# Patient Record
Sex: Female | Born: 1975 | Race: Black or African American | Hispanic: No | Marital: Single | Smoking: Former smoker
Health system: Southern US, Community
[De-identification: ages and names within clinical notes are randomized; demographics above are authoritative.]

## PROBLEM LIST (undated history)

## (undated) DIAGNOSIS — E785 Hyperlipidemia, unspecified: Secondary | ICD-10-CM

## (undated) DIAGNOSIS — R202 Paresthesia of skin: Secondary | ICD-10-CM

## (undated) DIAGNOSIS — R519 Headache, unspecified: Secondary | ICD-10-CM

## (undated) DIAGNOSIS — B009 Herpesviral infection, unspecified: Secondary | ICD-10-CM

## (undated) DIAGNOSIS — R079 Chest pain, unspecified: Secondary | ICD-10-CM

## (undated) DIAGNOSIS — R5383 Other fatigue: Secondary | ICD-10-CM

## (undated) DIAGNOSIS — R002 Palpitations: Secondary | ICD-10-CM

## (undated) DIAGNOSIS — F419 Anxiety disorder, unspecified: Secondary | ICD-10-CM

## (undated) DIAGNOSIS — M25559 Pain in unspecified hip: Secondary | ICD-10-CM

## (undated) DIAGNOSIS — Z91018 Allergy to other foods: Secondary | ICD-10-CM

## (undated) DIAGNOSIS — M1990 Unspecified osteoarthritis, unspecified site: Secondary | ICD-10-CM

## (undated) DIAGNOSIS — M25569 Pain in unspecified knee: Secondary | ICD-10-CM

## (undated) DIAGNOSIS — R51 Headache: Secondary | ICD-10-CM

## (undated) DIAGNOSIS — R42 Dizziness and giddiness: Secondary | ICD-10-CM

## (undated) DIAGNOSIS — M549 Dorsalgia, unspecified: Secondary | ICD-10-CM

## (undated) HISTORY — DX: Allergy to other foods: Z91.018

## (undated) HISTORY — DX: Paresthesia of skin: R20.2

## (undated) HISTORY — DX: Palpitations: R00.2

## (undated) HISTORY — DX: Dorsalgia, unspecified: M54.9

## (undated) HISTORY — PX: NO PAST SURGERIES: SHX2092

## (undated) HISTORY — DX: Unspecified osteoarthritis, unspecified site: M19.90

## (undated) HISTORY — DX: Pain in unspecified knee: M25.569

## (undated) HISTORY — DX: Anxiety disorder, unspecified: F41.9

## (undated) HISTORY — DX: Chest pain, unspecified: R07.9

## (undated) HISTORY — DX: Pain in unspecified hip: M25.559

## (undated) HISTORY — DX: Hyperlipidemia, unspecified: E78.5

## (undated) HISTORY — DX: Headache: R51

## (undated) HISTORY — DX: Other fatigue: R53.83

## (undated) HISTORY — DX: Dizziness and giddiness: R42

## (undated) HISTORY — DX: Headache, unspecified: R51.9

---

## 2001-04-12 ENCOUNTER — Other Ambulatory Visit: Admission: RE | Admit: 2001-04-12 | Discharge: 2001-04-12 | Payer: Self-pay | Admitting: Obstetrics and Gynecology

## 2006-03-22 ENCOUNTER — Encounter (INDEPENDENT_AMBULATORY_CARE_PROVIDER_SITE_OTHER): Payer: Self-pay | Admitting: Specialist

## 2006-03-22 ENCOUNTER — Inpatient Hospital Stay (HOSPITAL_COMMUNITY): Admission: AD | Admit: 2006-03-22 | Discharge: 2006-03-22 | Payer: Self-pay | Admitting: Obstetrics and Gynecology

## 2010-02-02 NOTE — L&D Delivery Note (Signed)
Delivery Note At 6:15 PM a viable and healthy female was delivered via  (ROA).  APGAR: 9,9, ; weight .  7lb 13 oz Placenta status: ,intact, spont .  Cord:  with the following complications: .none  Cord pH: none Anesthesia:  none Episiotomy: none Lacerations: 1 st degree perineal and right periurethral Suture Repair: 3.0 chromic Est. Blood Loss (mL): 250cc  Mom to postpartum.  Baby to nursery-stable.  [name],Kathy A 10/04/2010, 6:32 PM

## 2010-02-25 ENCOUNTER — Ambulatory Visit (HOSPITAL_COMMUNITY)
Admission: RE | Admit: 2010-02-25 | Discharge: 2010-02-25 | Payer: Self-pay | Source: Home / Self Care | Attending: Obstetrics | Admitting: Obstetrics

## 2010-02-28 LAB — ABO/RH

## 2010-02-28 LAB — RPR: RPR: NONREACTIVE

## 2010-03-03 ENCOUNTER — Other Ambulatory Visit: Payer: Self-pay | Admitting: Obstetrics and Gynecology

## 2010-09-04 LAB — STREP B DNA PROBE: GBS: NEGATIVE

## 2010-10-04 ENCOUNTER — Inpatient Hospital Stay (HOSPITAL_COMMUNITY)
Admission: AD | Admit: 2010-10-04 | Discharge: 2010-10-06 | DRG: 775 | Disposition: A | Discharge status: Home / Self Care | Payer: Managed Care, Other (non HMO) | Source: Ambulatory Visit | Attending: Obstetrics and Gynecology | Admitting: Obstetrics and Gynecology

## 2010-10-04 ENCOUNTER — Encounter (HOSPITAL_COMMUNITY): Payer: Self-pay

## 2010-10-04 DIAGNOSIS — O480 Post-term pregnancy: Principal | ICD-10-CM | POA: Diagnosis present

## 2010-10-04 DIAGNOSIS — IMO0002 Reserved for concepts with insufficient information to code with codable children: Secondary | ICD-10-CM | POA: Diagnosis present

## 2010-10-04 HISTORY — DX: Herpesviral infection, unspecified: B00.9

## 2010-10-04 LAB — RPR: RPR Ser Ql: NONREACTIVE

## 2010-10-04 LAB — CBC
HCT: 35.4 % — ABNORMAL LOW (ref 36.0–46.0)
Hemoglobin: 11.8 g/dL — ABNORMAL LOW (ref 12.0–15.0)
MCHC: 33.3 g/dL (ref 30.0–36.0)
RBC: 4.23 MIL/uL (ref 3.87–5.11)

## 2010-10-04 MED ORDER — WITCH HAZEL-GLYCERIN EX PADS: 1.0000 "application " | MEDICATED_PAD | CUTANEOUS | Status: DC | PRN

## 2010-10-04 MED ORDER — OXYTOCIN BOLUS FROM INFUSION
500.0000 mL | Freq: Once | INTRAVENOUS | Status: DC
  Filled 2010-10-04: qty 500

## 2010-10-04 MED ORDER — LIDOCAINE HCL (PF) 1 % IJ SOLN
30.0000 mL | INTRAMUSCULAR | Status: DC | PRN
  Filled 2010-10-04 (×2): qty 30

## 2010-10-04 MED ORDER — OXYTOCIN 20 UNITS IN LACTATED RINGERS INFUSION - SIMPLE
1.0000 m[IU]/min | INTRAVENOUS | Status: DC
  Administered 2010-10-04: 2 m[IU]/min via INTRAVENOUS

## 2010-10-04 MED ORDER — OXYTOCIN 20 UNITS IN LACTATED RINGERS INFUSION - SIMPLE
125.0000 mL/h | Freq: Once | INTRAVENOUS | Status: DC
  Filled 2010-10-04: qty 1000

## 2010-10-04 MED ORDER — OXYTOCIN 10 UNIT/ML IJ SOLN: 10.0000 [IU] | Freq: Once | INTRAMUSCULAR | Status: DC

## 2010-10-04 MED ORDER — IBUPROFEN 600 MG PO TABS
600.0000 mg | ORAL_TABLET | Freq: Four times a day (QID) | ORAL | Status: DC | PRN
  Administered 2010-10-04: 600 mg via ORAL
  Filled 2010-10-04 (×2): qty 1

## 2010-10-04 MED ORDER — OXYCODONE-ACETAMINOPHEN 5-325 MG PO TABS: 2.0000 | ORAL_TABLET | ORAL | Status: DC | PRN

## 2010-10-04 MED ORDER — IBUPROFEN 600 MG PO TABS
600.0000 mg | ORAL_TABLET | Freq: Four times a day (QID) | ORAL | Status: DC
  Administered 2010-10-05 – 2010-10-06 (×6): 600 mg via ORAL
  Filled 2010-10-04 (×5): qty 1

## 2010-10-04 MED ORDER — BENZOCAINE-MENTHOL 20-0.5 % EX AERO
1.0000 "application " | INHALATION_SPRAY | CUTANEOUS | Status: DC | PRN
  Administered 2010-10-05: 1 via TOPICAL

## 2010-10-04 MED ORDER — CITRIC ACID-SODIUM CITRATE 334-500 MG/5ML PO SOLN: 30.0000 mL | ORAL | Status: DC | PRN

## 2010-10-04 MED ORDER — NALBUPHINE SYRINGE 5 MG/0.5 ML
10.0000 mg | INJECTION | INTRAMUSCULAR | Status: DC | PRN
  Filled 2010-10-04: qty 1

## 2010-10-04 MED ORDER — DIPHENHYDRAMINE HCL 25 MG PO CAPS: 25.0000 mg | ORAL_CAPSULE | Freq: Four times a day (QID) | ORAL | Status: DC | PRN

## 2010-10-04 MED ORDER — LACTATED RINGERS IV SOLN
INTRAVENOUS | Status: DC
  Administered 2010-10-04: 15:00:00 via INTRAVENOUS

## 2010-10-04 MED ORDER — MISOPROSTOL 25 MCG QUARTER TABLET: 25.0000 ug | ORAL_TABLET | ORAL | Status: DC | PRN

## 2010-10-04 MED ORDER — ZOLPIDEM TARTRATE 10 MG PO TABS: 10.0000 mg | ORAL_TABLET | Freq: Every evening | ORAL | Status: DC | PRN

## 2010-10-04 MED ORDER — ONDANSETRON HCL 4 MG/2ML IJ SOLN: 4.0000 mg | Freq: Four times a day (QID) | INTRAMUSCULAR | Status: DC | PRN

## 2010-10-04 MED ORDER — ONDANSETRON HCL 4 MG/2ML IJ SOLN: 4.0000 mg | INTRAMUSCULAR | Status: DC | PRN

## 2010-10-04 MED ORDER — SIMETHICONE 80 MG PO CHEW: 80.0000 mg | CHEWABLE_TABLET | ORAL | Status: DC | PRN

## 2010-10-04 MED ORDER — PRENATAL PLUS 27-1 MG PO TABS
1.0000 | ORAL_TABLET | Freq: Every day | ORAL | Status: DC
  Administered 2010-10-05: 1 via ORAL
  Filled 2010-10-04: qty 1

## 2010-10-04 MED ORDER — TERBUTALINE SULFATE 1 MG/ML IJ SOLN: 0.2500 mg | Freq: Once | INTRAMUSCULAR | Status: AC | PRN

## 2010-10-04 MED ORDER — PRENATAL PLUS 27-1 MG PO TABS: 1.0000 | ORAL_TABLET | Freq: Every day | ORAL | Status: DC

## 2010-10-04 MED ORDER — ACETAMINOPHEN 325 MG PO TABS: 650.0000 mg | ORAL_TABLET | ORAL | Status: DC | PRN

## 2010-10-04 MED ORDER — ONDANSETRON HCL 4 MG PO TABS: 4.0000 mg | ORAL_TABLET | ORAL | Status: DC | PRN

## 2010-10-04 MED ORDER — SENNOSIDES-DOCUSATE SODIUM 8.6-50 MG PO TABS
2.0000 | ORAL_TABLET | Freq: Every day | ORAL | Status: DC
  Administered 2010-10-04 – 2010-10-05 (×2): 2 via ORAL

## 2010-10-04 MED ORDER — OXYCODONE-ACETAMINOPHEN 5-325 MG PO TABS: 1.0000 | ORAL_TABLET | ORAL | Status: DC | PRN

## 2010-10-04 MED ORDER — DIBUCAINE 1 % RE OINT: 1.0000 "application " | TOPICAL_OINTMENT | RECTAL | Status: DC | PRN

## 2010-10-04 MED ORDER — LANOLIN HYDROUS EX OINT: TOPICAL_OINTMENT | CUTANEOUS | Status: DC | PRN

## 2010-10-04 MED ORDER — LACTATED RINGERS IV SOLN
500.0000 mL | INTRAVENOUS | Status: DC | PRN
Start: 2010-10-04 — End: 2010-10-05

## 2010-10-04 NOTE — Progress Notes (Signed)
Pt presents to [facility]U with complaints of ROM, 0615- Clear fluid.

## 2010-10-04 NOTE — H&P (Signed)
Kathy [name] is a 35 y.o. female presenting for admission due to SROM @ 6:15 am clear fluid and postdates. PNC uncomplicated. 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 HSV (herpes simplex virus) infection    Past Surgical History  Procedure Date  . No past surgeries    Family History: family history includes Cancer in her maternal grandfather, maternal grandmother, and mother and Diabetes in her maternal grandfather, maternal grandmother, paternal grandfather, and paternal grandmother. Social History:  reports that she has quit smoking. She has never used smokeless tobacco. She reports that she does not drink alcohol or use illicit drugs.  Review of Systems  Constitutional: Negative.   Genitourinary: Negative.   Neurological: Negative.   All other systems reviewed and are negative.    Dilation: 2 Effacement (%): 50;Thick Station: -2 Exam by:: J. Rasch RN  Blood pressure 125/84, pulse 79, temperature 97.8 F (36.6 C), temperature source Oral, resp. rate 18, height 5\' 5"  (1.651 m), weight 94.348 kg (208 lb). Maternal Exam:  Uterine Assessment: Contraction strength is mild.  Contraction frequency is irregular.   Abdomen: Estimated fetal weight is 8 1/2 lb.   Fetal presentation: vertex     Fetal Exam Fetal Monitor Review: Mode: ultrasound.   Variability: minimal (<5 bpm).   Pattern: accelerations present and no decelerations.    Fetal State Assessment: Category I - tracings are normal.     Physical Exam  Constitutional: She is oriented to person, place, and time. She appears well-developed and well-nourished.  HENT:  Head: Normocephalic.  Eyes: EOM are normal.  Neck: Neck supple.  Cardiovascular: Regular rhythm.   Respiratory: Breath sounds normal.  GI: Soft.  Musculoskeletal: She  exhibits edema.       1 +  Neurological: She is alert and oriented to person, place, and time.  Skin: Skin is warm and dry.  Psychiatric: She has a normal mood and affect.    Prenatal labs: ABO, Rh: O/Positive/-- (01/27 0000) Antibody: Negative (01/27 0000) Rubella:  Immune RPR: Nonreactive (01/27 0000)  HBsAg:   neg HIV: Non-reactive (01/27 0000)  GBS: Negative (08/02 0000)   Assessment/Plan: SROM Postdates P) Admit  Routine labs epidural prn  Low dose pitocin   COUSINS,SHERONETTE A 10/04/2010, 11:14 AM

## 2010-10-04 NOTE — Progress Notes (Signed)
Kathy [name] is a 35 y.o. [phone] at [redacted]w[redacted]d by ultrasound admitted for rupture of membranes  Subjective:   Objective: BP 113/63  Pulse 94  Temp(Src) 98.6 F (37 C) (Oral)  Resp 18  Ht 5\' 5"  (1.651 m)  Wt 94.348 kg (208 lb)  BMI 34.61 kg/m2  SpO2 100%      FHT:  FHR: 145 bpm, variability: moderate,  accelerations:  Present,  decelerations:  Absent UC:   regular, every 2 minutes SVE:   Dilation: 9 Effacement (%): 100 Station: +1 Exam by:: Thomasene Lot, RN  Labs: Lab Results  Component Value Date   WBC 12.2* 10/04/2010   HGB 11.8* 10/04/2010   HCT 35.4* 10/04/2010   MCV 83.7 10/04/2010   PLT 172 10/04/2010    Assessment / Plan: Spontaneous labor, progressing normally  Labor: Progressing normally and urge to push Preeclampsia:  no signs or symptoms of toxicity Fetal Wellbeing:  Category I Pain Control:  Labor support without medications I/D:  n/a Anticipated MOD:  NSVD  Denver Harder A 10/04/2010, 5:29 PM

## 2010-10-04 NOTE — Progress Notes (Signed)
Woke up and was a little wet went to bathroom and fluid kept coming out with mucus discharge kept dripping, when stood up more came out, no pain.

## 2010-10-04 NOTE — Progress Notes (Signed)
Kathy [name] is [name] 35 y.o. [phone] at [redacted]w[redacted]d by ultrasound admitted for rupture of membranes  Subjective:   Objective: BP 123/77  Pulse 87  Temp(Src) 98.6 F (37 C) (Oral)  Resp 18  Ht 5\' 5"  (1.651 m)  Wt 94.348 kg (208 lb)  BMI 34.61 kg/m2  SpO2 100%      FHT:  FHR: 140's bpm, variability: moderate,  accelerations:  Present,  decelerations:  Absent UC:   regular, every 2 minutes SVE:   Dilation: 4 Effacement (%): 80 Station: -2 Exam by:: Dr. Cherly [name]  Labs: Lab Results  Component Value Date   WBC 12.2* 10/04/2010   HGB 11.8* 10/04/2010   HCT 35.4* 10/04/2010   MCV 83.7 10/04/2010   PLT 172 10/04/2010    Assessment / Plan: Spontaneous labor, progressing normally  Labor: Progressing normally Preeclampsia:  no signs or symptoms of toxicity Fetal Wellbeing:  Category I Pain Control:  Labor support without medications I/D:  n/[name] Anticipated MOD:  NSVD  Kathy [name] 10/04/2010, 3:52 PM

## 2010-10-04 NOTE — Progress Notes (Signed)
FHR not tracing at interveals while pt up walking.  Whenever pt returns to room FHR begins tracing

## 2010-10-04 NOTE — Progress Notes (Signed)
EFM not tracing well due to pt walking .

## 2010-10-05 ENCOUNTER — Encounter (HOSPITAL_COMMUNITY): Payer: Self-pay

## 2010-10-05 LAB — CBC
Hemoglobin: 11.1 g/dL — ABNORMAL LOW (ref 12.0–15.0)
MCV: 83.5 fL (ref 78.0–100.0)
Platelets: 152 10*3/uL (ref 150–400)
RBC: 3.94 MIL/uL (ref 3.87–5.11)
WBC: 22.1 10*3/uL — ABNORMAL HIGH (ref 4.0–10.5)

## 2010-10-05 MED ORDER — BENZOCAINE-MENTHOL 20-0.5 % EX AERO
INHALATION_SPRAY | CUTANEOUS | Status: AC
  Administered 2010-10-05: 1 via TOPICAL
  Filled 2010-10-05: qty 56

## 2010-10-05 NOTE — Progress Notes (Signed)
Post Partum Day #1             Subjective:   Pt reports feeling well, up to shower this AM/ Tolerating po/ Voiding without problems/ No n/v/ Bleeding is moderate/ Pain controlled with ibuprofen, breastfeeding well  Newborn info:  Information for the patient's newborn:  Lillyonna, Armstead [[id number]]  female    Objective:     VS: Blood pressure 116/73, pulse 69, temperature 98.4 F (36.9 C), temperature source Oral, resp. rate 18, height 5\' 5"  (1.651 m), weight 208 lb (94.348 kg), SpO2 100.00%, currently breastfeeding.   Intake/Output Summary (Last 24 hours) at 10/05/10 1008 Last data filed at 10/04/10 1905  Gross per 24 hour  Intake      0 ml  Output    300 ml  Net   -300 ml        Basename 10/05/10 0515 10/04/10 0900  WBC 22.1* 12.2*  HGB 11.1* 11.8*  HCT 32.9* 35.4*  PLT 152 172     Blood type: O/Positive/-- (01/27 0000)  Rubella: Immune (01/27 0000)     Physical Exam:   A & O x 3 NAD   Lungs: CTAB  Heart: RR  Abdomen: soft, non-tender, FF @ U  Perineum: repair intact, edema mild  Lochia: small  Extremities: neg Homans', edema none    Assessment/Plan:  PPD # 1 / 35 y.o., [id number] S/P:spontaneous vaginal    normal postpartum exam  Doing well  Continue routine post partum orders  Anticipate d/c in AM, may decide to go home this eve if infant discharged         LOS: 1 day   Avrey Hyser, CNM, MSN 10/05/2010, 10:08 AM

## 2010-10-06 MED ORDER — TETANUS-DIPHTH-ACELL PERTUSSIS 5-2.5-18.5 LF-MCG/0.5 IM SUSP
0.5000 mL | Freq: Once | INTRAMUSCULAR | Status: AC
  Administered 2010-10-06: 0.5 mL via INTRAMUSCULAR

## 2010-10-06 MED ORDER — IBUPROFEN 600 MG PO TABS: 600.0000 mg | ORAL_TABLET | Freq: Four times a day (QID) | ORAL | Status: AC

## 2010-10-06 NOTE — Discharge Summary (Signed)
Obstetric Discharge Summary Reason for Admission: onset of labor and rupture of membranes Prenatal Procedures: ultrasound Intrapartum Procedures: spontaneous vaginal delivery Postpartum Procedures: Tdap Complications-Operative and Postpartum: 1st degree perineal laceration and periurethral Hemoglobin  Date Value Range Status  10/05/2010 11.1* 12.0-15.0 (g/dL) Final     HCT  Date Value Range Status  10/05/2010 32.9* 36.0-46.0 (%) Final    Discharge Diagnoses: Term Pregnancy-delivered  Discharge Information: Date: 10/06/2010 Activity: pelvic rest Diet: routine Medications: Ibuprophen Condition: stable Instructions: refer to practice specific booklet Discharge to: home Follow-up Information    Follow up with Makaleigh Reinard A, MD in 6 weeks.   Contact information:   [address] [place] [address] [phone]          Newborn Data: Live born female  Birth Weight: 7 lb 13.2 oz (3549 g) APGAR: 9, 9  Home with mother.  Kathy [name],Kathy [name] 10/06/2010, 9:14 AM

## 2010-10-06 NOTE — Progress Notes (Signed)
Post Partum Day #2           Information for the patient's newborn:  Alaney, Witter [[id number]]  female    Subjective: No HA, SOB, CP, F/C, breast symptoms. Pain minimal. Normal vaginal bleeding, no clots.    Feeding: breast  Objective: BP 108/72  Pulse 66  Temp(Src) 98.2 F (36.8 C) (Oral)  Resp 18  Ht 5\' 5"  (1.651 m)  Wt 208 lb (94.348 kg)  BMI 34.61 kg/m2  SpO2 100%  Breastfeeding? Unknown  No intake or output data in the 24 hours ending 10/06/10 0912     Basename 10/05/10 0515 10/04/10 0900  WBC 22.1* 12.2*  HGB 11.1* 11.8*  HCT 32.9* 35.4*  PLT 152 172    Blood type: O/Positive/-- (01/27 0000) Rubella: Immune (01/27 0000)    Physical Exam:  General: alert, cooperative and no distress Uterine Fundus: firm Lochia: appropriate Perineum: repair intact, edema none DVT Evaluation: Negative Homan's sign. No significant calf/ankle edema.    Assessment/Plan: PPD # 2 / 35 y.o., [id number] S/P:spontaneous vaginal    normal postpartum exam  Continue current postpartum care  D/C home   LOS: 2 days   Blakleigh Straw, CNM, MSN 10/06/2010, 9:12 AM

## 2010-10-07 NOTE — Progress Notes (Signed)
UR chart review completed.  

## 2013-12-04 ENCOUNTER — Encounter (HOSPITAL_COMMUNITY): Payer: Self-pay

## 2014-08-12 ENCOUNTER — Ambulatory Visit (INDEPENDENT_AMBULATORY_CARE_PROVIDER_SITE_OTHER): Payer: BLUE CROSS/BLUE SHIELD | Admitting: Physician Assistant

## 2014-08-12 VITALS — BP 100/58 | HR 72 | Temp 98.6°F | Resp 14 | Ht 65.25 in | Wt 195.0 lb

## 2014-08-12 DIAGNOSIS — J329 Chronic sinusitis, unspecified: Secondary | ICD-10-CM

## 2014-08-12 DIAGNOSIS — R21 Rash and other nonspecific skin eruption: Secondary | ICD-10-CM

## 2014-08-12 DIAGNOSIS — M549 Dorsalgia, unspecified: Secondary | ICD-10-CM

## 2014-08-12 LAB — POCT URINALYSIS DIPSTICK
Bilirubin, UA: NEGATIVE
Glucose, UA: NEGATIVE
Ketones, UA: NEGATIVE
LEUKOCYTES UA: NEGATIVE
NITRITE UA: NEGATIVE
PROTEIN UA: NEGATIVE
Spec Grav, UA: 1.015
Urobilinogen, UA: 0.2
pH, UA: 7

## 2014-08-12 LAB — POCT UA - MICROSCOPIC ONLY
CASTS, UR, LPF, POC: NEGATIVE
Crystals, Ur, HPF, POC: NEGATIVE
Mucus, UA: NEGATIVE
YEAST UA: NEGATIVE

## 2014-08-12 MED ORDER — AMOXICILLIN-POT CLAVULANATE 875-125 MG PO TABS: 1.0000 | ORAL_TABLET | Freq: Two times a day (BID) | ORAL | Status: AC

## 2014-08-12 MED ORDER — CETIRIZINE HCL 10 MG PO TABS: 10.0000 mg | ORAL_TABLET | Freq: Every day | ORAL | Status: DC

## 2014-08-12 NOTE — Patient Instructions (Addendum)
Please use over the counter nasal saline for the nostrils once per day. Please take ibuprofen or tylenol for your back pain.

## 2014-08-12 NOTE — Progress Notes (Signed)
[facility] [address], [place] [address] [phone]  Date:  08/12/2014   Name:  Kathy [name]   DOB:  February 13, 1975   MRN:  [id number]  PCP:  No primary care provider on file.    History of Present Illness:  Kathy [name] is a 39 y.o. female patient who present to [facility] 6 days ago of sneezing.  She thought she was getting better, but 5 days later she developed rhinorrhea and malaise, and nasal  pressure.  There is throat pain with right side.  She constantly feels fatigue.  She has a slight non-productive cough, but not consistent.  She states that she will blow her nose with a thick yellow and bloody mucus.  She denies fever, but has been having fits of fever.  She has no nausea, vomiting, or diarrhea.   She also complains of swelling at the back of her mouth intermittently.  It is not painful today.  She states that she thinks she gets stones sometimes.  She has no dyspnea, or sob.     She also complains that she is having symptoms of mid back pain along both sides.  She denies dysuria, frequency, or hematuria.  She has taken vitamin C and multivitamins.      There are no active problems to display for this patient.   Past Medical History  Diagnosis Date  . HSV (herpes simplex virus) infection   . Postpartum care following vaginal delivery 10/05/2010    Past Surgical History  Procedure Laterality Date  . No past surgeries      History  Substance Use Topics  . Smoking status: Former Smoker -- 12 years  . Smokeless tobacco: Never Used  . Alcohol Use: No    Family History  Problem Relation Age of Onset  . Cancer Mother   . Cancer Maternal Grandmother   . Diabetes Maternal Grandmother   . Diabetes Maternal Grandfather   . Cancer Maternal Grandfather   . Diabetes Paternal Grandmother   . Diabetes Paternal Grandfather     No Known Allergies  Medication list has been reviewed and updated.  Current Outpatient Prescriptions on File Prior to  Visit  Medication Sig Dispense Refill  . prenatal vitamin w/FE, FA (PRENATAL 1 + 1) 27-1 MG TABS Take 1 tablet by mouth daily.       No current facility-administered medications on file prior to visit.    ROS ROS otherwise unremarkable unless listed above.   Physical Examination: BP 100/58 mmHg  Pulse 72  Temp(Src) 98.6 F (37 C) (Oral)  Resp 14  Ht 5' 5.25" (1.657 m)  Wt 195 lb (88.451 kg)  BMI 32.21 kg/m2  SpO2 99%  LMP 07/19/2014 Ideal Body Weight: Weight in (lb) to have BMI = 25: 151.1  Physical Exam  Constitutional: She appears well-developed and well-nourished. No distress.  HENT:  Head: Normocephalic and atraumatic.  Right Ear: Tympanic membrane, external ear and ear canal normal.  Left Ear: Tympanic membrane, external ear and ear canal normal.  Nose: Mucosal edema (Right nostril only) and rhinorrhea (Right nostril with rhinorrhea, and dried mucus along nasal ridge.  ) present. Right sinus exhibits no maxillary sinus tenderness and no frontal sinus tenderness. Left sinus exhibits no maxillary sinus tenderness and no frontal sinus tenderness.  Mouth/Throat: No oropharyngeal exudate or posterior oropharyngeal edema.  No erythema.  Debri seen at upper right salivary duct (hamulus), however no erythema present.    Eyes: Pupils are  equal, round, and reactive to light.  Skin: She is not diaphoretic.  Scant small raised erthematous patches, which appear more gathered near right nostril.  Right nostril dry with peeling.      Results for orders placed or performed in visit on 08/12/14  POCT UA - Microscopic Only  Result Value Ref Range   WBC, Ur, HPF, POC 0-3    RBC, urine, microscopic 0-3    Bacteria, U Microscopic 3+    Mucus, UA negative    Epithelial cells, urine per micros 5-10    Crystals, Ur, HPF, POC negative    Casts, Ur, LPF, POC negative    Yeast, UA negative   POCT urinalysis dipstick  Result Value Ref Range   Color, UA yellow    Clarity, UA clear     Glucose, UA negative    Bilirubin, UA negative    Ketones, UA negative    Spec Grav, UA 1.015    Blood, UA trace-lysed    pH, UA 7.0    Protein, UA negative    Urobilinogen, UA 0.2    Nitrite, UA negative    Leukocytes, UA Negative Negative    Assessment and Plan: 39 year old female is here today for right sinus pressure, right sided throat, back pain.  Symptoms appear similar to sinusitis likely at ethmoid.  Given augmentin and zyrtec.  Back pain likely with sleeping situation.  Advised tylenol for back pain.  Urine is normal at this time.  Advised to use tart candies such as lemon drops, to help clear salivary glands.    Rash and nonspecific skin eruption - Plan: cetirizine (ZYRTEC) 10 MG tablet, amoxicillin-clavulanate (AUGMENTIN) 875-125 MG per tablet  Sinusitis, unspecified chronicity, unspecified location - Plan: amoxicillin-clavulanate (AUGMENTIN) 875-125 MG per tablet  Mid back pain - Plan: POCT UA - Microscopic Only, POCT urinalysis dipstick  Trena PlattStephanie English, PA-C [facility] 7/10/20163:42 PM

## 2014-11-01 ENCOUNTER — Telehealth: Payer: Self-pay | Admitting: Behavioral Health

## 2014-11-01 NOTE — Telephone Encounter (Signed)
Unable to reach patient at time of Pre-Visit Call.  Left message for patient to return call when available.    

## 2014-11-02 ENCOUNTER — Encounter: Payer: Self-pay | Admitting: Family Medicine

## 2014-11-02 ENCOUNTER — Ambulatory Visit (INDEPENDENT_AMBULATORY_CARE_PROVIDER_SITE_OTHER): Payer: BLUE CROSS/BLUE SHIELD | Admitting: Family Medicine

## 2014-11-02 VITALS — BP 114/60 | HR 68 | Temp 99.1°F | Ht 65.0 in | Wt 196.0 lb

## 2014-11-02 DIAGNOSIS — Z8619 Personal history of other infectious and parasitic diseases: Secondary | ICD-10-CM

## 2014-11-02 DIAGNOSIS — Z0000 Encounter for general adult medical examination without abnormal findings: Secondary | ICD-10-CM | POA: Diagnosis not present

## 2014-11-02 DIAGNOSIS — R51 Headache: Secondary | ICD-10-CM

## 2014-11-02 DIAGNOSIS — R519 Headache, unspecified: Secondary | ICD-10-CM

## 2014-11-02 LAB — LIPID PANEL
CHOLESTEROL: 156 mg/dL (ref 125–200)
HDL: 49 mg/dL (ref >=46)
LDL CALC: 90 mg/dL (ref <130)
TRIGLYCERIDES: 87 mg/dL (ref <150)
Total CHOL/HDL Ratio: 3.2 Ratio (ref <=5.0)
VLDL: 17 mg/dL (ref <30)

## 2014-11-02 LAB — CBC WITH DIFFERENTIAL/PLATELET
BASOS PCT: 0 % (ref 0–1)
Basophils Absolute: 0 10*3/uL (ref 0.0–0.1)
EOS ABS: 0.1 10*3/uL (ref 0.0–0.7)
Eosinophils Relative: 1 % (ref 0–5)
HCT: 38.3 % (ref 36.0–46.0)
HEMOGLOBIN: 12.6 g/dL (ref 12.0–15.0)
Lymphocytes Relative: 37 % (ref 12–46)
Lymphs Abs: 2.6 10*3/uL (ref 0.7–4.0)
MCH: 27 pg (ref 26.0–34.0)
MCHC: 32.9 g/dL (ref 30.0–36.0)
MCV: 82.2 fL (ref 78.0–100.0)
MPV: 11.5 fL (ref 8.6–12.4)
Monocytes Absolute: 0.4 10*3/uL (ref 0.1–1.0)
Monocytes Relative: 5 % (ref 3–12)
NEUTROS ABS: 4 10*3/uL (ref 1.7–7.7)
NEUTROS PCT: 57 % (ref 43–77)
PLATELETS: 206 10*3/uL (ref 150–400)
RBC: 4.66 MIL/uL (ref 3.87–5.11)
RDW: 14.3 % (ref 11.5–15.5)
WBC: 7.1 10*3/uL (ref 4.0–10.5)

## 2014-11-02 LAB — COMPREHENSIVE METABOLIC PANEL
ALBUMIN: 4.2 g/dL (ref 3.6–5.1)
ALT: 15 U/L (ref 6–29)
AST: 17 U/L (ref 10–30)
Alkaline Phosphatase: 28 U/L — ABNORMAL LOW (ref 33–115)
BILIRUBIN TOTAL: 0.4 mg/dL (ref 0.2–1.2)
BUN: 10 mg/dL (ref 7–25)
CHLORIDE: 103 mmol/L (ref 98–110)
CO2: 25 mmol/L (ref 20–31)
CREATININE: 0.78 mg/dL (ref 0.50–1.10)
Calcium: 8.9 mg/dL (ref 8.6–10.2)
Glucose, Bld: 87 mg/dL (ref 65–99)
Potassium: 4.4 mmol/L (ref 3.5–5.3)
SODIUM: 137 mmol/L (ref 135–146)
TOTAL PROTEIN: 6.9 g/dL (ref 6.1–8.1)

## 2014-11-02 LAB — SEDIMENTATION RATE: SED RATE: 4 mm/h (ref 0–20)

## 2014-11-02 MED ORDER — ZOLMITRIPTAN 5 MG PO TBDP: 5.0000 mg | ORAL_TABLET | ORAL | Status: DC | PRN

## 2014-11-02 NOTE — Progress Notes (Signed)
Pre visit review using our clinic review tool, if applicable. No additional management support is needed unless otherwise documented below in the visit note. 

## 2014-11-02 NOTE — Patient Instructions (Signed)

## 2014-11-02 NOTE — Progress Notes (Signed)
Patient ID: Kathy [name], female   DOB: [birth year], 39 y.o.   MRN: [id number]   Subjective:    Patient ID: Kathy [name], female    DOB: [birth year], 39 y.o.   MRN: [id number]  Chief Complaint  Patient presents with  . Establish Care    c/o Migraines with visual disturbance that started x's 1 mo ago.  Marland Kitchen Headache    and Dizziness while driving on 1/63, 8/46 and 9/18  . Tachycardia    and palpitaions. She had a previous work up.   Aldine Contes TRANSMITTED DISEASE    c/o having 2 false positive Test and she is concerned    HPI Patient is in today to establish/,    She had dizzy spells 10, 14, 18 of September and headaches on 5 and 2 other minor ones .    2 dizzy spells while driving and  headache on Tuesday that lasted from am - 4pm------and it went away on its own.  Pt is doing a fast with church so she thinks that particular headache was due to that.   Her vision is good with contacts.  Pt sometimes has a cough and sometimes her nose drips.  Headaches are usually on top of head left side.  Sometimes she takes tylenol and it helps-- when she had the migraine she saw a worm light on r side of vision.      Past Medical History  Diagnosis Date  . HSV (herpes simplex virus) infection   . Postpartum care following vaginal delivery 10/05/2010    Past Surgical History  Procedure Laterality Date  . No past surgeries      Family History  Problem Relation Age of Onset  . Lymphoma Mother   . Breast cancer Maternal Grandmother   . Diabetes Maternal Grandmother   . Diabetes Maternal Grandfather   . Cancer Maternal Grandfather     Unsure if Prostate or colon  . Diabetes Paternal Grandmother   . Diabetes Paternal Grandfather     Social History   Social History  . Marital Status: Single    Spouse Name: N/A  . Number of Children: N/A  . Years of Education: N/A   Occupational History  . Not on file.   Social History Main Topics  . Smoking status: Former Smoker -- 12 years  .  Smokeless tobacco: Never Used  . Alcohol Use: No  . Drug Use: No  . Sexual Activity: Yes   Other Topics Concern  . Not on file   Social History Narrative    Outpatient Prescriptions Prior to Visit  Medication Sig Dispense Refill  . norgestimate-ethinyl estradiol (MONONESSA) 0.25-35 MG-MCG tablet Take 1 tablet by mouth daily.    . valACYclovir (VALTREX) 500 MG tablet Take 500 mg by mouth daily.    . cetirizine (ZYRTEC) 10 MG tablet Take 1 tablet (10 mg total) by mouth daily. 30 tablet 11  . prenatal vitamin w/FE, FA (PRENATAL 1 + 1) 27-1 MG TABS Take 1 tablet by mouth daily.       No facility-administered medications prior to visit.    No Known Allergies  Review of Systems  Constitutional: Negative for fever, chills and malaise/fatigue.  HENT: Negative for congestion and hearing loss.   Eyes: Negative for discharge.  Respiratory: Negative for cough, sputum production and shortness of breath.   Cardiovascular: Negative for chest pain, palpitations and leg swelling.  Gastrointestinal: Negative for heartburn, nausea, vomiting, abdominal pain, diarrhea, constipation and blood in stool.  Genitourinary: Negative for dysuria, urgency, frequency and hematuria.  Musculoskeletal: Negative for myalgias, back pain and falls.  Skin: Negative for rash.  Neurological: Negative for dizziness, sensory change, loss of consciousness, weakness and headaches.  Endo/Heme/Allergies: Negative for environmental allergies. Does not bruise/bleed easily.  Psychiatric/Behavioral: Negative for depression and suicidal ideas. The patient is not nervous/anxious and does not have insomnia.        Objective:    Physical Exam  Constitutional: She is oriented to person, place, and time. She appears well-developed and well-nourished.  HENT:  Head: Normocephalic and atraumatic.  Eyes: Conjunctivae and EOM are normal.  Neck: Normal range of motion. Neck supple. No JVD present. Carotid bruit is not present. No  thyromegaly present.  Cardiovascular: Normal rate, regular rhythm and normal heart sounds.   No murmur heard. Pulmonary/Chest: Effort normal and breath sounds normal. No respiratory distress. She has no wheezes. She has no rales. She exhibits no tenderness.  Musculoskeletal: She exhibits no edema.  Neurological: She is alert and oriented to person, place, and time.  Psychiatric: She has a normal mood and affect.  Nursing note and vitals reviewed.   BP 114/60 mmHg  Pulse 68  Temp(Src) 99.1 F (37.3 C) (Oral)  Ht $R'5\' 5"'jy$  (1.651 m)  Wt 196 lb (88.905 kg)  BMI 32.62 kg/m2  SpO2 99%  LMP 10/10/2014 Wt Readings from Last 3 Encounters:  11/02/14 196 lb (88.905 kg)  08/12/14 195 lb (88.451 kg)  10/04/10 208 lb (94.348 kg)     Lab Results  Component Value Date   WBC 7.1 11/02/2014   HGB 12.6 11/02/2014   HCT 38.3 11/02/2014   PLT 206 11/02/2014    No results found for: TSH Lab Results  Component Value Date   WBC 7.1 11/02/2014   HGB 12.6 11/02/2014   HCT 38.3 11/02/2014   MCV 82.2 11/02/2014   PLT 206 11/02/2014   No results found for: NA, K, CHLORIDE, CO2, GLUCOSE, BUN, CREATININE, BILITOT, ALKPHOS, AST, ALT, PROT, ALBUMIN, CALCIUM, ANIONGAP, EGFR, GFR No results found for: CHOL No results found for: HDL No results found for: LDLCALC No results found for: TRIG No results found for: CHOLHDL No results found for: HGBA1C     Assessment & Plan:   Problem List Items Addressed This Visit    None    Visit Diagnoses    Preventative health care    -  Primary    Relevant Orders    Vitamin B12 (Completed)    CBC w/Diff (Completed)    Comp Met (CMET) (Completed)    Lipid Profile (Completed)    TSH (Completed)    ANA (Completed)    RPR (Completed)    Sed Rate (ESR) (Completed)    History of positive serological reaction for syphilis        Relevant Orders    Vitamin B12 (Completed)    CBC w/Diff (Completed)    Comp Met (CMET) (Completed)    Lipid Profile (Completed)     TSH (Completed)    ANA (Completed)    RPR (Completed)    Sed Rate (ESR) (Completed)    Nonintractable headache, unspecified chronicity pattern, unspecified headache type        Relevant Medications    zolmitriptan (ZOMIG-ZMT) 5 MG disintegrating tablet    Other Relevant Orders    MR Angiogram Head Wo Contrast    Vitamin B12 (Completed)    CBC w/Diff (Completed)    Comp Met (CMET) (Completed)    Lipid Profile (  Completed)    TSH (Completed)    ANA (Completed)    RPR (Completed)    Sed Rate (ESR) (Completed)       I have discontinued Ms. Fleck's (prenatal vitamin w/FE, FA) and cetirizine. I am also having her start on zolmitriptan. Additionally, I am having her maintain her valACYclovir and norgestimate-ethinyl estradiol.  Meds ordered this encounter  Medications  . zolmitriptan (ZOMIG-ZMT) 5 MG disintegrating tablet    Sig: Take 1 tablet (5 mg total) by mouth as needed for migraine.    Dispense:  10 tablet    Refill:  0     Garnet Koyanagi, DO

## 2014-11-03 LAB — RPR

## 2014-11-03 LAB — TSH: TSH: 1.297 u[IU]/mL (ref 0.350–4.500)

## 2014-11-03 LAB — VITAMIN B12: VITAMIN B 12: 433 pg/mL (ref 211–911)

## 2014-11-05 LAB — ANA: ANA: NEGATIVE

## 2014-11-10 ENCOUNTER — Ambulatory Visit (HOSPITAL_BASED_OUTPATIENT_CLINIC_OR_DEPARTMENT_OTHER)
Admission: RE | Admit: 2014-11-10 | Discharge: 2014-11-10 | Disposition: A | Discharge status: Home / Self Care | Payer: BLUE CROSS/BLUE SHIELD | Source: Ambulatory Visit | Attending: Family Medicine | Admitting: Family Medicine

## 2014-11-10 DIAGNOSIS — R42 Dizziness and giddiness: Secondary | ICD-10-CM | POA: Diagnosis not present

## 2014-11-10 DIAGNOSIS — G43909 Migraine, unspecified, not intractable, without status migrainosus: Secondary | ICD-10-CM | POA: Insufficient documentation

## 2014-11-10 DIAGNOSIS — R51 Headache: Secondary | ICD-10-CM

## 2014-11-10 DIAGNOSIS — R519 Headache, unspecified: Secondary | ICD-10-CM

## 2014-11-23 ENCOUNTER — Encounter: Payer: Self-pay | Admitting: Family Medicine

## 2014-11-23 ENCOUNTER — Ambulatory Visit (INDEPENDENT_AMBULATORY_CARE_PROVIDER_SITE_OTHER): Payer: BLUE CROSS/BLUE SHIELD | Admitting: Family Medicine

## 2014-11-23 VITALS — HR 80 | Temp 98.3°F | Wt 194.2 lb

## 2014-11-23 DIAGNOSIS — J208 Acute bronchitis due to other specified organisms: Secondary | ICD-10-CM

## 2014-11-23 MED ORDER — GUAIFENESIN-CODEINE 100-10 MG/5ML PO SYRP: ORAL_SOLUTION | ORAL | Status: DC

## 2014-11-23 MED ORDER — AZITHROMYCIN 250 MG PO TABS: ORAL_TABLET | ORAL | Status: DC

## 2014-11-23 NOTE — Progress Notes (Signed)
  Subjective:     Kathy [name] is a 39 y.o. female here for evaluation of a cough. Onset of symptoms was 1 week ago. Symptoms have been gradually worsening since that time. The cough is barky and productive and is aggravated by exercise and reclining position. Associated symptoms include: fever, postnasal drip, shortness of breath, sputum production and wheezing. Patient does not have a history of asthma. Patient does not have a history of environmental allergens. Patient has not traveled recently. Patient does not have a history of smoking. Patient has not had a previous chest x-ray. Patient has not had a PPD done.  The following portions of the patient's history were reviewed and updated as appropriate:  She  has a past medical history of HSV (herpes simplex virus) infection and Postpartum care following vaginal delivery (10/05/2010). She  does not have any pertinent problems on file. She  has past surgical history that includes No past surgeries. Her family history includes Breast cancer in her maternal grandmother; Cancer in her maternal grandfather; Diabetes in her maternal grandfather, maternal grandmother, paternal grandfather, and paternal grandmother; Lymphoma in her mother. She  reports that she has quit smoking. She has never used smokeless tobacco. She reports that she does not drink alcohol or use illicit drugs.  She has a current medication list which includes the following prescription(s): valacyclovir and zolmitriptan. Current Outpatient Prescriptions on File Prior to Visit  Medication Sig Dispense Refill  . valACYclovir (VALTREX) 500 MG tablet Take 500 mg by mouth daily.    Marland Kitchen. zolmitriptan (ZOMIG-ZMT) 5 MG disintegrating tablet Take 1 tablet (5 mg total) by mouth as needed for migraine. 10 tablet 0   No current facility-administered medications on file prior to visit.   She has No Known Allergies..  Review of Systems Pertinent items are noted in HPI.   Objective:    Oxygen  saturation 97% on Antibiotics per medication orders. Antitussives per medication orders. Avoid exposure to tobacco smoke and fumes. B-agonist inhaler. Call if shortness of breath worsens, blood in sputum, change in character of cough, development of fever or chills, inability to maintain nutrition and hydration. Avoid exposure to tobacco smoke and fumes. Pulse 80  Temp(Src) 98.3 F (36.8 C) (Oral)  Wt 194 lb 3.2 oz (88.089 kg)  SpO2 97%  LMP 11/06/2014  Breastfeeding? No General appearance: alert, cooperative, appears stated age and no distress Ears: normal TM's and external ear canals both ears Nose: Nares normal. Septum midline. Mucosa normal. No drainage or sinus tenderness. Throat: lips, mucosa, and tongue normal; teeth and gums normal Neck: moderate anterior cervical adenopathy, supple, symmetrical, trachea midline and thyroid not enlarged, symmetric, no tenderness/mass/nodules Lungs: diminished breath sounds bilaterally Heart: S1, S2 normal    Assessment:    Acute Bronchitis    Plan:    Antibiotics per medication orders. Antitussives per medication orders. Avoid exposure to tobacco smoke and fumes. B-agonist inhaler. Call if shortness of breath worsens, blood in sputum, change in character of cough, development of fever or chills, inability to maintain nutrition and hydration. Avoid exposure to tobacco smoke and fumes.

## 2014-11-23 NOTE — Patient Instructions (Signed)

## 2014-11-23 NOTE — Progress Notes (Signed)
Pre visit review using our clinic review tool, if applicable. No additional management support is needed unless otherwise documented below in the visit note. 

## 2014-11-26 ENCOUNTER — Ambulatory Visit: Payer: Self-pay | Admitting: Family Medicine

## 2014-11-28 ENCOUNTER — Telehealth: Payer: Self-pay | Admitting: Family Medicine

## 2014-11-28 DIAGNOSIS — R05 Cough: Secondary | ICD-10-CM

## 2014-11-28 DIAGNOSIS — R059 Cough, unspecified: Secondary | ICD-10-CM

## 2014-11-28 NOTE — Telephone Encounter (Signed)
Pt called stating that she feels much better but has a lot of chest congestion. It doesn't come up enough to spit it out. She said that a chest xray was discussed if she was not better. Pt is not sure if she needs to come in, wait it out, or have chest xray. Please call at [phone].

## 2014-11-29 ENCOUNTER — Ambulatory Visit (HOSPITAL_BASED_OUTPATIENT_CLINIC_OR_DEPARTMENT_OTHER)
Admission: RE | Admit: 2014-11-29 | Discharge: 2014-11-29 | Disposition: A | Discharge status: Home / Self Care | Payer: BLUE CROSS/BLUE SHIELD | Source: Ambulatory Visit | Attending: Family Medicine | Admitting: Family Medicine

## 2014-11-29 DIAGNOSIS — R05 Cough: Secondary | ICD-10-CM | POA: Insufficient documentation

## 2014-11-29 DIAGNOSIS — R059 Cough, unspecified: Secondary | ICD-10-CM

## 2014-11-29 NOTE — Telephone Encounter (Signed)
Can get cxr first

## 2014-11-29 NOTE — Telephone Encounter (Signed)
The patient was seen on 11/23/14 and treated with Z-Pak, still has the cough. Please advise if she needs a Cxr of another Eval.     KP

## 2014-11-29 NOTE — Telephone Encounter (Signed)
Pt called in to check the status of her message. Informed pt that someone will get back with her once advised further. She's fine with that.    CB: [phone] .

## 2014-11-29 NOTE — Telephone Encounter (Signed)
Patient aware the CXR has been order and she will come in today for the x-ray.     KP

## 2014-11-30 ENCOUNTER — Telehealth: Payer: Self-pay

## 2014-11-30 NOTE — Telephone Encounter (Signed)
-----   Message from Maia PettiesKristie S Ortiz sent at 11/30/2014  1:27 PM EDT ----- Regarding: xray results Contact: [phone] Not sure what to do since results were normal.

## 2014-11-30 NOTE — Telephone Encounter (Signed)
Msg left to call the office     KP 

## 2014-11-30 NOTE — Telephone Encounter (Signed)
Xray showed no pneumonia ---- z pack stays in system for 10 days but cough can linger for a long time after Can use mucinex or delsym If fever spikes 100.4 or higher are cough becomes productive -- call

## 2014-11-30 NOTE — Telephone Encounter (Signed)
The patient wants to know what she should do since she is still having the cough.    KP

## 2014-12-03 NOTE — Telephone Encounter (Signed)
Spoke with patient and she advised her to try the Mucinex or the Delsym for the cough and if she develops a fever or cough gets productive to let us know. She verbalized understanding. She also verbalized understanding that the Abx will be in her system for 10 days.       KP

## 2014-12-03 NOTE — Telephone Encounter (Signed)
Message left to call the office.    KP 

## 2015-04-23 ENCOUNTER — Telehealth: Payer: Self-pay | Admitting: Family Medicine

## 2015-04-23 NOTE — Telephone Encounter (Signed)
Please advise      KP 

## 2015-04-23 NOTE — Telephone Encounter (Signed)
tamiflu 75 mg qd x10 days

## 2015-04-23 NOTE — Telephone Encounter (Signed)
Caller name:Self  Can be reached: [phone] Pharmacy:  [id number] - [place], [place] - [address] [facility] [address] [phone] (Fax)         Reason for call: Patient's daughter was just tested positive for Flu B and patient wants to know if she can get something to prevent her from getting it.

## 2015-04-24 MED ORDER — OSELTAMIVIR PHOSPHATE 75 MG PO CAPS: 75.0000 mg | ORAL_CAPSULE | Freq: Every day | ORAL | Status: DC

## 2015-04-24 NOTE — Telephone Encounter (Signed)
Patent has been made aware that the RX was sent.      KP

## 2015-05-02 ENCOUNTER — Ambulatory Visit: Payer: BLUE CROSS/BLUE SHIELD | Admitting: Family Medicine

## 2015-06-18 DIAGNOSIS — E559 Vitamin D deficiency, unspecified: Secondary | ICD-10-CM | POA: Diagnosis not present

## 2015-06-18 DIAGNOSIS — Z1151 Encounter for screening for human papillomavirus (HPV): Secondary | ICD-10-CM | POA: Diagnosis not present

## 2015-06-18 DIAGNOSIS — Z113 Encounter for screening for infections with a predominantly sexual mode of transmission: Secondary | ICD-10-CM | POA: Diagnosis not present

## 2015-06-18 DIAGNOSIS — Z6834 Body mass index (BMI) 34.0-34.9, adult: Secondary | ICD-10-CM | POA: Diagnosis not present

## 2015-06-18 DIAGNOSIS — Z3202 Encounter for pregnancy test, result negative: Secondary | ICD-10-CM | POA: Diagnosis not present

## 2015-06-18 DIAGNOSIS — Z114 Encounter for screening for human immunodeficiency virus [HIV]: Secondary | ICD-10-CM | POA: Diagnosis not present

## 2015-06-18 DIAGNOSIS — Z01419 Encounter for gynecological examination (general) (routine) without abnormal findings: Secondary | ICD-10-CM | POA: Diagnosis not present

## 2015-06-18 DIAGNOSIS — Z1159 Encounter for screening for other viral diseases: Secondary | ICD-10-CM | POA: Diagnosis not present

## 2015-07-09 DIAGNOSIS — L292 Pruritus vulvae: Secondary | ICD-10-CM | POA: Diagnosis not present

## 2015-07-11 ENCOUNTER — Ambulatory Visit: Payer: BLUE CROSS/BLUE SHIELD | Admitting: Family Medicine

## 2015-07-11 ENCOUNTER — Encounter: Payer: Self-pay | Admitting: Family Medicine

## 2015-07-11 NOTE — Telephone Encounter (Signed)
Finally spoke with the patient and offered her multiple appointments between tonight and tomorrow and she declined all of them, she said she is nauseated and did not feel good. She said she would figure something out and thanked me for calling her back.   KP

## 2015-11-13 DIAGNOSIS — Z113 Encounter for screening for infections with a predominantly sexual mode of transmission: Secondary | ICD-10-CM | POA: Diagnosis not present

## 2015-11-13 DIAGNOSIS — Z3202 Encounter for pregnancy test, result negative: Secondary | ICD-10-CM | POA: Diagnosis not present

## 2015-11-13 DIAGNOSIS — N760 Acute vaginitis: Secondary | ICD-10-CM | POA: Diagnosis not present

## 2015-11-13 DIAGNOSIS — Z1159 Encounter for screening for other viral diseases: Secondary | ICD-10-CM | POA: Diagnosis not present

## 2015-11-13 DIAGNOSIS — Z114 Encounter for screening for human immunodeficiency virus [HIV]: Secondary | ICD-10-CM | POA: Diagnosis not present

## 2015-11-19 ENCOUNTER — Encounter: Payer: BLUE CROSS/BLUE SHIELD | Admitting: Family Medicine

## 2015-11-21 DIAGNOSIS — B373 Candidiasis of vulva and vagina: Secondary | ICD-10-CM | POA: Diagnosis not present

## 2015-11-26 ENCOUNTER — Encounter: Payer: Self-pay | Admitting: Family Medicine

## 2015-11-26 ENCOUNTER — Ambulatory Visit (INDEPENDENT_AMBULATORY_CARE_PROVIDER_SITE_OTHER): Payer: BLUE CROSS/BLUE SHIELD | Admitting: Family Medicine

## 2015-11-26 VITALS — BP 120/81 | HR 71 | Temp 98.1°F | Ht 65.0 in | Wt 207.0 lb

## 2015-11-26 DIAGNOSIS — Z23 Encounter for immunization: Secondary | ICD-10-CM | POA: Diagnosis not present

## 2015-11-26 DIAGNOSIS — F329 Major depressive disorder, single episode, unspecified: Secondary | ICD-10-CM

## 2015-11-26 DIAGNOSIS — F32A Depression, unspecified: Secondary | ICD-10-CM

## 2015-11-26 DIAGNOSIS — J301 Allergic rhinitis due to pollen: Secondary | ICD-10-CM | POA: Diagnosis not present

## 2015-11-26 DIAGNOSIS — R8290 Unspecified abnormal findings in urine: Secondary | ICD-10-CM | POA: Diagnosis not present

## 2015-11-26 DIAGNOSIS — Z0001 Encounter for general adult medical examination with abnormal findings: Secondary | ICD-10-CM | POA: Diagnosis not present

## 2015-11-26 DIAGNOSIS — Z0000 Encounter for general adult medical examination without abnormal findings: Secondary | ICD-10-CM

## 2015-11-26 LAB — POCT URINALYSIS DIPSTICK
BILIRUBIN UA: NEGATIVE
GLUCOSE UA: NEGATIVE
Ketones, UA: NEGATIVE
Leukocytes, UA: NEGATIVE
Nitrite, UA: NEGATIVE
Protein, UA: NEGATIVE
SPEC GRAV UA: 1.02
Urobilinogen, UA: NEGATIVE
pH, UA: 6

## 2015-11-26 LAB — COMPREHENSIVE METABOLIC PANEL
ALBUMIN: 4.3 g/dL (ref 3.5–5.2)
ALT: 17 U/L (ref 0–35)
AST: 19 U/L (ref 0–37)
Alkaline Phosphatase: 29 U/L — ABNORMAL LOW (ref 39–117)
BILIRUBIN TOTAL: 0.5 mg/dL (ref 0.2–1.2)
BUN: 12 mg/dL (ref 6–23)
CALCIUM: 9.3 mg/dL (ref 8.4–10.5)
CHLORIDE: 103 meq/L (ref 96–112)
CO2: 27 mEq/L (ref 19–32)
CREATININE: 0.84 mg/dL (ref 0.40–1.20)
GFR: 97.57 mL/min (ref >60.00)
Glucose, Bld: 93 mg/dL (ref 70–99)
Potassium: 4.1 mEq/L (ref 3.5–5.1)
Sodium: 137 mEq/L (ref 135–145)
Total Protein: 7.3 g/dL (ref 6.0–8.3)

## 2015-11-26 LAB — CBC WITH DIFFERENTIAL/PLATELET
BASOS PCT: 0.4 % (ref 0.0–3.0)
Basophils Absolute: 0 10*3/uL (ref 0.0–0.1)
EOS PCT: 0.9 % (ref 0.0–5.0)
Eosinophils Absolute: 0.1 10*3/uL (ref 0.0–0.7)
HEMATOCRIT: 41 % (ref 36.0–46.0)
Hemoglobin: 13.6 g/dL (ref 12.0–15.0)
LYMPHS PCT: 23.2 % (ref 12.0–46.0)
Lymphs Abs: 2.3 10*3/uL (ref 0.7–4.0)
MCHC: 33.1 g/dL (ref 30.0–36.0)
MCV: 82.2 fl (ref 78.0–100.0)
MONOS PCT: 5.2 % (ref 3.0–12.0)
Monocytes Absolute: 0.5 10*3/uL (ref 0.1–1.0)
NEUTROS ABS: 6.8 10*3/uL (ref 1.4–7.7)
Neutrophils Relative %: 70.3 % (ref 43.0–77.0)
PLATELETS: 215 10*3/uL (ref 150.0–400.0)
RBC: 4.99 Mil/uL (ref 3.87–5.11)
RDW: 14.1 % (ref 11.5–15.5)
WBC: 9.7 10*3/uL (ref 4.0–10.5)

## 2015-11-26 LAB — TSH: TSH: 1.46 u[IU]/mL (ref 0.35–4.50)

## 2015-11-26 LAB — LIPID PANEL
CHOLESTEROL: 170 mg/dL (ref 0–200)
HDL: 51.4 mg/dL (ref >39.00)
LDL CALC: 93 mg/dL (ref 0–99)
NonHDL: 118.71
TRIGLYCERIDES: 131 mg/dL (ref 0.0–149.0)
Total CHOL/HDL Ratio: 3
VLDL: 26.2 mg/dL (ref 0.0–40.0)

## 2015-11-26 MED ORDER — LEVOCETIRIZINE DIHYDROCHLORIDE 5 MG PO TABS: 5.0000 mg | ORAL_TABLET | Freq: Every evening | ORAL | 5 refills | Status: DC

## 2015-11-26 MED ORDER — FLUTICASONE PROPIONATE 50 MCG/ACT NA SUSP: 2.0000 | Freq: Every day | NASAL | 6 refills | Status: DC

## 2015-11-26 NOTE — Progress Notes (Signed)
Pre visit review using our clinic review tool, if applicable. No additional management support is needed unless otherwise documented below in the visit note. 

## 2015-11-26 NOTE — Progress Notes (Signed)
Subjective:     Kathy [name] is a 40 y.o. female and is here for a comprehensive physical exam. The patient reports L ear discomfort-- she said it sounds like when a speaker is about to blow.  --- some crackling and runny nose with dry cough.  She has not tried anything otc.  No productive cough.   Pt states this symptoms are on going.   No fever.  Clear drainange.     Social History   Social History  . Marital status: Single    Spouse name: N/A  . Number of children: N/A  . Years of education: N/A   Occupational History  .  [employer]   Social History Main Topics  . Smoking status: Former Smoker    Years: 12.00  . Smokeless tobacco: Never Used  . Alcohol use No  . Drug use: No  . Sexual activity: Yes    Partners: Male   Other Topics Concern  . Not on file   Social History Narrative  . No narrative on file   Health Maintenance  Topic Date Due  . INFLUENZA VACCINE  11/25/2016 (Originally 09/03/2015)  . PAP SMEAR  01/02/2017  . TETANUS/TDAP  10/05/2020  . HIV Screening  Completed    The following portions of the patient's history were reviewed and updated as appropriate:  She  has a past medical history of HSV (herpes simplex virus) infection and Postpartum care following vaginal delivery (10/05/2010). She  does not have any pertinent problems on file. She  has a past surgical history that includes No past surgeries. Her family history includes Breast cancer in her maternal grandmother; Cancer in her maternal grandfather; Diabetes in her maternal grandfather, maternal grandmother, paternal grandfather, and paternal grandmother; Lymphoma in her mother. She  reports that she has quit smoking. She quit after 12.00 years of use. She has never used smokeless tobacco. She reports that she does not drink alcohol or use drugs. She has a current medication list which includes the following prescription(s): mononessa, valacyclovir, zolmitriptan, fluticasone, and  levocetirizine. Current Outpatient Prescriptions on File Prior to Visit  Medication Sig Dispense Refill  . valACYclovir (VALTREX) 500 MG tablet Take 500 mg by mouth daily.    Marland Kitchen. zolmitriptan (ZOMIG-ZMT) 5 MG disintegrating tablet Take 1 tablet (5 mg total) by mouth as needed for migraine. 10 tablet 0   No current facility-administered medications on file prior to visit.    She has No Known Allergies..  Review of Systems Review of Systems  Constitutional: Negative for activity change, appetite change and fatigue.  HENT: Negative for hearing loss, congestion, tinnitus and ear discharge.  dentist [id number] Eyes: Negative for visual disturbance (see optho q1y -- vision corrected to 20/20 with glasses).  Respiratory: Negative for cough, chest tightness and shortness of breath.   Cardiovascular: Negative for chest pain, palpitations and leg swelling.  Gastrointestinal: Negative for abdominal pain, diarrhea, constipation and abdominal distention.  Genitourinary: Negative for urgency, frequency, decreased urine volume and difficulty urinating.  Musculoskeletal: Negative for back pain, arthralgias and gait problem.  Skin: Negative for color change, pallor and rash.  Neurological: Negative for dizziness, light-headedness, numbness and headaches.  Hematological: Negative for adenopathy. Does not bruise/bleed easily.  Psychiatric/Behavioral: Negative for suicidal ideas, confusion, sleep disturbance, self-injury, dysphoric mood, decreased concentration and agitation.       Objective:    BP 120/81 (BP Location: Left Arm, Patient Position: Sitting, Cuff Size: Normal)   Pulse 71   Temp 98.1  F (36.7 C) (Oral)   Ht 5\' 5"  (1.651 m)   Wt 207 lb (93.9 kg)   LMP 11/01/2015   SpO2 98%   BMI 34.45 kg/m  General appearance: alert, cooperative, appears stated age and no distress Head: Normocephalic, without obvious abnormality, atraumatic Eyes: conjunctivae/corneas clear. PERRL, EOM's intact. Fundi  benign. Ears: normal TM's and external ear canals both ears Nose: Nares normal. Septum midline. Mucosa normal. No drainage or sinus tenderness. Throat: lips, mucosa, and tongue normal; teeth and gums normal Neck: no adenopathy, no carotid bruit, no JVD, supple, symmetrical, trachea midline and thyroid not enlarged, symmetric, no tenderness/mass/nodules Back: symmetric, no curvature. ROM normal. No CVA tenderness. Lungs: clear to auscultation bilaterally Breasts: normal appearance, no masses or tenderness Heart: regular rate and rhythm, S1, S2 normal, no murmur, click, rub or gallop Abdomen: soft, non-tender; bowel sounds normal; no masses,  no organomegaly Pelvic: deferred Extremities: extremities normal, atraumatic, no cyanosis or edema Pulses: 2+ and symmetric Skin: Skin color, texture, turgor normal. No rashes or lesions Lymph nodes: Cervical, supraclavicular, and axillary nodes normal. Neurologic: Alert and oriented X 3, normal strength and tone. Normal symmetric reflexes. Normal coordination and gait   psych-- + depression,  See screening Assessment:    Healthy female exam.      Plan:     ghm utd Check labs See After Visit Summary for Counseling Recommendations    1. Preventative health care See above - Comprehensive metabolic panel - Lipid panel - CBC with Differential/Platelet - POCT urinalysis dipstick - TSH  2. Acute seasonal allergic rhinitis due to pollen   - fluticasone (FLONASE) 50 MCG/ACT nasal spray; Place 2 sprays into both nostrils daily.  Dispense: 16 g; Refill: 6 - levocetirizine (XYZAL) 5 MG tablet; Take 1 tablet (5 mg total) by mouth every evening.  Dispense: 30 tablet; Refill: 5  3. Abnormal urine   - Urine Culture  4. Need for prophylactic vaccination and inoculation against influenza   - Flu Vaccine QUAD 36+ mos PF IM (Fluarix & Fluzone Quad PF)  5.  Depression-- severe Pt refused meds but was willing to call for counseling-- name and numbers  given for counseling

## 2015-11-26 NOTE — Patient Instructions (Signed)
Preventive Care for Adults, Female A healthy lifestyle and preventive care can promote health and wellness. Preventive health guidelines for women include the following key practices.  A routine yearly physical is a good way to check with your health care provider about your health and preventive screening. It is a chance to share any concerns and updates on your health and to receive a thorough exam.  Visit your dentist for a routine exam and preventive care every 6 months. Brush your teeth twice a day and floss once a day. Good oral hygiene prevents tooth decay and gum disease.  The frequency of eye exams is based on your age, health, family medical history, use of contact lenses, and other factors. Follow your health care provider's recommendations for frequency of eye exams.  Eat a healthy diet. Foods like vegetables, fruits, whole grains, low-fat dairy products, and lean protein foods contain the nutrients you need without too many calories. Decrease your intake of foods high in solid fats, added sugars, and salt. Eat the right amount of calories for you.Get information about a proper diet from your health care provider, if necessary.  Regular physical exercise is one of the most important things you can do for your health. Most adults should get at least 150 minutes of moderate-intensity exercise (any activity that increases your heart rate and causes you to sweat) each week. In addition, most adults need muscle-strengthening exercises on 2 or more days a week.  Maintain a healthy weight. The body mass index (BMI) is a screening tool to identify possible weight problems. It provides an estimate of body fat based on height and weight. Your health care provider can find your BMI and can help you achieve or maintain a healthy weight.For adults 20 years and older:  A BMI below 18.5 is considered underweight.  A BMI of 18.5 to 24.9 is normal.  A BMI of 25 to 29.9 is considered overweight.  A  BMI of 30 and above is considered obese.  Maintain normal blood lipids and cholesterol levels by exercising and minimizing your intake of saturated fat. Eat a balanced diet with plenty of fruit and vegetables. Blood tests for lipids and cholesterol should begin at age 20 and be repeated every 5 years. If your lipid or cholesterol levels are high, you are over 50, or you are at high risk for heart disease, you may need your cholesterol levels checked more frequently.Ongoing high lipid and cholesterol levels should be treated with medicines if diet and exercise are not working.  If you smoke, find out from your health care provider how to quit. If you do not use tobacco, do not start.  Lung cancer screening is recommended for adults aged 26-80 years who are at high risk for developing lung cancer because of a history of smoking. A yearly low-dose CT scan of the lungs is recommended for people who have at least a 30-pack-year history of smoking and are a current smoker or have quit within the past 15 years. A pack year of smoking is smoking an average of 1 pack of cigarettes a day for 1 year (for example: 1 pack a day for 30 years or 2 packs a day for 15 years). Yearly screening should continue until the smoker has stopped smoking for at least 15 years. Yearly screening should be stopped for people who develop a health problem that would prevent them from having lung cancer treatment.  If you are pregnant, do not drink alcohol. If you are  breastfeeding, be very cautious about drinking alcohol. If you are not pregnant and choose to drink alcohol, do not have more than 1 drink per day. One drink is considered to be 12 ounces (355 mL) of beer, 5 ounces (148 mL) of wine, or 1.5 ounces (44 mL) of liquor.  Avoid use of street drugs. Do not share needles with anyone. Ask for help if you need support or instructions about stopping the use of drugs.  High blood pressure causes heart disease and increases the risk  of stroke. Your blood pressure should be checked at least every 1 to 2 years. Ongoing high blood pressure should be treated with medicines if weight loss and exercise do not work.  If you are 55-79 years old, ask your health care provider if you should take aspirin to prevent strokes.  Diabetes screening is done by taking a blood sample to check your blood glucose level after you have not eaten for a certain period of time (fasting). If you are not overweight and you do not have risk factors for diabetes, you should be screened once every 3 years starting at age 45. If you are overweight or obese and you are 40-70 years of age, you should be screened for diabetes every year as part of your cardiovascular risk assessment.  Breast cancer screening is essential preventive care for women. You should practice "breast self-awareness." This means understanding the normal appearance and feel of your breasts and may include breast self-examination. Any changes detected, no matter how small, should be reported to a health care provider. Women in their 20s and 30s should have a clinical breast exam (CBE) by a health care provider as part of a regular health exam every 1 to 3 years. After age 40, women should have a CBE every year. Starting at age 40, women should consider having a mammogram (breast X-ray test) every year. Women who have a family history of breast cancer should talk to their health care provider about genetic screening. Women at a high risk of breast cancer should talk to their health care providers about having an MRI and a mammogram every year.  Breast cancer gene (BRCA)-related cancer risk assessment is recommended for women who have family members with BRCA-related cancers. BRCA-related cancers include breast, ovarian, tubal, and peritoneal cancers. Having family members with these cancers may be associated with an increased risk for harmful changes (mutations) in the breast cancer genes BRCA1 and  BRCA2. Results of the assessment will determine the need for genetic counseling and BRCA1 and BRCA2 testing.  Your health care provider may recommend that you be screened regularly for cancer of the pelvic organs (ovaries, uterus, and vagina). This screening involves a pelvic examination, including checking for microscopic changes to the surface of your cervix (Pap test). You may be encouraged to have this screening done every 3 years, beginning at age 21.  For women ages 30-65, health care providers may recommend pelvic exams and Pap testing every 3 years, or they may recommend the Pap and pelvic exam, combined with testing for human papilloma virus (HPV), every 5 years. Some types of HPV increase your risk of cervical cancer. Testing for HPV may also be done on women of any age with unclear Pap test results.  Other health care providers may not recommend any screening for nonpregnant women who are considered low risk for pelvic cancer and who do not have symptoms. Ask your health care provider if a screening pelvic exam is right for   you.  If you have had past treatment for cervical cancer or a condition that could lead to cancer, you need Pap tests and screening for cancer for at least 20 years after your treatment. If Pap tests have been discontinued, your risk factors (such as having a new sexual partner) need to be reassessed to determine if screening should resume. Some women have medical problems that increase the chance of getting cervical cancer. In these cases, your health care provider may recommend more frequent screening and Pap tests.  Colorectal cancer can be detected and often prevented. Most routine colorectal cancer screening begins at the age of 50 years and continues through age 75 years. However, your health care provider may recommend screening at an earlier age if you have risk factors for colon cancer. On a yearly basis, your health care provider may provide home test kits to check  for hidden blood in the stool. Use of a small camera at the end of a tube, to directly examine the colon (sigmoidoscopy or colonoscopy), can detect the earliest forms of colorectal cancer. Talk to your health care provider about this at age 50, when routine screening begins. Direct exam of the colon should be repeated every 5-10 years through age 75 years, unless early forms of precancerous polyps or small growths are found.  People who are at an increased risk for hepatitis B should be screened for this virus. You are considered at high risk for hepatitis B if:  You were born in a country where hepatitis B occurs often. Talk with your health care provider about which countries are considered high risk.  Your parents were born in a high-risk country and you have not received a shot to protect against hepatitis B (hepatitis B vaccine).  You have HIV or AIDS.  You use needles to inject street drugs.  You live with, or have sex with, someone who has hepatitis B.  You get hemodialysis treatment.  You take certain medicines for conditions like cancer, organ transplantation, and autoimmune conditions.  Hepatitis C blood testing is recommended for all people born from 1945 through 1965 and any individual with known risks for hepatitis C.  Practice safe sex. Use condoms and avoid high-risk sexual practices to reduce the spread of sexually transmitted infections (STIs). STIs include gonorrhea, chlamydia, syphilis, trichomonas, herpes, HPV, and human immunodeficiency virus (HIV). Herpes, HIV, and HPV are viral illnesses that have no cure. They can result in disability, cancer, and death.  You should be screened for sexually transmitted illnesses (STIs) including gonorrhea and chlamydia if:  You are sexually active and are younger than 24 years.  You are older than 24 years and your health care provider tells you that you are at risk for this type of infection.  Your sexual activity has changed  since you were last screened and you are at an increased risk for chlamydia or gonorrhea. Ask your health care provider if you are at risk.  If you are at risk of being infected with HIV, it is recommended that you take a prescription medicine daily to prevent HIV infection. This is called preexposure prophylaxis (PrEP). You are considered at risk if:  You are sexually active and do not regularly use condoms or know the HIV status of your partner(s).  You take drugs by injection.  You are sexually active with a partner who has HIV.  Talk with your health care provider about whether you are at high risk of being infected with HIV. If   you choose to begin PrEP, you should first be tested for HIV. You should then be tested every 3 months for as long as you are taking PrEP.  Osteoporosis is a disease in which the bones lose minerals and strength with aging. This can result in serious bone fractures or breaks. The risk of osteoporosis can be identified using a bone density scan. Women ages 67 years and over and women at risk for fractures or osteoporosis should discuss screening with their health care providers. Ask your health care provider whether you should take a calcium supplement or vitamin D to reduce the rate of osteoporosis.  Menopause can be associated with physical symptoms and risks. Hormone replacement therapy is available to decrease symptoms and risks. You should talk to your health care provider about whether hormone replacement therapy is right for you.  Use sunscreen. Apply sunscreen liberally and repeatedly throughout the day. You should seek shade when your shadow is shorter than you. Protect yourself by wearing long sleeves, pants, a wide-brimmed hat, and sunglasses year round, whenever you are outdoors.  Once a month, do a whole body skin exam, using a mirror to look at the skin on your back. Tell your health care provider of new moles, moles that have irregular borders, moles that  are larger than a pencil eraser, or moles that have changed in shape or color.  Stay current with required vaccines (immunizations).  Influenza vaccine. All adults should be immunized every year.  Tetanus, diphtheria, and acellular pertussis (Td, Tdap) vaccine. Pregnant women should receive 1 dose of Tdap vaccine during each pregnancy. The dose should be obtained regardless of the length of time since the last dose. Immunization is preferred during the 27th-36th week of gestation. An adult who has not previously received Tdap or who does not know her vaccine status should receive 1 dose of Tdap. This initial dose should be followed by tetanus and diphtheria toxoids (Td) booster doses every 10 years. Adults with an unknown or incomplete history of completing a 3-dose immunization series with Td-containing vaccines should begin or complete a primary immunization series including a Tdap dose. Adults should receive a Td booster every 10 years.  Varicella vaccine. An adult without evidence of immunity to varicella should receive 2 doses or a second dose if she has previously received 1 dose. Pregnant females who do not have evidence of immunity should receive the first dose after pregnancy. This first dose should be obtained before leaving the health care facility. The second dose should be obtained 4-8 weeks after the first dose.  Human papillomavirus (HPV) vaccine. Females aged 13-26 years who have not received the vaccine previously should obtain the 3-dose series. The vaccine is not recommended for use in pregnant females. However, pregnancy testing is not needed before receiving a dose. If a female is found to be pregnant after receiving a dose, no treatment is needed. In that case, the remaining doses should be delayed until after the pregnancy. Immunization is recommended for any person with an immunocompromised condition through the age of 61 years if she did not get any or all doses earlier. During the  3-dose series, the second dose should be obtained 4-8 weeks after the first dose. The third dose should be obtained 24 weeks after the first dose and 16 weeks after the second dose.  Zoster vaccine. One dose is recommended for adults aged 30 years or older unless certain conditions are present.  Measles, mumps, and rubella (MMR) vaccine. Adults born  before 1957 generally are considered immune to measles and mumps. Adults born in 1957 or later should have 1 or more doses of MMR vaccine unless there is a contraindication to the vaccine or there is laboratory evidence of immunity to each of the three diseases. A routine second dose of MMR vaccine should be obtained at least 28 days after the first dose for students attending postsecondary schools, health care workers, or international travelers. People who received inactivated measles vaccine or an unknown type of measles vaccine during 1963-1967 should receive 2 doses of MMR vaccine. People who received inactivated mumps vaccine or an unknown type of mumps vaccine before 1979 and are at high risk for mumps infection should consider immunization with 2 doses of MMR vaccine. For females of childbearing age, rubella immunity should be determined. If there is no evidence of immunity, females who are not pregnant should be vaccinated. If there is no evidence of immunity, females who are pregnant should delay immunization until after pregnancy. Unvaccinated health care workers born before 1957 who lack laboratory evidence of measles, mumps, or rubella immunity or laboratory confirmation of disease should consider measles and mumps immunization with 2 doses of MMR vaccine or rubella immunization with 1 dose of MMR vaccine.  Pneumococcal 13-valent conjugate (PCV13) vaccine. When indicated, a person who is uncertain of his immunization history and has no record of immunization should receive the PCV13 vaccine. All adults 65 years of age and older should receive this  vaccine. An adult aged 19 years or older who has certain medical conditions and has not been previously immunized should receive 1 dose of PCV13 vaccine. This PCV13 should be followed with a dose of pneumococcal polysaccharide (PPSV23) vaccine. Adults who are at high risk for pneumococcal disease should obtain the PPSV23 vaccine at least 8 weeks after the dose of PCV13 vaccine. Adults older than 38 years of age who have normal immune system function should obtain the PPSV23 vaccine dose at least 1 year after the dose of PCV13 vaccine.  Pneumococcal polysaccharide (PPSV23) vaccine. When PCV13 is also indicated, PCV13 should be obtained first. All adults aged 65 years and older should be immunized. An adult younger than age 65 years who has certain medical conditions should be immunized. Any person who resides in a nursing home or long-term care facility should be immunized. An adult smoker should be immunized. People with an immunocompromised condition and certain other conditions should receive both PCV13 and PPSV23 vaccines. People with human immunodeficiency virus (HIV) infection should be immunized as soon as possible after diagnosis. Immunization during chemotherapy or radiation therapy should be avoided. Routine use of PPSV23 vaccine is not recommended for American Indians, [place] Natives, or people younger than 65 years unless there are medical conditions that require PPSV23 vaccine. When indicated, people who have unknown immunization and have no record of immunization should receive PPSV23 vaccine. One-time revaccination 5 years after the first dose of PPSV23 is recommended for people aged 19-64 years who have chronic kidney failure, nephrotic syndrome, asplenia, or immunocompromised conditions. People who received 1-2 doses of PPSV23 before age 65 years should receive another dose of PPSV23 vaccine at age 65 years or later if at least 5 years have passed since the previous dose. Doses of PPSV23 are not  needed for people immunized with PPSV23 at or after age 65 years.  Meningococcal vaccine. Adults with asplenia or persistent complement component deficiencies should receive 2 doses of quadrivalent meningococcal conjugate (MenACWY-D) vaccine. The doses should be obtained   at least 2 months apart. Microbiologists working with certain meningococcal bacteria, [employer] recruits, people at risk during an outbreak, and people who travel to or live in countries with a high rate of meningitis should be immunized. A first-year college student up through age 67 years who is living in a residence hall should receive a dose if she did not receive a dose on or after her 16th birthday. Adults who have certain high-risk conditions should receive one or more doses of vaccine.  Hepatitis A vaccine. Adults who wish to be protected from this disease, have certain high-risk conditions, work with hepatitis A-infected animals, work in hepatitis A research labs, or travel to or work in countries with a high rate of hepatitis A should be immunized. Adults who were previously unvaccinated and who anticipate close contact with an international adoptee during the first 60 days after arrival in the [place] from a country with a high rate of hepatitis A should be immunized.  Hepatitis B vaccine. Adults who wish to be protected from this disease, have certain high-risk conditions, may be exposed to blood or other infectious body fluids, are household contacts or sex partners of hepatitis B positive people, are clients or workers in certain care facilities, or travel to or work in countries with a high rate of hepatitis B should be immunized.  Haemophilus influenzae type b (Hib) vaccine. A previously unvaccinated person with asplenia or sickle cell disease or having a scheduled splenectomy should receive 1 dose of Hib vaccine. Regardless of previous immunization, a recipient of a hematopoietic stem cell transplant should receive a  3-dose series 6-12 months after her successful transplant. Hib vaccine is not recommended for adults with HIV infection. Preventive Services / Frequency Ages 67 to 63 years  Blood pressure check.** / Every 3-5 years.  Lipid and cholesterol check.** / Every 5 years beginning at age 80.  Clinical breast exam.** / Every 3 years for women in their 41s and 52s.  BRCA-related cancer risk assessment.** / For women who have family members with a BRCA-related cancer (breast, ovarian, tubal, or peritoneal cancers).  Pap test.** / Every 2 years from ages 78 through 72. Every 3 years starting at age 41 through age 7 or 21 with a history of 3 consecutive normal Pap tests.  HPV screening.** / Every 3 years from ages 4 through ages 81 to 70 with a history of 3 consecutive normal Pap tests.  Hepatitis C blood test.** / For any individual with known risks for hepatitis C.  Skin self-exam. / Monthly.  Influenza vaccine. / Every year.  Tetanus, diphtheria, and acellular pertussis (Tdap, Td) vaccine.** / Consult your health care provider. Pregnant women should receive 1 dose of Tdap vaccine during each pregnancy. 1 dose of Td every 10 years.  Varicella vaccine.** / Consult your health care provider. Pregnant females who do not have evidence of immunity should receive the first dose after pregnancy.  HPV vaccine. / 3 doses over 6 months, if 37 and younger. The vaccine is not recommended for use in pregnant females. However, pregnancy testing is not needed before receiving a dose.  Measles, mumps, rubella (MMR) vaccine.** / You need at least 1 dose of MMR if you were born in 1957 or later. You may also need a 2nd dose. For females of childbearing age, rubella immunity should be determined. If there is no evidence of immunity, females who are not pregnant should be vaccinated. If there is no evidence of immunity, females who are  pregnant should delay immunization until after pregnancy.  Pneumococcal  13-valent conjugate (PCV13) vaccine.** / Consult your health care provider.  Pneumococcal polysaccharide (PPSV23) vaccine.** / 1 to 2 doses if you smoke cigarettes or if you have certain conditions.  Meningococcal vaccine.** / 1 dose if you are age 68 to 8 years and a [employer] researcher living in a residence hall, or have one of several medical conditions, you need to get vaccinated against meningococcal disease. You may also need additional booster doses.  Hepatitis A vaccine.** / Consult your health care provider.  Hepatitis B vaccine.** / Consult your health care provider.  Haemophilus influenzae type b (Hib) vaccine.** / Consult your health care provider. Ages 7 to 53 years  Blood pressure check.** / Every year.  Lipid and cholesterol check.** / Every 5 years beginning at age 25 years.  Lung cancer screening. / Every year if you are aged 11-80 years and have a 30-pack-year history of smoking and currently smoke or have quit within the past 15 years. Yearly screening is stopped once you have quit smoking for at least 15 years or develop a health problem that would prevent you from having lung cancer treatment.  Clinical breast exam.** / Every year after age 48 years.  BRCA-related cancer risk assessment.** / For women who have family members with a BRCA-related cancer (breast, ovarian, tubal, or peritoneal cancers).  Mammogram.** / Every year beginning at age 41 years and continuing for as long as you are in good health. Consult with your health care provider.  Pap test.** / Every 3 years starting at age 65 years through age 37 or 70 years with a history of 3 consecutive normal Pap tests.  HPV screening.** / Every 3 years from ages 72 years through ages 60 to 40 years with a history of 3 consecutive normal Pap tests.  Fecal occult blood test (FOBT) of stool. / Every year beginning at age 21 years and continuing until age 5 years. You may not need to do this test if you get  a colonoscopy every 10 years.  Flexible sigmoidoscopy or colonoscopy.** / Every 5 years for a flexible sigmoidoscopy or every 10 years for a colonoscopy beginning at age 35 years and continuing until age 48 years.  Hepatitis C blood test.** / For all people born from [birth year] through 1965 and any individual with known risks for hepatitis C.  Skin self-exam. / Monthly.  Influenza vaccine. / Every year.  Tetanus, diphtheria, and acellular pertussis (Tdap/Td) vaccine.** / Consult your health care provider. Pregnant women should receive 1 dose of Tdap vaccine during each pregnancy. 1 dose of Td every 10 years.  Varicella vaccine.** / Consult your health care provider. Pregnant females who do not have evidence of immunity should receive the first dose after pregnancy.  Zoster vaccine.** / 1 dose for adults aged 30 years or older.  Measles, mumps, rubella (MMR) vaccine.** / You need at least 1 dose of MMR if you were born in 1957 or later. You may also need a second dose. For females of childbearing age, rubella immunity should be determined. If there is no evidence of immunity, females who are not pregnant should be vaccinated. If there is no evidence of immunity, females who are pregnant should delay immunization until after pregnancy.  Pneumococcal 13-valent conjugate (PCV13) vaccine.** / Consult your health care provider.  Pneumococcal polysaccharide (PPSV23) vaccine.** / 1 to 2 doses if you smoke cigarettes or if you have certain conditions.  Meningococcal vaccine.** /  Consult your health care provider.  Hepatitis A vaccine.** / Consult your health care provider.  Hepatitis B vaccine.** / Consult your health care provider.  Haemophilus influenzae type b (Hib) vaccine.** / Consult your health care provider. Ages 64 years and over  Blood pressure check.** / Every year.  Lipid and cholesterol check.** / Every 5 years beginning at age 23 years.  Lung cancer screening. / Every year if you  are aged 16-80 years and have a 30-pack-year history of smoking and currently smoke or have quit within the past 15 years. Yearly screening is stopped once you have quit smoking for at least 15 years or develop a health problem that would prevent you from having lung cancer treatment.  Clinical breast exam.** / Every year after age 74 years.  BRCA-related cancer risk assessment.** / For women who have family members with a BRCA-related cancer (breast, ovarian, tubal, or peritoneal cancers).  Mammogram.** / Every year beginning at age 44 years and continuing for as long as you are in good health. Consult with your health care provider.  Pap test.** / Every 3 years starting at age 58 years through age 22 or 39 years with 3 consecutive normal Pap tests. Testing can be stopped between 65 and 70 years with 3 consecutive normal Pap tests and no abnormal Pap or HPV tests in the past 10 years.  HPV screening.** / Every 3 years from ages 64 years through ages 70 or 61 years with a history of 3 consecutive normal Pap tests. Testing can be stopped between 65 and 70 years with 3 consecutive normal Pap tests and no abnormal Pap or HPV tests in the past 10 years.  Fecal occult blood test (FOBT) of stool. / Every year beginning at age 40 years and continuing until age 27 years. You may not need to do this test if you get a colonoscopy every 10 years.  Flexible sigmoidoscopy or colonoscopy.** / Every 5 years for a flexible sigmoidoscopy or every 10 years for a colonoscopy beginning at age 7 years and continuing until age 32 years.  Hepatitis C blood test.** / For all people born from [birth year] through 1963 and any individual with known risks for hepatitis C.  Osteoporosis screening.** / A one-time screening for women ages 30 years and over and women at risk for fractures or osteoporosis.  Skin self-exam. / Monthly.  Influenza vaccine. / Every year.  Tetanus, diphtheria, and acellular pertussis (Tdap/Td)  vaccine.** / 1 dose of Td every 10 years.  Varicella vaccine.** / Consult your health care provider.  Zoster vaccine.** / 1 dose for adults aged 35 years or older.  Pneumococcal 13-valent conjugate (PCV13) vaccine.** / Consult your health care provider.  Pneumococcal polysaccharide (PPSV23) vaccine.** / 1 dose for all adults aged 46 years and older.  Meningococcal vaccine.** / Consult your health care provider.  Hepatitis A vaccine.** / Consult your health care provider.  Hepatitis B vaccine.** / Consult your health care provider.  Haemophilus influenzae type b (Hib) vaccine.** / Consult your health care provider. ** Family history and personal history of risk and conditions may change your health care provider's recommendations.   This information is not intended to replace advice given to you by your health care provider. Make sure you discuss any questions you have with your health care provider.   Document Released: 03/17/2001 Document Revised: 02/09/2014 Document Reviewed: 06/16/2010 Elsevier Interactive Patient Education [employer].

## 2015-11-27 LAB — URINE CULTURE

## 2016-01-02 ENCOUNTER — Ambulatory Visit: Payer: Self-pay | Admitting: Family Medicine

## 2016-01-22 ENCOUNTER — Ambulatory Visit (INDEPENDENT_AMBULATORY_CARE_PROVIDER_SITE_OTHER): Payer: BLUE CROSS/BLUE SHIELD | Admitting: Psychology

## 2016-01-22 DIAGNOSIS — F4323 Adjustment disorder with mixed anxiety and depressed mood: Secondary | ICD-10-CM

## 2016-02-18 ENCOUNTER — Ambulatory Visit (INDEPENDENT_AMBULATORY_CARE_PROVIDER_SITE_OTHER): Payer: BLUE CROSS/BLUE SHIELD | Admitting: Psychology

## 2016-02-18 DIAGNOSIS — F4323 Adjustment disorder with mixed anxiety and depressed mood: Secondary | ICD-10-CM

## 2016-03-03 ENCOUNTER — Ambulatory Visit: Payer: BLUE CROSS/BLUE SHIELD | Admitting: Psychology

## 2016-03-18 ENCOUNTER — Ambulatory Visit (INDEPENDENT_AMBULATORY_CARE_PROVIDER_SITE_OTHER): Payer: BLUE CROSS/BLUE SHIELD | Admitting: Psychology

## 2016-03-18 DIAGNOSIS — F4323 Adjustment disorder with mixed anxiety and depressed mood: Secondary | ICD-10-CM | POA: Diagnosis not present

## 2016-04-06 DIAGNOSIS — Z118 Encounter for screening for other infectious and parasitic diseases: Secondary | ICD-10-CM | POA: Diagnosis not present

## 2016-04-06 DIAGNOSIS — Z113 Encounter for screening for infections with a predominantly sexual mode of transmission: Secondary | ICD-10-CM | POA: Diagnosis not present

## 2016-04-06 DIAGNOSIS — B373 Candidiasis of vulva and vagina: Secondary | ICD-10-CM | POA: Diagnosis not present

## 2016-04-06 DIAGNOSIS — Z114 Encounter for screening for human immunodeficiency virus [HIV]: Secondary | ICD-10-CM | POA: Diagnosis not present

## 2016-04-06 DIAGNOSIS — Z1159 Encounter for screening for other viral diseases: Secondary | ICD-10-CM | POA: Diagnosis not present

## 2016-04-07 ENCOUNTER — Ambulatory Visit (INDEPENDENT_AMBULATORY_CARE_PROVIDER_SITE_OTHER): Payer: BLUE CROSS/BLUE SHIELD | Admitting: Psychology

## 2016-04-07 DIAGNOSIS — F4323 Adjustment disorder with mixed anxiety and depressed mood: Secondary | ICD-10-CM

## 2016-04-16 IMAGING — DX DG CHEST 2V
2 series · 2 of 2 positions shown · non-contrast
Comparison: None.

CLINICAL DATA: Initial encounter for three-week history of cough.

EXAM:
CHEST  2 VIEW

[chest pa]
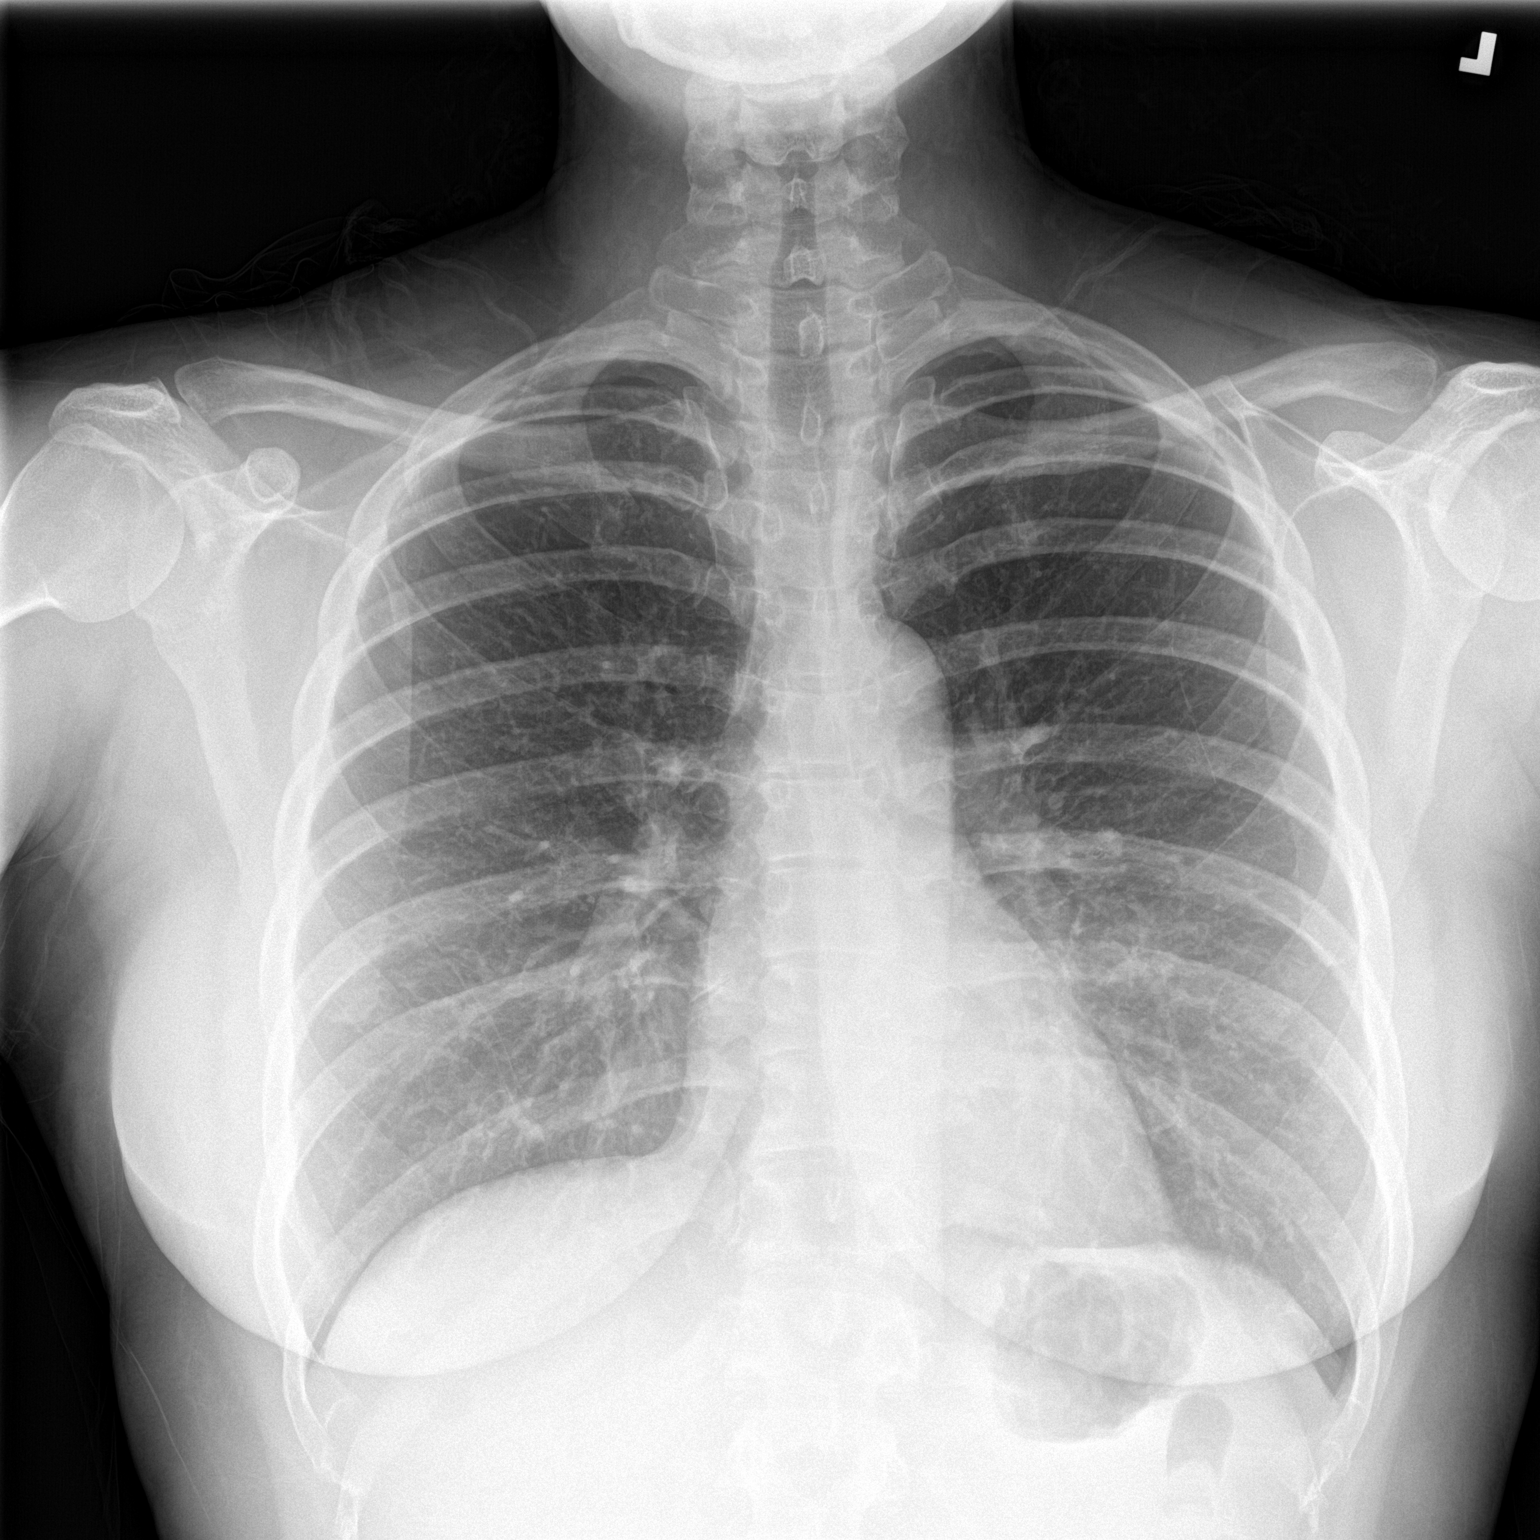

[chest lat]
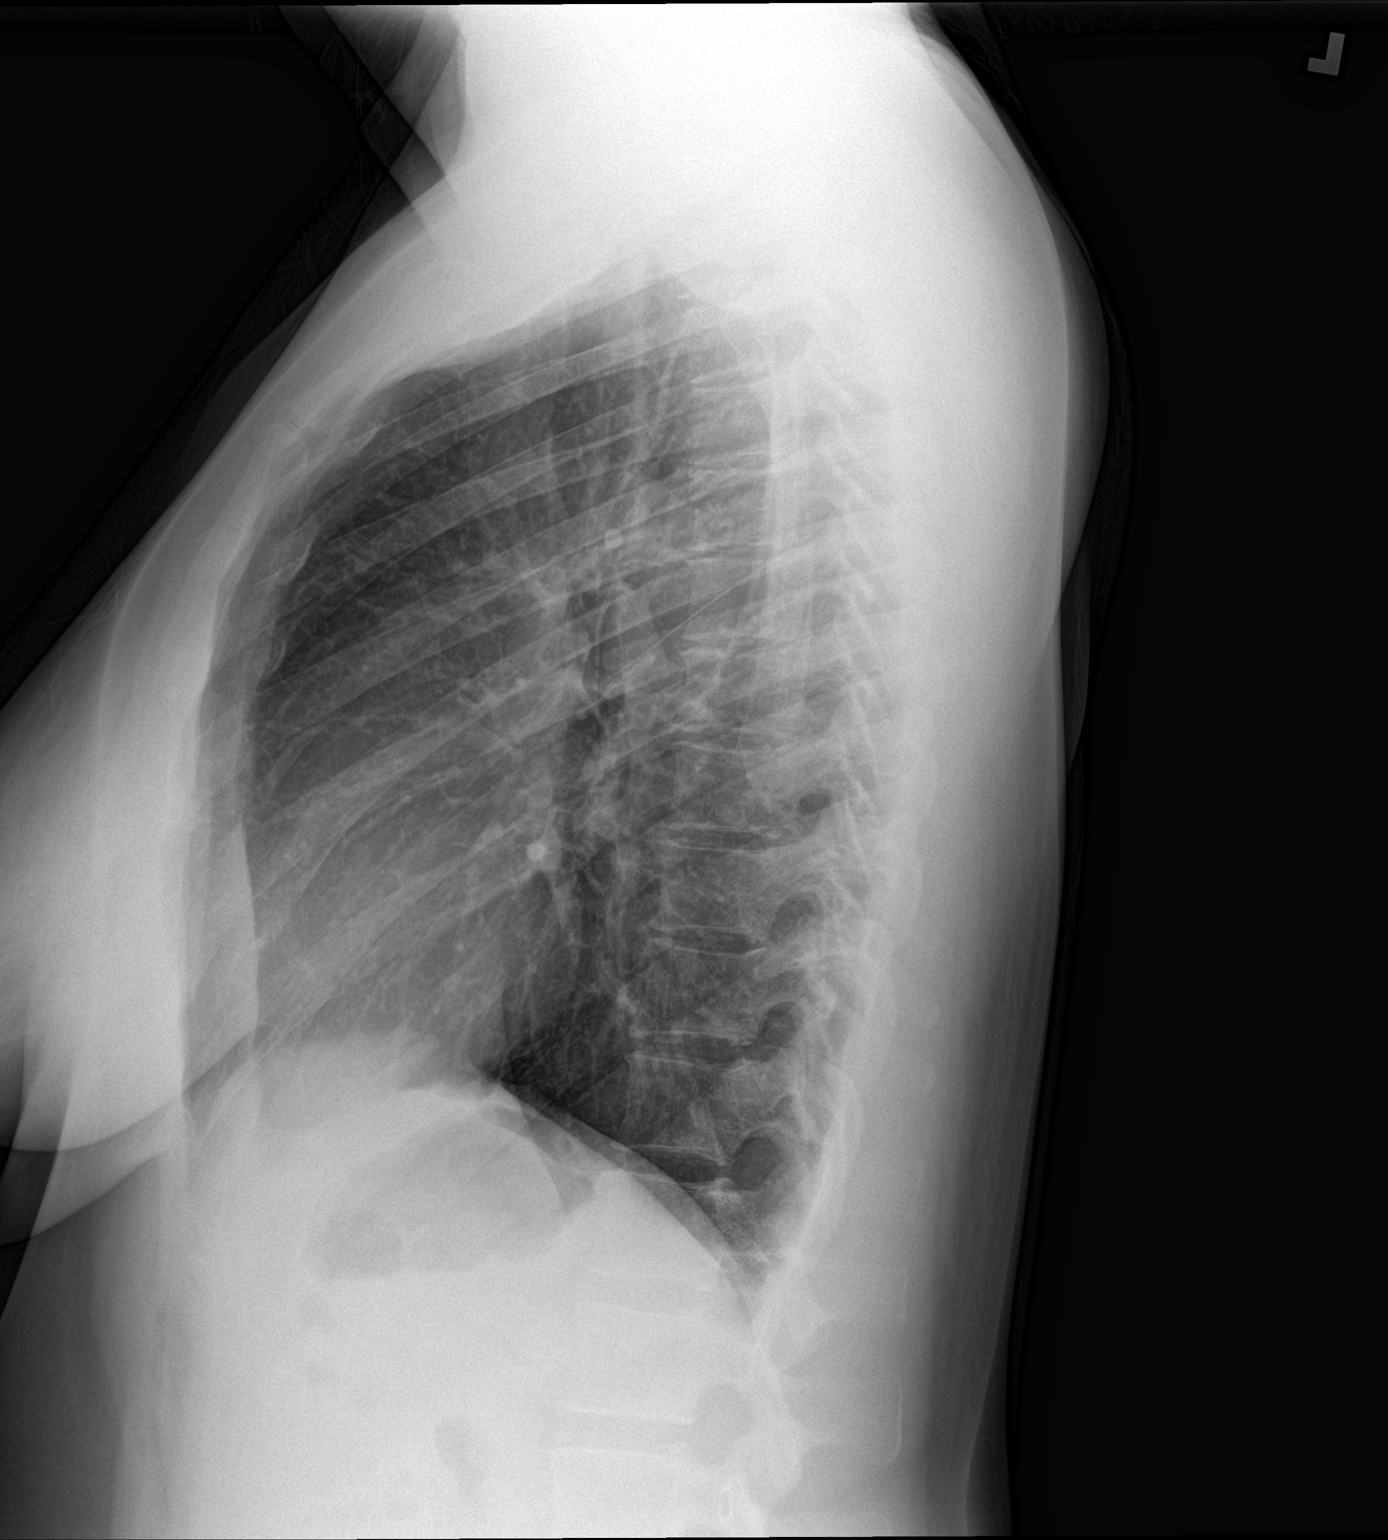

[2 of 2 positions shown; findings below may reference images not displayed]

FINDINGS: The heart size and mediastinal contours are within normal limits.
Both lungs are clear. The visualized skeletal structures are
unremarkable.
IMPRESSION: No active cardiopulmonary disease.

## 2016-04-21 ENCOUNTER — Ambulatory Visit (INDEPENDENT_AMBULATORY_CARE_PROVIDER_SITE_OTHER): Payer: BLUE CROSS/BLUE SHIELD | Admitting: Psychology

## 2016-04-21 DIAGNOSIS — F4323 Adjustment disorder with mixed anxiety and depressed mood: Secondary | ICD-10-CM

## 2016-05-26 ENCOUNTER — Ambulatory Visit (INDEPENDENT_AMBULATORY_CARE_PROVIDER_SITE_OTHER): Payer: BLUE CROSS/BLUE SHIELD | Admitting: Psychology

## 2016-05-26 DIAGNOSIS — F4323 Adjustment disorder with mixed anxiety and depressed mood: Secondary | ICD-10-CM | POA: Diagnosis not present

## 2016-06-02 ENCOUNTER — Ambulatory Visit (INDEPENDENT_AMBULATORY_CARE_PROVIDER_SITE_OTHER): Payer: BLUE CROSS/BLUE SHIELD | Admitting: Psychology

## 2016-06-02 DIAGNOSIS — F4323 Adjustment disorder with mixed anxiety and depressed mood: Secondary | ICD-10-CM | POA: Diagnosis not present

## 2016-07-27 DIAGNOSIS — N6323 Unspecified lump in the left breast, lower outer quadrant: Secondary | ICD-10-CM | POA: Diagnosis not present

## 2016-07-30 DIAGNOSIS — R922 Inconclusive mammogram: Secondary | ICD-10-CM | POA: Diagnosis not present

## 2016-07-30 DIAGNOSIS — N6002 Solitary cyst of left breast: Secondary | ICD-10-CM | POA: Diagnosis not present

## 2016-09-15 DIAGNOSIS — Z113 Encounter for screening for infections with a predominantly sexual mode of transmission: Secondary | ICD-10-CM | POA: Diagnosis not present

## 2016-09-15 DIAGNOSIS — Z1159 Encounter for screening for other viral diseases: Secondary | ICD-10-CM | POA: Diagnosis not present

## 2016-09-15 DIAGNOSIS — Z01419 Encounter for gynecological examination (general) (routine) without abnormal findings: Secondary | ICD-10-CM | POA: Diagnosis not present

## 2016-09-15 DIAGNOSIS — Z114 Encounter for screening for human immunodeficiency virus [HIV]: Secondary | ICD-10-CM | POA: Diagnosis not present

## 2016-09-15 DIAGNOSIS — Z6835 Body mass index (BMI) 35.0-35.9, adult: Secondary | ICD-10-CM | POA: Diagnosis not present

## 2016-09-15 DIAGNOSIS — Z118 Encounter for screening for other infectious and parasitic diseases: Secondary | ICD-10-CM | POA: Diagnosis not present

## 2016-11-26 ENCOUNTER — Ambulatory Visit (INDEPENDENT_AMBULATORY_CARE_PROVIDER_SITE_OTHER): Payer: BLUE CROSS/BLUE SHIELD | Admitting: Family Medicine

## 2016-11-26 ENCOUNTER — Encounter: Payer: Self-pay | Admitting: Family Medicine

## 2016-11-26 VITALS — BP 108/78 | HR 80 | Temp 97.8°F | Ht 65.0 in | Wt 207.0 lb

## 2016-11-26 DIAGNOSIS — Z0000 Encounter for general adult medical examination without abnormal findings: Secondary | ICD-10-CM | POA: Diagnosis not present

## 2016-11-26 DIAGNOSIS — T148XXA Other injury of unspecified body region, initial encounter: Secondary | ICD-10-CM

## 2016-11-26 DIAGNOSIS — R319 Hematuria, unspecified: Secondary | ICD-10-CM | POA: Diagnosis not present

## 2016-11-26 DIAGNOSIS — Z23 Encounter for immunization: Secondary | ICD-10-CM | POA: Diagnosis not present

## 2016-11-26 DIAGNOSIS — E559 Vitamin D deficiency, unspecified: Secondary | ICD-10-CM | POA: Diagnosis not present

## 2016-11-26 DIAGNOSIS — R768 Other specified abnormal immunological findings in serum: Secondary | ICD-10-CM | POA: Insufficient documentation

## 2016-11-26 LAB — LIPID PANEL
CHOLESTEROL: 142 mg/dL (ref 0–200)
HDL: 55.9 mg/dL (ref >39.00)
LDL CALC: 76 mg/dL (ref 0–99)
NonHDL: 86.4
TRIGLYCERIDES: 53 mg/dL (ref 0.0–149.0)
Total CHOL/HDL Ratio: 3
VLDL: 10.6 mg/dL (ref 0.0–40.0)

## 2016-11-26 LAB — CBC WITH DIFFERENTIAL/PLATELET
Basophils Absolute: 0 10*3/uL (ref 0.0–0.1)
Basophils Relative: 0.5 % (ref 0.0–3.0)
EOS ABS: 0.1 10*3/uL (ref 0.0–0.7)
EOS PCT: 0.9 % (ref 0.0–5.0)
HEMATOCRIT: 41.7 % (ref 36.0–46.0)
Hemoglobin: 13.4 g/dL (ref 12.0–15.0)
LYMPHS PCT: 33.1 % (ref 12.0–46.0)
Lymphs Abs: 2.6 10*3/uL (ref 0.7–4.0)
MCHC: 32.3 g/dL (ref 30.0–36.0)
MCV: 85.2 fl (ref 78.0–100.0)
Monocytes Absolute: 0.5 10*3/uL (ref 0.1–1.0)
Monocytes Relative: 6.5 % (ref 3.0–12.0)
NEUTROS ABS: 4.6 10*3/uL (ref 1.4–7.7)
Neutrophils Relative %: 59 % (ref 43.0–77.0)
PLATELETS: 181 10*3/uL (ref 150.0–400.0)
RBC: 4.89 Mil/uL (ref 3.87–5.11)
RDW: 13.8 % (ref 11.5–15.5)
WBC: 7.8 10*3/uL (ref 4.0–10.5)

## 2016-11-26 LAB — POC URINALSYSI DIPSTICK (AUTOMATED)
Bilirubin, UA: NEGATIVE
Glucose, UA: NEGATIVE
Ketones, UA: NEGATIVE
LEUKOCYTES UA: NEGATIVE
NITRITE UA: NEGATIVE
PH UA: 6 (ref 5.0–8.0)
PROTEIN UA: NEGATIVE
Spec Grav, UA: >=1.03 — AB (ref 1.010–1.025)
UROBILINOGEN UA: 0.2 U/dL

## 2016-11-26 LAB — VITAMIN D 25 HYDROXY (VIT D DEFICIENCY, FRACTURES): VITD: 24.96 ng/mL — ABNORMAL LOW (ref 30.00–100.00)

## 2016-11-26 LAB — COMPREHENSIVE METABOLIC PANEL
ALBUMIN: 4.1 g/dL (ref 3.5–5.2)
ALK PHOS: 36 U/L — AB (ref 39–117)
ALT: 16 U/L (ref 0–35)
AST: 17 U/L (ref 0–37)
BUN: 11 mg/dL (ref 6–23)
CALCIUM: 9.2 mg/dL (ref 8.4–10.5)
CHLORIDE: 104 meq/L (ref 96–112)
CO2: 28 mEq/L (ref 19–32)
Creatinine, Ser: 0.81 mg/dL (ref 0.40–1.20)
GFR: 101.21 mL/min (ref >60.00)
Glucose, Bld: 92 mg/dL (ref 70–99)
POTASSIUM: 4.3 meq/L (ref 3.5–5.1)
SODIUM: 137 meq/L (ref 135–145)
Total Bilirubin: 0.4 mg/dL (ref 0.2–1.2)
Total Protein: 7 g/dL (ref 6.0–8.3)

## 2016-11-26 LAB — TSH: TSH: 1.54 u[IU]/mL (ref 0.35–4.50)

## 2016-11-26 NOTE — Patient Instructions (Signed)
Preventive Care 18-39 Years, Female Preventive care refers to lifestyle choices and visits with your health care provider that can promote health and wellness. What does preventive care include?  A yearly physical exam. This is also called an annual well check.  Dental exams once or twice a year.  Routine eye exams. Ask your health care provider how often you should have your eyes checked.  Personal lifestyle choices, including: ? Daily care of your teeth and gums. ? Regular physical activity. ? Eating a healthy diet. ? Avoiding tobacco and drug use. ? Limiting alcohol use. ? Practicing safe sex. ? Taking vitamin and mineral supplements as recommended by your health care provider. What happens during an annual well check? The services and screenings done by your health care provider during your annual well check will depend on your age, overall health, lifestyle risk factors, and family history of disease. Counseling Your health care provider may ask you questions about your:  Alcohol use.  Tobacco use.  Drug use.  Emotional well-being.  Home and relationship well-being.  Sexual activity.  Eating habits.  Work and work Statistician.  Method of birth control.  Menstrual cycle.  Pregnancy history.  Screening You may have the following tests or measurements:  Height, weight, and BMI.  Diabetes screening. This is done by checking your blood sugar (glucose) after you have not eaten for a while (fasting).  Blood pressure.  Lipid and cholesterol levels. These may be checked every 5 years starting at age 66.  Skin check.  Hepatitis C blood test.  Hepatitis B blood test.  Sexually transmitted disease (STD) testing.  BRCA-related cancer screening. This may be done if you have a family history of breast, ovarian, tubal, or peritoneal cancers.  Pelvic exam and Pap test. This may be done every 3 years starting at age 40. Starting at age 59, this may be done every 5  years if you have a Pap test in combination with an HPV test.  Discuss your test results, treatment options, and if necessary, the need for more tests with your health care provider. Vaccines Your health care provider may recommend certain vaccines, such as:  Influenza vaccine. This is recommended every year.  Tetanus, diphtheria, and acellular pertussis (Tdap, Td) vaccine. You may need a Td booster every 10 years.  Varicella vaccine. You may need this if you have not been vaccinated.  HPV vaccine. If you are 69 or younger, you may need three doses over 6 months.  Measles, mumps, and rubella (MMR) vaccine. You may need at least one dose of MMR. You may also need a second dose.  Pneumococcal 13-valent conjugate (PCV13) vaccine. You may need this if you have certain conditions and were not previously vaccinated.  Pneumococcal polysaccharide (PPSV23) vaccine. You may need one or two doses if you smoke cigarettes or if you have certain conditions.  Meningococcal vaccine. One dose is recommended if you are age 27-21 years and a first-year college student living in a residence hall, or if you have one of several medical conditions. You may also need additional booster doses.  Hepatitis A vaccine. You may need this if you have certain conditions or if you travel or work in places where you may be exposed to hepatitis A.  Hepatitis B vaccine. You may need this if you have certain conditions or if you travel or work in places where you may be exposed to hepatitis B.  Haemophilus influenzae type b (Hib) vaccine. You may need this if  you have certain risk factors.  Talk to your health care provider about which screenings and vaccines you need and how often you need them. This information is not intended to replace advice given to you by your health care provider. Make sure you discuss any questions you have with your health care provider. Document Released: 03/17/2001 Document Revised: 10/09/2015  Document Reviewed: 11/20/2014 Elsevier Interactive Patient Education  2017 [employer].  Syphilis Test Why am I having this test? Syphilis is an infectious disease caused by a type of bacteria called Treponema pallidum. It is most commonly spread through sexual contact. Syphilis may also spread to a fetus through the blood of the mother. You may have this test if you have symptoms of syphilis, such as a painless sore (chancre). Syphilis symptoms resemble the symptoms of many other conditions. Early symptoms may go away before new symptoms develop as the disease progresses. Untreated syphilis can lead to severe complications. Ultimately, the bacteria can damage the heart, brain, and nervous system. Pregnant women often have a syphilis test. You may also have this test if you are at risk of the disease because of:  Having a sexual partner with syphilis.  Engaging in high-risk sexual activity.  Having another sexually transmitted infection, such as gonorrhea.  What kind of sample is taken? A blood test is the most common way to test for syphilis. This requires taking a blood sample from a vein in your hand or arm. The blood test checks for antibodies to the bacteria that cause syphilis. Antibodies are proteins your body makes in response to germs and other things that can make you sick. There are two types of blood tests to check for syphilis. Both tests are necessary to make a definite diagnosis.  Nontreponemal test. ? This is usually the first test. ? This test can also detect antibodies to other proteins and may give a positive result for syphilis even if you do not have the condition (false positive).  Treponemal test. ? This test is done if you get a positive result to the nontreponemal test. ? It tests specifically for antibodies to syphilis. ? Other conditions are not likely to cause a positive result. ? This test will not reveal whether antibodies are from a previous syphilis  infection or a recent one.  If your health care provider suspects late-stage syphilis, you may have a spinal tap (lumbar puncture). This is a procedure in which a small amount of the fluid that surrounds the brain and spinal cord (cerebrospinal fluid or CSF) is removed and examined for syphilis. How do I prepare for this test? There is no preparation required for this test. What do the results mean? Your test results will be reported as either negative or positive. If the result of your syphilis test is negative, no antibodies were present at the time of the test. This could mean:  You do not have syphilis.  The antibodies have not formed yet. This can take several weeks. If it is possible that you were recently exposed to the bacteria, you may need to have the test again at a later time.  If you test positive for syphilis on the first nontreponemal test, you will most likely have the second treponemal test to confirm the diagnosis. If the result of the second test is also positive, it is likely that you have syphilis. If the result of the second test is negative, further testing may be needed to make sure you do not have syphilis.  Talk to your health care provider to discuss your results, treatment options, and if necessary, the need for more tests. It is your responsibility to obtain your test results. Ask the lab or department performing the test when and how you will get your results. Talk with your health care provider if you have any questions about your results. Talk with your health care provider to discuss your results, treatment options, and if necessary, the need for more tests. Talk with your health care provider if you have any questions about your results. This information is not intended to replace advice given to you by your health care provider. Make sure you discuss any questions you have with your health care provider. Document Released: 02/22/2004 Document Revised: 09/25/2015  Document Reviewed: 05/15/2013 Elsevier Interactive Patient Education  [employer].

## 2016-11-26 NOTE — Assessment & Plan Note (Signed)
Reassured pt that it was a false positive-- done by gyn

## 2016-11-26 NOTE — Progress Notes (Signed)
Subjective:     Kathy [name] is a 41 y.o. female and is here for a comprehensive physical exam. The patient reports problems - concerned about repeated + RPR , bruising and hair loss.  .  Social History   Social History  . Marital status: Single    Spouse name: N/A  . Number of children: N/A  . Years of education: N/A   Occupational History  .  [employer]   Social History Main Topics  . Smoking status: Former Smoker    Years: 12.00  . Smokeless tobacco: Never Used  . Alcohol use No  . Drug use: No  . Sexual activity: Yes    Partners: Male   Other Topics Concern  . Not on file   Social History Narrative  . No narrative on file   Health Maintenance  Topic Date Due  . INFLUENZA VACCINE  09/02/2016  . PAP SMEAR  01/02/2017  . TETANUS/TDAP  10/05/2020  . HIV Screening  Completed    The following portions of the patient's history were reviewed and updated as appropriate:  She  has a past medical history of HSV (herpes simplex virus) infection and Postpartum care following vaginal delivery (10/05/2010). She  does not have any pertinent problems on file. She  has a past surgical history that includes No past surgeries. Her family history includes Breast cancer in her maternal grandmother; Cancer in her maternal grandfather; Diabetes in her maternal grandfather, maternal grandmother, paternal grandfather, and paternal grandmother; Lymphoma in her mother. She  reports that she has quit smoking. She quit after 12.00 years of use. She has never used smokeless tobacco. She reports that she does not drink alcohol or use drugs. She has a current medication list which includes the following prescription(s): b complex vitamins and valacyclovir. Current Outpatient Prescriptions on File Prior to Visit  Medication Sig Dispense Refill  . valACYclovir (VALTREX) 500 MG tablet Take 500 mg by mouth daily.     No current facility-administered medications on file prior to visit.    She  has No Known Allergies..  Review of Systems Review of Systems  Constitutional: Negative for activity change, appetite change and fatigue.  HENT: Negative for hearing loss, congestion, tinnitus and ear discharge.  dentist q28m Eyes: Negative for visual disturbance (see optho q1y -- vision corrected to 20/20 with glasses).  Respiratory: Negative for cough, chest tightness and shortness of breath.   Cardiovascular: Negative for chest pain, palpitations and leg swelling.  Gastrointestinal: Negative for abdominal pain, diarrhea, constipation and abdominal distention.  Genitourinary: Negative for urgency, frequency, decreased urine volume and difficulty urinating.  Musculoskeletal: Negative for back pain, arthralgias and gait problem.  Skin: Negative for color change, pallor and rash.  Neurological: Negative for dizziness, light-headedness, numbness and headaches.  Hematological: Negative for adenopathy. Does not bruise/bleed easily.  Psychiatric/Behavioral: Negative for suicidal ideas, confusion, sleep disturbance, self-injury, dysphoric mood, decreased concentration and agitation.      Objective:    BP 108/78   Pulse 80   Temp 97.8 F (36.6 C) (Oral)   Ht 5\' 5"  (1.651 m)   Wt 207 lb (93.9 kg)   LMP 11/08/2016 (Exact Date)   SpO2 98%   BMI 34.45 kg/m  General appearance: alert, cooperative, appears stated age and no distress Head: Normocephalic, without obvious abnormality, atraumatic Eyes: conjunctivae/corneas clear. PERRL, EOM's intact. Fundi benign. Ears: normal TM's and external ear canals both ears Nose: Nares normal. Septum midline. Mucosa normal. No drainage or sinus  tenderness. Throat: lips, mucosa, and tongue normal; teeth and gums normal Neck: no adenopathy, no carotid bruit, no JVD, supple, symmetrical, trachea midline and thyroid not enlarged, symmetric, no tenderness/mass/nodules Back: symmetric, no curvature. ROM normal. No CVA tenderness. Lungs: clear to auscultation  bilaterally Breasts: gyn Heart: regular rate and rhythm, S1, S2 normal, no murmur, click, rub or gallop Abdomen: soft, non-tender; bowel sounds normal; no masses,  no organomegaly Pelvic: deferred Extremities: extremities normal, atraumatic, no cyanosis or edema Pulses: 2+ and symmetric Skin: Skin color, texture, turgor normal. No rashes or lesions Lymph nodes: Cervical, supraclavicular, and axillary nodes normal. Neurologic: Alert and oriented X 3, normal strength and tone. Normal symmetric reflexes. Normal coordination and gait    Assessment:    Healthy female exam.      Plan:    ghm utd Check labs  See After Visit Summary for Counseling Recommendations    1. Need for immunization against influenza   - Flu Vaccine QUAD 6+ mos IM (Fluarix)  2. Preventative health care See above - Lipid panel - CBC with Differential/Platelet - TSH - Comprehensive metabolic panel - POCT Urinalysis Dipstick (Automated)  3. Vitamin D deficiency   - Vitamin D (25 hydroxy)  4. Bruising Check labs  On no meds to cause bruising - INR/PT - PTT  5. False + rpr Done by gyn .

## 2016-11-27 LAB — APTT: aPTT: 29 s (ref 22–34)

## 2016-11-27 LAB — PROTIME-INR
INR: 1
PROTHROMBIN TIME: 10.3 s (ref 9.0–11.5)

## 2016-11-29 ENCOUNTER — Encounter: Payer: Self-pay | Admitting: Family Medicine

## 2016-11-30 ENCOUNTER — Telehealth: Payer: Self-pay | Admitting: Family Medicine

## 2016-11-30 ENCOUNTER — Other Ambulatory Visit: Payer: Self-pay | Admitting: *Deleted

## 2016-11-30 LAB — URINE CULTURE
MICRO NUMBER: 81196542
SPECIMEN QUALITY:: ADEQUATE

## 2016-11-30 MED ORDER — VITAMIN D (ERGOCALCIFEROL) 1.25 MG (50000 UNIT) PO CAPS: 50000.0000 [IU] | ORAL_CAPSULE | ORAL | 2 refills | Status: DC

## 2016-11-30 NOTE — Telephone Encounter (Signed)
Patient would like to know what that bacteria that in her urine is.  Does she need to be on an antibiotic?

## 2016-11-30 NOTE — Telephone Encounter (Signed)
I need sensitivities and why are we not getting # colonies any more?

## 2016-11-30 NOTE — Telephone Encounter (Signed)
Spoke with Felisa at [facility], she is going to update the system and fax results. If this is still not correct please let me know and I will call them back.

## 2016-11-30 NOTE — Telephone Encounter (Signed)
See telephone note 11/30/2016.

## 2016-11-30 NOTE — Telephone Encounter (Signed)
Patient would like to discuss results of her labs. She is also looking for a form that needed to be completed for her work. Please advise.  [phone].

## 2016-11-30 NOTE — Telephone Encounter (Signed)
Patient checking on the status of message below best # work [phone]

## 2016-12-01 ENCOUNTER — Other Ambulatory Visit: Payer: Self-pay

## 2016-12-01 MED ORDER — AMOXICILLIN 875 MG PO TABS: 875.0000 mg | ORAL_TABLET | Freq: Two times a day (BID) | ORAL | 0 refills | Status: DC

## 2016-12-01 NOTE — Telephone Encounter (Signed)
Called Pt the inform her of low Vitamin D levels Rx called into her pharmacy. During the the call CMA Senaida OresRichardson also counseled Pt on Strep B results. Pt tested positive during her urine culture. Pt had was informed of Rx being sent into pharm for treatment for that as well. Pt stated compliance and understanding.

## 2016-12-01 NOTE — Telephone Encounter (Signed)
[facility] [place] [facility] Dr. Laury AxonLowne

## 2016-12-01 NOTE — Telephone Encounter (Signed)
Patient notified of lab results for urine culture.  Amoxicillin sent to pharmacy.

## 2016-12-14 ENCOUNTER — Other Ambulatory Visit (INDEPENDENT_AMBULATORY_CARE_PROVIDER_SITE_OTHER): Payer: BLUE CROSS/BLUE SHIELD

## 2016-12-14 ENCOUNTER — Other Ambulatory Visit: Payer: Self-pay

## 2016-12-14 ENCOUNTER — Telehealth: Payer: Self-pay | Admitting: Family Medicine

## 2016-12-14 DIAGNOSIS — R8290 Unspecified abnormal findings in urine: Secondary | ICD-10-CM | POA: Diagnosis not present

## 2016-12-14 LAB — POC URINALSYSI DIPSTICK (AUTOMATED)
BILIRUBIN UA: NEGATIVE
GLUCOSE UA: NEGATIVE
Ketones, UA: NEGATIVE
LEUKOCYTES UA: NEGATIVE
NITRITE UA: NEGATIVE
Protein, UA: NEGATIVE
RBC UA: NEGATIVE
Spec Grav, UA: >=1.03 — AB (ref 1.010–1.025)
UROBILINOGEN UA: 0.2 U/dL
pH, UA: 6 (ref 5.0–8.0)

## 2016-12-14 NOTE — Telephone Encounter (Signed)
Per Dr. Laury AxonLowne ok to put in repeat urine.  Patient will be in today.

## 2016-12-14 NOTE — Telephone Encounter (Signed)
Relation to pt: self  Call back number: [phone] Pharmacy:  Reason for call:  Patient last seen 11/26/16 and states urine was collected due to physical appointment. Patient states results showed patient had a UTI therefore antibiotics was prescribed, patient states she never had any symptoms and would like to know if PCP would like her to schedule repeat urine, lab appointment only to confirm she no longer has a UTI, please advise

## 2016-12-15 ENCOUNTER — Other Ambulatory Visit: Payer: Self-pay | Admitting: Family Medicine

## 2016-12-18 ENCOUNTER — Encounter: Payer: Self-pay | Admitting: Family Medicine

## 2017-01-27 ENCOUNTER — Telehealth: Payer: Self-pay | Admitting: Family Medicine

## 2017-01-27 NOTE — Telephone Encounter (Signed)
Copied from CRM [phone]. Topic: Quick Communication - See Telephone Encounter >> Jan 27, 2017  2:30 PM Louie BunPalacios Medina, Rosey Batheresa D wrote: CRM for notification. See Telephone encounter for: 01/27/17. Patient called and would like to know if the paper work she left is available for pickup or does she need to bring another one. She said its a health assessment paperwork. Please call patient back, thanks.

## 2017-02-01 NOTE — Telephone Encounter (Signed)
Patient came into office and form was filled out and given back to patient.

## 2017-02-01 NOTE — Telephone Encounter (Signed)
This form was given to Dr. Laury AxonLowne during an OV per patient, and she stated that she could not get it completed until lab results came in per Dr. Laury AxonLowne. I have not been given the paperwork/SLS 12/31

## 2017-02-11 ENCOUNTER — Ambulatory Visit (HOSPITAL_BASED_OUTPATIENT_CLINIC_OR_DEPARTMENT_OTHER)
Admission: RE | Admit: 2017-02-11 | Discharge: 2017-02-11 | Disposition: A | Discharge status: Home / Self Care | Payer: BLUE CROSS/BLUE SHIELD | Source: Ambulatory Visit | Attending: Family Medicine | Admitting: Family Medicine

## 2017-02-11 ENCOUNTER — Encounter: Payer: Self-pay | Admitting: Family Medicine

## 2017-02-11 ENCOUNTER — Ambulatory Visit: Payer: BLUE CROSS/BLUE SHIELD | Admitting: Family Medicine

## 2017-02-11 VITALS — BP 120/70 | HR 98 | Temp 98.4°F | Resp 16 | Ht 65.0 in | Wt 207.6 lb

## 2017-02-11 DIAGNOSIS — R431 Parosmia: Secondary | ICD-10-CM | POA: Diagnosis not present

## 2017-02-11 DIAGNOSIS — R079 Chest pain, unspecified: Secondary | ICD-10-CM | POA: Insufficient documentation

## 2017-02-11 HISTORY — DX: Chest pain, unspecified: R07.9

## 2017-02-11 NOTE — Assessment & Plan Note (Addendum)
ekg nsr Check echo  Check cxr  Consider cardiology  Try xanax 1/2 tab prn stress Go to ER if chest pain returns

## 2017-02-11 NOTE — Progress Notes (Signed)
Patient ID: Kathy [name], female   DOB: 25-Sep-1975, 42 y.o.   MRN: [id number]    Subjective:  I acted as a Neurosurgeon for Dr. Zola Button.  Apolonio Schneiders, CMA   Patient ID: Kathy [name], female    DOB: 10/05/1975, 42 y.o.   MRN: [id number]  Chief Complaint  Patient presents with  . constant smell of smoke all the time when breathing  . Chest Pain    HPI  Patient is in today for chest pain and that she has a constant smell of smoke in her nose when breathing. Chest pain occurs few x a week.  Pain lasts a few hours -- does not occur at a specific time or day.   Pt c/o fatigue -- she is going to bed about 10-11 and gets up about 7.    She does not always fall asleep right away.    Patient Care Team: Zola Button, Grayling Congress, DO as PCP - General (Family Medicine) Arlana Lindau, NP as Nurse Practitioner (Obstetrics and Gynecology)   Past Medical History:  Diagnosis Date  . HSV (herpes simplex virus) infection   . Postpartum care following vaginal delivery 10/05/2010    Past Surgical History:  Procedure Laterality Date  . NO PAST SURGERIES      Family History  Problem Relation Age of Onset  . Lymphoma Mother   . Breast cancer Maternal Grandmother   . Diabetes Maternal Grandmother   . Diabetes Maternal Grandfather   . Cancer Maternal Grandfather        Unsure if Prostate or colon  . Diabetes Paternal Grandmother   . Diabetes Paternal Grandfather     Social History   Socioeconomic History  . Marital status: Single    Spouse name: Not on file  . Number of children: Not on file  . Years of education: Not on file  . Highest education level: Not on file  Social Needs  . Financial resource strain: Not on file  . Food insecurity - worry: Not on file  . Food insecurity - inability: Not on file  . Transportation needs - medical: Not on file  . Transportation needs - non-medical: Not on file  Occupational History    Employer: [employer]  Tobacco Use  . Smoking status:  Former Smoker    Years: 12.00  . Smokeless tobacco: Never Used  Substance and Sexual Activity  . Alcohol use: No  . Drug use: No  . Sexual activity: Yes    Partners: Male  Other Topics Concern  . Not on file  Social History Narrative  . Not on file    Outpatient Medications Prior to Visit  Medication Sig Dispense Refill  . b complex vitamins capsule Take 1 capsule by mouth daily.    . Multiple Vitamin (MULTIVITAMIN) tablet Take 1 tablet by mouth daily.    . valACYclovir (VALTREX) 500 MG tablet Take 500 mg by mouth daily.    . Vitamin D, Ergocalciferol, (DRISDOL) 50000 units CAPS capsule Take 1 capsule (50,000 Units total) by mouth every 7 (seven) days. 4 capsule 2  . amoxicillin (AMOXIL) 875 MG tablet Take 1 tablet (875 mg total) by mouth 2 (two) times daily. For 7 days 14 tablet 0   No facility-administered medications prior to visit.     No Known Allergies  Review of Systems  Constitutional: Positive for malaise/fatigue. Negative for fever.  HENT: Negative for congestion.   Eyes: Negative for blurred vision.  Respiratory: Negative for cough and  shortness of breath.   Cardiovascular: Positive for chest pain. Negative for palpitations and leg swelling.  Gastrointestinal: Negative for vomiting.  Musculoskeletal: Negative for back pain.  Skin: Negative for rash.  Neurological: Negative for loss of consciousness and headaches.       Objective:    Physical Exam  Constitutional: She is oriented to person, place, and time. She appears well-developed and well-nourished.  HENT:  Head: Normocephalic and atraumatic.  Eyes: Conjunctivae and EOM are normal.  Neck: Normal range of motion. Neck supple. No JVD present. Carotid bruit is not present. No thyromegaly present.  Cardiovascular: Normal rate, regular rhythm and normal heart sounds.  No murmur heard. Pulmonary/Chest: Effort normal and breath sounds normal. No respiratory distress. She has no wheezes. She has no rales. She  exhibits no tenderness.  Musculoskeletal: She exhibits no edema.  Neurological: She is alert and oriented to person, place, and time.  Psychiatric: She has a normal mood and affect.  Nursing note and vitals reviewed.   BP 120/70 (BP Location: Right Arm, Cuff Size: Normal)   Pulse 98   Temp 98.4 F (36.9 C) (Oral)   Resp 16   Ht 5\' 5"  (1.651 m)   Wt 207 lb 9.6 oz (94.2 kg)   LMP 02/02/2017   SpO2 98%   BMI 34.55 kg/m  Wt Readings from Last 3 Encounters:  02/11/17 207 lb 9.6 oz (94.2 kg)  11/26/16 207 lb (93.9 kg)  11/26/15 207 lb (93.9 kg)   BP Readings from Last 3 Encounters:  02/11/17 120/70  11/26/16 108/78  11/26/15 120/81     Immunization History  Administered Date(s) Administered  . Influenza,inj,Quad PF,6+ Mos 11/26/2015, 11/26/2016  . Tdap 10/06/2010    Health Maintenance  Topic Date Due  . PAP SMEAR  01/02/2017  . TETANUS/TDAP  10/05/2020  . INFLUENZA VACCINE  Completed  . HIV Screening  Completed    Lab Results  Component Value Date   WBC 7.8 11/26/2016   HGB 13.4 11/26/2016   HCT 41.7 11/26/2016   PLT 181.0 11/26/2016   GLUCOSE 92 11/26/2016   CHOL 142 11/26/2016   TRIG 53.0 11/26/2016   HDL 55.90 11/26/2016   LDLCALC 76 11/26/2016   ALT 16 11/26/2016   AST 17 11/26/2016   NA 137 11/26/2016   K 4.3 11/26/2016   CL 104 11/26/2016   CREATININE 0.81 11/26/2016   BUN 11 11/26/2016   CO2 28 11/26/2016   TSH 1.54 11/26/2016   INR 1.0 11/26/2016    Lab Results  Component Value Date   TSH 1.54 11/26/2016   Lab Results  Component Value Date   WBC 7.8 11/26/2016   HGB 13.4 11/26/2016   HCT 41.7 11/26/2016   MCV 85.2 11/26/2016   PLT 181.0 11/26/2016   Lab Results  Component Value Date   NA 137 11/26/2016   K 4.3 11/26/2016   CO2 28 11/26/2016   GLUCOSE 92 11/26/2016   BUN 11 11/26/2016   CREATININE 0.81 11/26/2016   BILITOT 0.4 11/26/2016   ALKPHOS 36 (L) 11/26/2016   AST 17 11/26/2016   ALT 16 11/26/2016   PROT 7.0 11/26/2016     ALBUMIN 4.1 11/26/2016   CALCIUM 9.2 11/26/2016   GFR 101.21 11/26/2016   Lab Results  Component Value Date   CHOL 142 11/26/2016   Lab Results  Component Value Date   HDL 55.90 11/26/2016   Lab Results  Component Value Date   LDLCALC 76 11/26/2016   Lab Results  Component Value Date   TRIG 53.0 11/26/2016   Lab Results  Component Value Date   CHOLHDL 3 11/26/2016   No results found for: HGBA1C    ekg--nsr    Assessment & Plan:   Problem List Items Addressed This Visit      Unprioritized   Abnormal smell    Try saline spray,  nasacort and antihistamine otc Will refer to ent at your request  rto prn       Relevant Orders   Ambulatory referral to ENT   Chest pain - Primary    ekg nsr Check echo  Check cxr  Consider cardiology  Try xanax 1/2 tab prn stress Go to ER if chest pain returns      Relevant Orders   EKG 12-Lead (Completed)   ECHOCARDIOGRAM COMPLETE   DG Chest 2 View      I have discontinued Gaila N. Perfecto's amoxicillin. I am also having her maintain her valACYclovir, b complex vitamins, Vitamin D (Ergocalciferol), and multivitamin.  No orders of the defined types were placed in this encounter.   CMA served as Neurosurgeonscribe during this visit. History, Physical and Plan performed by medical provider. Documentation and orders reviewed and attested to.  Donato SchultzYvonne R Lowne Chase, DO

## 2017-02-11 NOTE — Assessment & Plan Note (Signed)
Try saline spray,  nasacort and antihistamine otc Will refer to ent at your request  rto prn

## 2017-02-11 NOTE — Patient Instructions (Signed)

## 2017-02-21 ENCOUNTER — Other Ambulatory Visit: Payer: Self-pay | Admitting: Family Medicine

## 2017-02-23 ENCOUNTER — Other Ambulatory Visit (HOSPITAL_BASED_OUTPATIENT_CLINIC_OR_DEPARTMENT_OTHER): Payer: Self-pay

## 2017-03-08 ENCOUNTER — Ambulatory Visit (HOSPITAL_BASED_OUTPATIENT_CLINIC_OR_DEPARTMENT_OTHER)
Admission: RE | Admit: 2017-03-08 | Discharge: 2017-03-08 | Disposition: A | Discharge status: Home / Self Care | Payer: BLUE CROSS/BLUE SHIELD | Source: Ambulatory Visit | Attending: Family Medicine | Admitting: Family Medicine

## 2017-03-08 DIAGNOSIS — R079 Chest pain, unspecified: Secondary | ICD-10-CM | POA: Insufficient documentation

## 2017-03-08 NOTE — Progress Notes (Signed)
Echocardiogram 2D Echocardiogram has been performed.  Dorothey BasemanReel, Josef Tourigny M 03/08/2017, 3:50 PM

## 2017-03-10 ENCOUNTER — Encounter: Payer: Self-pay | Admitting: Family Medicine

## 2017-03-11 NOTE — Telephone Encounter (Signed)
It could be musculoskeletal,  Or stress -----the echo is just one test ---- there are others that can be done If her pain cont --- refer to cardiology

## 2017-03-17 NOTE — Telephone Encounter (Signed)
I had recommended in the ov note that she see cardiology if pain con't If it is stress than counselor may help,  We can also discuss med options in ov

## 2017-03-19 ENCOUNTER — Other Ambulatory Visit: Payer: Self-pay | Admitting: Family Medicine

## 2017-04-05 ENCOUNTER — Encounter: Payer: Self-pay | Admitting: Family Medicine

## 2017-04-05 ENCOUNTER — Ambulatory Visit: Payer: BLUE CROSS/BLUE SHIELD | Admitting: Family Medicine

## 2017-04-05 ENCOUNTER — Ambulatory Visit: Payer: Self-pay | Admitting: *Deleted

## 2017-04-05 VITALS — BP 118/84 | HR 114 | Temp 97.5°F | Ht 65.0 in | Wt 207.5 lb

## 2017-04-05 DIAGNOSIS — R55 Syncope and collapse: Secondary | ICD-10-CM

## 2017-04-05 NOTE — Telephone Encounter (Signed)
Patient is concerned that she had a severe dizzy spell today that was accompanied by a headache while standing is line at lunch today. She states she felt she would have fainted if she had not sat down. She is extremely worried- and wanted to be seen today. She states she can make the 4:00 appointment today- office made aware. Reason for Disposition . [1] MODERATE dizziness (e.g., interferes with normal activities) AND [2] has NOT been evaluated by physician for this  (Exception: dizziness caused by heat exposure, sudden standing, or poor fluid intake)  Answer Assessment - Initial Assessment Questions 1. DESCRIPTION: "Describe your dizziness."     shaky and weak- dizzy and everything was spinning 2. LIGHTHEADED: "Do you feel lightheaded?" (e.g., somewhat faint, woozy, weak upon standing)     Yes- patient had to sit 3. VERTIGO: "Do you feel like either you or the room is spinning or tilting?" (i.e. vertigo)     Spinning- patient had to hold on 4. SEVERITY: "How bad is it?"  "Do you feel like you are going to faint?" "Can you stand and walk?"   - MILD - walking normally   - MODERATE - interferes with normal activities (e.g., work, school)    - SEVERE - unable to stand, requires support to walk, feels like passing out now.      Had she kept spinning - she thinks she might have- had to sit- hands shaking- nausea Severe during the moment 5. ONSET:  "When did the dizziness begin?"     At lunch today- 2:40 6. AGGRAVATING FACTORS: "Does anything make it worse?" (e.g., standing, change in head position)     Movement 7. HEART RATE: "Can you tell me your heart rate?" "How many beats in 15 seconds?"  (Note: not all patients can do this)       Patient reports she had a headache earlier today-  18  8. CAUSE: "What do you think is causing the dizziness?"     unknown 9. RECURRENT SYMPTOM: "Have you had dizziness before?" If so, ask: "When was the last time?" "What happened that time?"     Yes- not ever  this bad 10. OTHER SYMPTOMS: "Do you have any other symptoms?" (e.g., fever, chest pain, vomiting, diarrhea, bleeding)       Headache, nausea 11. PREGNANCY: "Is there any chance you are pregnant?" "When was your last menstrual period?"     No-  LMP- 2/24  Protocols used: DIZZINESS [facility]- LIGHTHEADEDNESS-A-AH

## 2017-04-05 NOTE — Patient Instructions (Addendum)
Push lots of fluids.  Eat regularly.   Vasovagal Syncope, Adult Syncope, which is commonly known as fainting or passing out, is a temporary loss of consciousness. It occurs when the blood flow to the brain is reduced. Vasovagal syncope, also called neurocardiogenic syncope, is a fainting spell that happens when blood flow to the brain is reduced because of a sudden drop in heart rate and blood pressure. Vasovagal syncope is usually harmless. However, you can get injured if you fall during a fainting spell. What are the causes? This condition is caused by a drop in heart rate and blood pressure, usually in response to a trigger. Many things and situations can trigger an episode, including:  Pain.  Fear.  The sight of blood. This may occur during medical procedures, such as when blood is being drawn from a vein.  Common activities, such as coughing, swallowing, stretching, or going to the bathroom.  Emotional stress.  Being in a confined space.  Prolonged standing, especially in a warm environment.  Lack of sleep or rest.  Not eating for a long time.  Not drinking enough liquids.  Recent illness.  Drinking alcohol.  Taking drugs that affect blood pressure, such as marijuana, cocaine, opiates, or inhalants.  What are the signs or symptoms? Before a fainting episode, you may:  Feel dizzy or light-headed.  Become pale.  Sense that you are going to faint.  Feel like the room is spinning.  Only see directly ahead (tunnel vision).  Feel sick to your stomach (nauseous).  See spots.  Slowly lose vision.  Hear ringing in your ears.  Have a headache.  Feel warm and sweaty.  Feel a sensation of pins and needles.  During the fainting spell, you may twitch or make jerky movements. Fainting spells usually last no longer than a few minutes before you wake up. If you get up too quickly before your body can recover, you may faint again. How is this diagnosed? This  condition is diagnosed based on your symptoms, your medical history, and a physical exam. Tests may be done to rule out other causes of fainting. Tests may include:  Blood tests.  Heart tests, such as an electrocardiogram (ECG), echocardiogram, or electrophysiology study.  A test to check your response to changes in position (tilt table test).  How is this treated? Usually, treatment is not needed for this condition. Your health care provider may suggest ways to help prevent fainting episodes. These may include:  Drinking additional fluids if you are exposed to a trigger.  Sitting or lying down if you notice signs that an episode is coming.  If your fainting spells continue, your health care provider may recommend that you:  Take medicines to prevent fainting or to help reduce further episodes of fainting.  Do certain exercises.  Wear compression stockings.  Have surgery to place a pacemaker in your body (rare).  Follow these instructions at home:  Learn to identify the signs that an episode is coming.  Sit or lie down at the first sign of a fainting spell. If you sit down, put your head down between your legs. If you lie down, swing your legs up in the air to increase blood flow to the brain.  Avoid hot tubs and saunas.  Avoid standing for a long time. If you have to stand for a long time, try: ? Crossing your legs. ? Flexing and stretching your leg muscles. ? Squatting. ? Moving your legs. ? Bending over.  Drink  enough fluid to keep your urine clear or pale yellow.  Make changes to your diet that your health care provider recommends. You may be told to: ? Avoid caffeine. ? Eat more salt.  Take over-the-counter and prescription medicines only as told by your health care provider. Contact a health care provider if:  You continue to have fainting spells despite treatment.  You faint more often despite treatment.  You lose consciousness for more than a few  minutes.  You faint during or after exercising or after being startled.  You have twitching or jerky movements for longer than a few seconds during a fainting spell.  You have an episode of twitching or jerky movements without fainting. Get help right away if:  A fainting spell leads to an injury or bleeding.  You have new symptoms that occur with the fainting spells, such as: ? Shortness of breath. ? Chest pain. ? Irregular heartbeat.  You twitch or make jerky movements for more than 5 minutes.  You twitch or make jerky movements during more than one fainting spell. This information is not intended to replace advice given to you by your health care provider. Make sure you discuss any questions you have with your health care provider. Document Released: 01/06/2012 Document Revised: 07/03/2015 Document Reviewed: 11/17/2014 Elsevier Interactive Patient Education  [employer].

## 2017-04-05 NOTE — Telephone Encounter (Signed)
FYI. Pt has appt w/ Wendling at 4.

## 2017-04-05 NOTE — Progress Notes (Signed)
Chief Complaint  Patient presents with  . Dizziness    Kathy [name] is 42 y.o. pt here for dizziness.  Duration: Happened earlier today Pass out? No- felt like it was approx 1 min Spinning? Yes, lasted for approximately  Recent illness/fever? No Headache? Yes Neurologic signs? No Change in PO intake? No  No med changes, injury, migraine hx, vision changes, palpitations, heart disease.   ROS:  Neuro: As noted in HPI Eyes: No vision changes  Past Medical History:  Diagnosis Date  . HSV (herpes simplex virus) infection   . Postpartum care following vaginal delivery 10/05/2010    Family History  Problem Relation Age of Onset  . Lymphoma Mother   . Breast cancer Maternal Grandmother   . Diabetes Maternal Grandmother   . Diabetes Maternal Grandfather   . Cancer Maternal Grandfather        Unsure if Prostate or colon  . Diabetes Paternal Grandmother   . Diabetes Paternal Grandfather     Allergies as of 04/05/2017   No Known Allergies     Medication List        Accurate as of 04/05/17  5:11 PM. Always use your most recent med list.          b complex vitamins capsule Take 1 capsule by mouth daily.   chlorhexidine 0.12 % solution Commonly known as:  PERIDEX Use as directed 15 mLs in the mouth or throat 2 (two) times daily.   multivitamin tablet Take 1 tablet by mouth daily.   penicillin v potassium 500 MG tablet Commonly known as:  VEETID Take 500 mg by mouth 4 (four) times daily.   valACYclovir 500 MG tablet Commonly known as:  VALTREX Take 500 mg by mouth daily.   Vitamin D (Ergocalciferol) 50000 units Caps capsule Commonly known as:  DRISDOL TAKE 1 CAPSULE BY MOUTH EVERY 7 DAYS       BP 118/84 (BP Location: Left Arm)   Pulse (!) 114   Temp (!) 97.5 F (36.4 C) (Oral)   Ht 5\' 5"  (1.651 m)   Wt 207 lb 8 oz (94.1 kg)   SpO2 96%   BMI 34.53 kg/m  General: Awake, alert, appears stated age Eyes: PERRLA, EOMi Ears: Patent, TM's neg b/l Heart:  RRR, no murmurs, no carotid bruits Lungs: CTAB, no accessory muscle use MSK: 5/5 strength throughout, gait normal, mild ttp over cerv paraspinal msc Neuro: No cerebellar signs, patellar reflex 3/4 b/l wo clonus, calcaneal reflex 1/4 b/l wo clonus, biceps reflex 2/4 b/l wo clonus; Dix-Hall-Pike negative b/l. Psych: Age appropriate judgment and insight, normal mood and affect  Pre-syncope - Plan: TSH, CBC, Comprehensive metabolic panel  Orders as above. Ck labs. Likely vasovagal given feeling of warmth, age, and nausea. Less likely is vestibular migraine, though what she describes sounds more like tension. No red flag signs necessitating imaging or emergent evaluation. Stay well hydrated, eat regularly. F/u prn. Pt voiced understanding and agreement to the plan.  Jilda Rocheicholas Paul WoodfordWendling, DO 04/05/17 5:11 PM

## 2017-04-05 NOTE — Progress Notes (Signed)
Pre visit review using our clinic review tool, if applicable. No additional management support is needed unless otherwise documented below in the visit note. 

## 2017-04-06 LAB — COMPREHENSIVE METABOLIC PANEL
ALK PHOS: 41 U/L (ref 39–117)
ALT: 14 U/L (ref 0–35)
AST: 16 U/L (ref 0–37)
Albumin: 3.9 g/dL (ref 3.5–5.2)
BUN: 12 mg/dL (ref 6–23)
CALCIUM: 9.4 mg/dL (ref 8.4–10.5)
CO2: 30 meq/L (ref 19–32)
Chloride: 101 mEq/L (ref 96–112)
Creatinine, Ser: 0.84 mg/dL (ref 0.40–1.20)
GFR: 96.87 mL/min (ref >60.00)
GLUCOSE: 109 mg/dL — AB (ref 70–99)
POTASSIUM: 4.1 meq/L (ref 3.5–5.1)
Sodium: 137 mEq/L (ref 135–145)
TOTAL PROTEIN: 6.9 g/dL (ref 6.0–8.3)
Total Bilirubin: 0.2 mg/dL (ref 0.2–1.2)

## 2017-04-06 LAB — TSH: TSH: 1.62 u[IU]/mL (ref 0.35–4.50)

## 2017-04-06 LAB — CBC
HEMATOCRIT: 38.4 % (ref 36.0–46.0)
HEMOGLOBIN: 12.5 g/dL (ref 12.0–15.0)
MCHC: 32.6 g/dL (ref 30.0–36.0)
MCV: 84.5 fl (ref 78.0–100.0)
Platelets: 203 10*3/uL (ref 150.0–400.0)
RBC: 4.54 Mil/uL (ref 3.87–5.11)
RDW: 13.8 % (ref 11.5–15.5)
WBC: 9.7 10*3/uL (ref 4.0–10.5)

## 2017-04-14 ENCOUNTER — Other Ambulatory Visit: Payer: Self-pay | Admitting: Family Medicine

## 2017-04-15 NOTE — Telephone Encounter (Signed)
Do you want to refill.  Last vit d done 10/18

## 2017-05-16 ENCOUNTER — Other Ambulatory Visit: Payer: Self-pay | Admitting: Family Medicine

## 2017-06-13 ENCOUNTER — Other Ambulatory Visit: Payer: Self-pay | Admitting: Family Medicine

## 2017-06-21 DIAGNOSIS — J029 Acute pharyngitis, unspecified: Secondary | ICD-10-CM | POA: Diagnosis not present

## 2017-07-13 DIAGNOSIS — R300 Dysuria: Secondary | ICD-10-CM | POA: Diagnosis not present

## 2017-07-13 DIAGNOSIS — Z114 Encounter for screening for human immunodeficiency virus [HIV]: Secondary | ICD-10-CM | POA: Diagnosis not present

## 2017-07-13 DIAGNOSIS — N760 Acute vaginitis: Secondary | ICD-10-CM | POA: Diagnosis not present

## 2017-07-13 DIAGNOSIS — Z113 Encounter for screening for infections with a predominantly sexual mode of transmission: Secondary | ICD-10-CM | POA: Diagnosis not present

## 2017-07-13 DIAGNOSIS — Z1159 Encounter for screening for other viral diseases: Secondary | ICD-10-CM | POA: Diagnosis not present

## 2017-07-13 DIAGNOSIS — Z118 Encounter for screening for other infectious and parasitic diseases: Secondary | ICD-10-CM | POA: Diagnosis not present

## 2017-07-15 ENCOUNTER — Other Ambulatory Visit: Payer: Self-pay | Admitting: Family Medicine

## 2017-07-16 NOTE — Telephone Encounter (Signed)
Last vit d check 11/26/16 24.96

## 2017-08-10 ENCOUNTER — Other Ambulatory Visit: Payer: Self-pay | Admitting: Family Medicine

## 2017-09-08 ENCOUNTER — Other Ambulatory Visit: Payer: Self-pay | Admitting: Family Medicine

## 2017-10-02 ENCOUNTER — Other Ambulatory Visit: Payer: Self-pay | Admitting: Family Medicine

## 2017-10-05 NOTE — Telephone Encounter (Signed)
Last vitamin checked 10/18 do you want to refill?

## 2017-11-05 ENCOUNTER — Other Ambulatory Visit: Payer: Self-pay | Admitting: *Deleted

## 2017-11-05 DIAGNOSIS — E559 Vitamin D deficiency, unspecified: Secondary | ICD-10-CM

## 2017-11-05 NOTE — Telephone Encounter (Signed)
[facility] [address] faxed a request for vit d [id number]. Vitamin d last checked last October.  Do you want to refill?

## 2017-11-08 MED ORDER — VITAMIN D (ERGOCALCIFEROL) 1.25 MG (50000 UNIT) PO CAPS: ORAL_CAPSULE | ORAL | 0 refills | Status: DC

## 2017-11-08 NOTE — Addendum Note (Signed)
Addended by: Valentina Gu R on: 11/08/2017 02:03 PM   Modules accepted: Orders

## 2017-11-08 NOTE — Telephone Encounter (Signed)
Refill and place lab order

## 2017-12-07 ENCOUNTER — Encounter: Payer: Self-pay | Admitting: Family Medicine

## 2017-12-07 ENCOUNTER — Ambulatory Visit (INDEPENDENT_AMBULATORY_CARE_PROVIDER_SITE_OTHER): Payer: BLUE CROSS/BLUE SHIELD | Admitting: Family Medicine

## 2017-12-07 VITALS — BP 118/70 | HR 78 | Temp 98.3°F | Resp 16 | Ht 65.0 in | Wt 224.8 lb

## 2017-12-07 DIAGNOSIS — Z0000 Encounter for general adult medical examination without abnormal findings: Secondary | ICD-10-CM | POA: Diagnosis not present

## 2017-12-07 DIAGNOSIS — M25562 Pain in left knee: Secondary | ICD-10-CM

## 2017-12-07 DIAGNOSIS — M654 Radial styloid tenosynovitis [de Quervain]: Secondary | ICD-10-CM | POA: Diagnosis not present

## 2017-12-07 DIAGNOSIS — M25561 Pain in right knee: Secondary | ICD-10-CM | POA: Diagnosis not present

## 2017-12-07 DIAGNOSIS — G8929 Other chronic pain: Secondary | ICD-10-CM

## 2017-12-07 NOTE — Progress Notes (Signed)
Subjective:     Kathy [name] is a 42 y.o. female and is here for a comprehensive physical exam. The patient reports problems - R hand pain esp in thumb and index finger,  her knees hurt as well .  No known injury.  She works in office on a computer -- her mouse and phone tend to make the pain worse.    Social History   Socioeconomic History  . Marital status: Single    Spouse name: Not on file  . Number of children: Not on file  . Years of education: Not on file  . Highest education level: Not on file  Occupational History    Employer: [employer]  Social Needs  . Financial resource strain: Not on file  . Food insecurity:    Worry: Not on file    Inability: Not on file  . Transportation needs:    Medical: Not on file    Non-medical: Not on file  Tobacco Use  . Smoking status: Former Smoker    Years: 12.00  . Smokeless tobacco: Never Used  Substance and Sexual Activity  . Alcohol use: Yes    Comment: rare on ocassion  . Drug use: No  . Sexual activity: Yes    Partners: Male  Lifestyle  . Physical activity:    Days per week: Not on file    Minutes per session: Not on file  . Stress: Not on file  Relationships  . Social connections:    Talks on phone: Not on file    Gets together: Not on file    Attends religious service: Not on file    Active member of club or organization: Not on file    Attends meetings of clubs or organizations: Not on file    Relationship status: Not on file  . Intimate partner violence:    Fear of current or ex partner: Not on file    Emotionally abused: Not on file    Physically abused: Not on file    Forced sexual activity: Not on file  Other Topics Concern  . Not on file  Social History Narrative  . Not on file   Health Maintenance  Topic Date Due  . PAP SMEAR  01/02/2017  . TETANUS/TDAP  10/05/2020  . INFLUENZA VACCINE  Completed  . HIV Screening  Completed    The following portions of the patient's history were reviewed  and updated as appropriate: She  has a past medical history of HSV (herpes simplex virus) infection and Postpartum care following vaginal delivery (10/05/2010). She does not have any pertinent problems on file. She  has a past surgical history that includes No past surgeries. Her family history includes Breast cancer in her maternal grandmother; Cancer in her maternal grandfather; Diabetes in her maternal grandfather, maternal grandmother, paternal grandfather, and paternal grandmother; Lymphoma in her mother. She  reports that she has quit smoking. She quit after 12.00 years of use. She has never used smokeless tobacco. She reports that she drinks alcohol. She reports that she does not use drugs. She has a current medication list which includes the following prescription(s): b complex vitamins, multivitamin, and valacyclovir. Current Outpatient Medications on File Prior to Visit  Medication Sig Dispense Refill  . b complex vitamins capsule Take 1 capsule by mouth daily.    . Multiple Vitamin (MULTIVITAMIN) tablet Take 1 tablet by mouth daily.    . valACYclovir (VALTREX) 500 MG tablet Take 500 mg by mouth daily.  No current facility-administered medications on file prior to visit.    She has No Known Allergies..  Review of Systems Review of Systems  Constitutional: Negative for activity change, appetite change and fatigue.  HENT: Negative for hearing loss, congestion, tinnitus and ear discharge.  dentist q99m Eyes: Negative for visual disturbance (see optho q1y -- vision corrected to 20/20 with glasses).  Respiratory: Negative for cough, chest tightness and shortness of breath.   Cardiovascular: Negative for chest pain, palpitations and leg swelling.  Gastrointestinal: Negative for abdominal pain, diarrhea, constipation and abdominal distention.  Genitourinary: Negative for urgency, frequency, decreased urine volume and difficulty urinating.  Musculoskeletal: Negative for back pain,  arthralgias and gait problem.  Skin: Negative for color change, pallor and rash.  Neurological: Negative for dizziness, light-headedness, numbness and headaches.  Hematological: Negative for adenopathy. Does not bruise/bleed easily.  Psychiatric/Behavioral: Negative for suicidal ideas, confusion, sleep disturbance, self-injury, dysphoric mood, decreased concentration and agitation.       Objective:    BP 118/70 (BP Location: Left Arm, Cuff Size: Large)   Pulse 78   Temp 98.3 F (36.8 C) (Oral)   Resp 16   Ht 5\' 5"  (1.651 m)   Wt 224 lb 12.8 oz (102 kg)   LMP 11/13/2017   SpO2 98%   BMI 37.41 kg/m  General appearance: alert, cooperative, appears stated age and no distress Head: Normocephalic, without obvious abnormality, atraumatic Eyes: conjunctivae/corneas clear. PERRL, EOM's intact. Fundi benign. Ears: normal TM's and external ear canals both ears Nose: Nares normal. Septum midline. Mucosa normal. No drainage or sinus tenderness. Throat: lips, mucosa, and tongue normal; teeth and gums normal Neck: no adenopathy, no carotid bruit, no JVD, supple, symmetrical, trachea midline and thyroid not enlarged, symmetric, no tenderness/mass/nodules Back: symmetric, no curvature. ROM normal. No CVA tenderness. Lungs: clear to auscultation bilaterally Breasts: gyn Heart: regular rate and rhythm, S1, S2 normal, no murmur, click, rub or gallop Abdomen: soft, non-tender; bowel sounds normal; no masses,  no organomegaly Pelvic: deferred --gyn Extremities: crepitus both knees, no swelling , R thumb-- pain / numbness in R thumb and 1st metacarpal  Pulses: 2+ and symmetric Skin: Skin color, texture, turgor normal. No rashes or lesions Lymph nodes: Cervical, supraclavicular, and axillary nodes normal. Neurologic: Alert and oriented X 3, normal strength and tone. Normal symmetric reflexes. Normal coordination and gait    Assessment:    Healthy female exam.      Plan:    ghm utd Check  labs See After Visit Summary for Counseling Recommendations    1. Preventative health care See above  - CBC with Differential/Platelet - Comprehensive metabolic panel - Lipid panel - VITAMIN D 25 Hydroxy (Vit-D Deficiency, Fractures) - TSH  2. Chronic pain of both knees nsaid prn Refer to sport med--  No known injury - Ambulatory referral to Sports Medicine  3. De Quervain's disease (tenosynovitis) R hand-- suspect tendonitis  - Ambulatory referral to Sports Medicine

## 2017-12-07 NOTE — Patient Instructions (Signed)
Preventive Care 40-64 Years, Female Preventive care refers to lifestyle choices and visits with your health care provider that can promote health and wellness. What does preventive care include?  A yearly physical exam. This is also called an annual well check.  Dental exams once or twice a year.  Routine eye exams. Ask your health care provider how often you should have your eyes checked.  Personal lifestyle choices, including: ? Daily care of your teeth and gums. ? Regular physical activity. ? Eating a healthy diet. ? Avoiding tobacco and drug use. ? Limiting alcohol use. ? Practicing safe sex. ? Taking low-dose aspirin daily starting at age 58. ? Taking vitamin and mineral supplements as recommended by your health care provider. What happens during an annual well check? The services and screenings done by your health care provider during your annual well check will depend on your age, overall health, lifestyle risk factors, and family history of disease. Counseling Your health care provider may ask you questions about your:  Alcohol use.  Tobacco use.  Drug use.  Emotional well-being.  Home and relationship well-being.  Sexual activity.  Eating habits.  Work and work Statistician.  Method of birth control.  Menstrual cycle.  Pregnancy history.  Screening You may have the following tests or measurements:  Height, weight, and BMI.  Blood pressure.  Lipid and cholesterol levels. These may be checked every 5 years, or more frequently if you are over 81 years old.  Skin check.  Lung cancer screening. You may have this screening every year starting at age 78 if you have a 30-pack-year history of smoking and currently smoke or have quit within the past 15 years.  Fecal occult blood test (FOBT) of the stool. You may have this test every year starting at age 65.  Flexible sigmoidoscopy or colonoscopy. You may have a sigmoidoscopy every 5 years or a colonoscopy  every 10 years starting at age 30.  Hepatitis C blood test.  Hepatitis B blood test.  Sexually transmitted disease (STD) testing.  Diabetes screening. This is done by checking your blood sugar (glucose) after you have not eaten for a while (fasting). You may have this done every 1-3 years.  Mammogram. This may be done every 1-2 years. Talk to your health care provider about when you should start having regular mammograms. This may depend on whether you have a family history of breast cancer.  BRCA-related cancer screening. This may be done if you have a family history of breast, ovarian, tubal, or peritoneal cancers.  Pelvic exam and Pap test. This may be done every 3 years starting at age 80. Starting at age 36, this may be done every 5 years if you have a Pap test in combination with an HPV test.  Bone density scan. This is done to screen for osteoporosis. You may have this scan if you are at high risk for osteoporosis.  Discuss your test results, treatment options, and if necessary, the need for more tests with your health care provider. Vaccines Your health care provider may recommend certain vaccines, such as:  Influenza vaccine. This is recommended every year.  Tetanus, diphtheria, and acellular pertussis (Tdap, Td) vaccine. You may need a Td booster every 10 years.  Varicella vaccine. You may need this if you have not been vaccinated.  Zoster vaccine. You may need this after age 5.  Measles, mumps, and rubella (MMR) vaccine. You may need at least one dose of MMR if you were born in  1955 or later. You may also need a second dose.  Pneumococcal 13-valent conjugate (PCV13) vaccine. You may need this if you have certain conditions and were not previously vaccinated.  Pneumococcal polysaccharide (PPSV23) vaccine. You may need one or two doses if you smoke cigarettes or if you have certain conditions.  Meningococcal vaccine. You may need this if you have certain  conditions.  Hepatitis A vaccine. You may need this if you have certain conditions or if you travel or work in places where you may be exposed to hepatitis A.  Hepatitis B vaccine. You may need this if you have certain conditions or if you travel or work in places where you may be exposed to hepatitis B.  Haemophilus influenzae type b (Hib) vaccine. You may need this if you have certain conditions.  Talk to your health care provider about which screenings and vaccines you need and how often you need them. This information is not intended to replace advice given to you by your health care provider. Make sure you discuss any questions you have with your health care provider. Document Released: 02/15/2015 Document Revised: 10/09/2015 Document Reviewed: 11/20/2014 Elsevier Interactive Patient Education  [employer].

## 2017-12-08 LAB — LIPID PANEL
Cholesterol: 170 mg/dL (ref 0–200)
HDL: 51.8 mg/dL (ref >39.00)
LDL Cholesterol: 90 mg/dL (ref 0–99)
NonHDL: 117.85
TRIGLYCERIDES: 139 mg/dL (ref 0.0–149.0)
Total CHOL/HDL Ratio: 3
VLDL: 27.8 mg/dL (ref 0.0–40.0)

## 2017-12-08 LAB — COMPREHENSIVE METABOLIC PANEL
ALK PHOS: 41 U/L (ref 39–117)
ALT: 14 U/L (ref 0–35)
AST: 17 U/L (ref 0–37)
Albumin: 4.2 g/dL (ref 3.5–5.2)
BILIRUBIN TOTAL: 0.3 mg/dL (ref 0.2–1.2)
BUN: 15 mg/dL (ref 6–23)
CALCIUM: 9.3 mg/dL (ref 8.4–10.5)
CO2: 27 meq/L (ref 19–32)
Chloride: 102 mEq/L (ref 96–112)
Creatinine, Ser: 0.81 mg/dL (ref 0.40–1.20)
GFR: 100.68 mL/min (ref >60.00)
Glucose, Bld: 93 mg/dL (ref 70–99)
Potassium: 4 mEq/L (ref 3.5–5.1)
Sodium: 136 mEq/L (ref 135–145)
TOTAL PROTEIN: 6.7 g/dL (ref 6.0–8.3)

## 2017-12-08 LAB — CBC WITH DIFFERENTIAL/PLATELET
BASOS ABS: 0.1 10*3/uL (ref 0.0–0.1)
BASOS PCT: 0.8 % (ref 0.0–3.0)
EOS PCT: 1.6 % (ref 0.0–5.0)
Eosinophils Absolute: 0.1 10*3/uL (ref 0.0–0.7)
HCT: 39.9 % (ref 36.0–46.0)
Hemoglobin: 13.4 g/dL (ref 12.0–15.0)
LYMPHS ABS: 2.9 10*3/uL (ref 0.7–4.0)
LYMPHS PCT: 34.6 % (ref 12.0–46.0)
MCHC: 33.5 g/dL (ref 30.0–36.0)
MCV: 83.9 fl (ref 78.0–100.0)
MONOS PCT: 7.6 % (ref 3.0–12.0)
Monocytes Absolute: 0.6 10*3/uL (ref 0.1–1.0)
NEUTROS ABS: 4.7 10*3/uL (ref 1.4–7.7)
NEUTROS PCT: 55.4 % (ref 43.0–77.0)
PLATELETS: 175 10*3/uL (ref 150.0–400.0)
RBC: 4.76 Mil/uL (ref 3.87–5.11)
RDW: 13.8 % (ref 11.5–15.5)
WBC: 8.4 10*3/uL (ref 4.0–10.5)

## 2017-12-08 LAB — VITAMIN D 25 HYDROXY (VIT D DEFICIENCY, FRACTURES): VITD: 30.81 ng/mL (ref 30.00–100.00)

## 2017-12-08 LAB — TSH: TSH: 1.62 u[IU]/mL (ref 0.35–4.50)

## 2017-12-13 ENCOUNTER — Telehealth: Payer: Self-pay | Admitting: *Deleted

## 2017-12-13 NOTE — Telephone Encounter (Signed)
Patient requesting for recent lab results to be sent to MyChart.

## 2017-12-14 ENCOUNTER — Encounter: Payer: Self-pay | Admitting: *Deleted

## 2017-12-14 ENCOUNTER — Encounter: Payer: Self-pay | Admitting: Family Medicine

## 2017-12-14 ENCOUNTER — Other Ambulatory Visit: Payer: Self-pay | Admitting: Family Medicine

## 2017-12-14 DIAGNOSIS — E559 Vitamin D deficiency, unspecified: Secondary | ICD-10-CM

## 2017-12-14 MED ORDER — VITAMIN D (ERGOCALCIFEROL) 1.25 MG (50000 UNIT) PO CAPS: 50000.0000 [IU] | ORAL_CAPSULE | ORAL | 1 refills | Status: DC

## 2017-12-15 ENCOUNTER — Encounter: Payer: Self-pay | Admitting: Family Medicine

## 2017-12-16 NOTE — Telephone Encounter (Signed)
i'm not sure which she is talking about --- her labs were pretty much normal

## 2017-12-26 ENCOUNTER — Encounter: Payer: Self-pay | Admitting: Family Medicine

## 2017-12-28 ENCOUNTER — Ambulatory Visit: Payer: BLUE CROSS/BLUE SHIELD | Admitting: Family Medicine

## 2017-12-28 ENCOUNTER — Encounter: Payer: Self-pay | Admitting: Family Medicine

## 2017-12-28 VITALS — BP 128/78 | HR 84 | Ht 65.0 in | Wt 220.0 lb

## 2017-12-28 DIAGNOSIS — M79641 Pain in right hand: Secondary | ICD-10-CM

## 2017-12-28 DIAGNOSIS — M25562 Pain in left knee: Secondary | ICD-10-CM | POA: Diagnosis not present

## 2017-12-28 DIAGNOSIS — M25561 Pain in right knee: Secondary | ICD-10-CM | POA: Diagnosis not present

## 2017-12-28 DIAGNOSIS — G8929 Other chronic pain: Secondary | ICD-10-CM

## 2017-12-28 NOTE — Patient Instructions (Signed)
Your finger/hand pain is due to arthritis. These are the different medications you can take for this: Tylenol 500mg  1-2 tabs three times a day for pain. Capsaicin, aspercreme, or biofreeze topically up to four times a day may also help with pain. Some supplements that may help for arthritis: Boswellia extract, curcumin, pycnogenol Aleve 1-2 tabs twice a day with food as needed Cortisone injections are an option when specific joints are very painful. It's important that you continue to stay active. Heat or ice 15 minutes at a time 3-4 times a day as needed to help with pain. Follow up with me in 6 weeks.  Your knee pain is consistent with patellofemoral syndrome, possible superimposed mild arthritis. Avoid painful activities when possible (often deep squats, lunges bother this). Knee extensions, hamstring curls, straight leg raise, hip side raises, straight leg raises with foot turned outwards 3 sets of 10 once a day. Add ankle weight if these become too easy. Consider formal physical therapy. Avoid flat shoes, barefoot walking as much as possible. Icing 15 minutes at a time 3-4 times a day as needed. Tylenol and/or aleve as needed. Follow up with me in 6 weeks.

## 2017-12-29 ENCOUNTER — Encounter: Payer: Self-pay | Admitting: Family Medicine

## 2017-12-29 NOTE — Progress Notes (Signed)
PCP and consultation requested by: Donato Schultz, DO  Subjective:   HPI: Patient is a 42 y.o. female here for bilateral knee, right hand pain.  Patient reports she's had about a year of pain bilateral anterior knees. Pain currently 0/10 bilaterally. Pain with prolonged walking. No skin changes, catching, locking, giving out. Also with pain in her right hand more on radial side, base of thumb and index finger. Feels swollen but hasn't looked swollen. Sleeping in wrist brace the past 3 weeks but not had much benefit with this. Sometimes taking tylenol. Tingling into entire hand. Pain with gripping. Right handed.  Past Medical History:  Diagnosis Date  . HSV (herpes simplex virus) infection   . Postpartum care following vaginal delivery 10/05/2010    Current Outpatient Medications on File Prior to Visit  Medication Sig Dispense Refill  . valACYclovir (VALTREX) 500 MG tablet Take 500 mg by mouth daily.    . Vitamin D, Ergocalciferol, (DRISDOL) 1.25 MG (50000 UT) CAPS capsule Take 1 capsule (50,000 Units total) by mouth every 7 (seven) days. 12 capsule 1   No current facility-administered medications on file prior to visit.     Past Surgical History:  Procedure Laterality Date  . NO PAST SURGERIES      No Known Allergies  Social History   Socioeconomic History  . Marital status: Single    Spouse name: Not on file  . Number of children: Not on file  . Years of education: Not on file  . Highest education level: Not on file  Occupational History    Employer: [employer]  Social Needs  . Financial resource strain: Not on file  . Food insecurity:    Worry: Not on file    Inability: Not on file  . Transportation needs:    Medical: Not on file    Non-medical: Not on file  Tobacco Use  . Smoking status: Former Smoker    Years: 12.00  . Smokeless tobacco: Never Used  Substance and Sexual Activity  . Alcohol use: Yes    Comment: rare on ocassion  . Drug  use: No  . Sexual activity: Yes    Partners: Male  Lifestyle  . Physical activity:    Days per week: Not on file    Minutes per session: Not on file  . Stress: Not on file  Relationships  . Social connections:    Talks on phone: Not on file    Gets together: Not on file    Attends religious service: Not on file    Active member of club or organization: Not on file    Attends meetings of clubs or organizations: Not on file    Relationship status: Not on file  . Intimate partner violence:    Fear of current or ex partner: Not on file    Emotionally abused: Not on file    Physically abused: Not on file    Forced sexual activity: Not on file  Other Topics Concern  . Not on file  Social History Narrative  . Not on file    Family History  Problem Relation Age of Onset  . Lymphoma Mother   . Breast cancer Maternal Grandmother   . Diabetes Maternal Grandmother   . Diabetes Maternal Grandfather   . Cancer Maternal Grandfather        Unsure if Prostate or colon  . Diabetes Paternal Grandmother   . Diabetes Paternal Grandfather     BP 128/78  Pulse 84   Ht 5\' 5"  (1.651 m)   Wt 220 lb (99.8 kg)   BMI 36.61 kg/m   Review of Systems: See HPI above.     Objective:  Physical Exam:  Gen: NAD, comfortable in exam room  Right knee: No gross deformity, ecchymoses, swelling. No TTP. FROM with 5/5 strength flexion and extension. Negative ant/post drawers. Negative valgus/varus testing. Negative lachmans. Negative mcmurrays, apleys, patellar apprehension. NV intact distally.  Left knee: No gross deformity, ecchymoses, swelling. No TTP. FROM with 5/5 strength flexion and extension. Negative ant/post drawers. Negative valgus/varus testing. Negative lachmans. Negative mcmurrays, apleys, patellar apprehension. NV intact distally.  Right hand/wrist: No deformity, malrotation, angulation of digits. FROM with 5/5 strength flexion and extension at MCP, PIP, DIP joints 2nd  digit, finger abduction, extension, thumb opposition. TTP circumferentially about DIP 2nd digit.  No carpal tunnel, 1st CMC, 1st dorsal compartment tenderness. NVI distally. Negative tinels. Negative finkelsteins.  Assessment & Plan:  1. Bilateral knee pain - exam reassuring.  She describes patellofemoral syndrome, possible superimposed underlying arthritis.  Discussed home exercises to do daily.  Icing, tylenol and/or aleve.  Consider physical therapy.  Avoid flat shoes, barefoot walking.  F/u in 6 weeks.  2. Right hand pain - while she describes some symptoms of carpal tunnel, no evidence of this on exam.  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  Discussed tylenol, aleve, topical medications, supplements that may help.  Heat or ice.  F/u in 6 weeks.

## 2018-01-18 DIAGNOSIS — Z113 Encounter for screening for infections with a predominantly sexual mode of transmission: Secondary | ICD-10-CM | POA: Diagnosis not present

## 2018-01-18 DIAGNOSIS — Z1151 Encounter for screening for human papillomavirus (HPV): Secondary | ICD-10-CM | POA: Diagnosis not present

## 2018-01-18 DIAGNOSIS — Z1231 Encounter for screening mammogram for malignant neoplasm of breast: Secondary | ICD-10-CM | POA: Diagnosis not present

## 2018-01-18 DIAGNOSIS — N760 Acute vaginitis: Secondary | ICD-10-CM | POA: Diagnosis not present

## 2018-01-18 DIAGNOSIS — Z01419 Encounter for gynecological examination (general) (routine) without abnormal findings: Secondary | ICD-10-CM | POA: Diagnosis not present

## 2018-01-18 DIAGNOSIS — Z6837 Body mass index (BMI) 37.0-37.9, adult: Secondary | ICD-10-CM | POA: Diagnosis not present

## 2018-01-18 DIAGNOSIS — Z114 Encounter for screening for human immunodeficiency virus [HIV]: Secondary | ICD-10-CM | POA: Diagnosis not present

## 2018-01-18 DIAGNOSIS — Z1159 Encounter for screening for other viral diseases: Secondary | ICD-10-CM | POA: Diagnosis not present

## 2018-01-18 DIAGNOSIS — Z118 Encounter for screening for other infectious and parasitic diseases: Secondary | ICD-10-CM | POA: Diagnosis not present

## 2018-02-22 ENCOUNTER — Encounter: Payer: Self-pay | Admitting: Family Medicine

## 2018-02-22 ENCOUNTER — Other Ambulatory Visit: Payer: Self-pay | Admitting: Family Medicine

## 2018-02-22 DIAGNOSIS — M2550 Pain in unspecified joint: Secondary | ICD-10-CM

## 2018-02-22 NOTE — Telephone Encounter (Signed)
Referral in

## 2018-02-24 ENCOUNTER — Encounter (INDEPENDENT_AMBULATORY_CARE_PROVIDER_SITE_OTHER): Payer: BLUE CROSS/BLUE SHIELD

## 2018-03-01 ENCOUNTER — Encounter (INDEPENDENT_AMBULATORY_CARE_PROVIDER_SITE_OTHER): Payer: Self-pay | Admitting: Family Medicine

## 2018-03-01 ENCOUNTER — Ambulatory Visit (INDEPENDENT_AMBULATORY_CARE_PROVIDER_SITE_OTHER): Payer: BLUE CROSS/BLUE SHIELD | Admitting: Family Medicine

## 2018-03-01 VITALS — BP 105/69 | HR 62 | Temp 97.5°F | Ht 65.0 in | Wt 222.0 lb

## 2018-03-01 DIAGNOSIS — R0602 Shortness of breath: Secondary | ICD-10-CM

## 2018-03-01 DIAGNOSIS — F419 Anxiety disorder, unspecified: Secondary | ICD-10-CM | POA: Diagnosis not present

## 2018-03-01 DIAGNOSIS — Z6836 Body mass index (BMI) 36.0-36.9, adult: Secondary | ICD-10-CM

## 2018-03-01 DIAGNOSIS — E559 Vitamin D deficiency, unspecified: Secondary | ICD-10-CM

## 2018-03-01 DIAGNOSIS — Z1331 Encounter for screening for depression: Secondary | ICD-10-CM | POA: Diagnosis not present

## 2018-03-01 DIAGNOSIS — R5383 Other fatigue: Secondary | ICD-10-CM | POA: Diagnosis not present

## 2018-03-01 DIAGNOSIS — Z9189 Other specified personal risk factors, not elsewhere classified: Secondary | ICD-10-CM

## 2018-03-01 DIAGNOSIS — Z0289 Encounter for other administrative examinations: Secondary | ICD-10-CM

## 2018-03-02 ENCOUNTER — Encounter (INDEPENDENT_AMBULATORY_CARE_PROVIDER_SITE_OTHER): Payer: Self-pay | Admitting: Family Medicine

## 2018-03-02 LAB — CBC WITH DIFFERENTIAL
Basophils Absolute: 0 10*3/uL (ref 0.0–0.2)
Basos: 1 %
EOS (ABSOLUTE): 0.1 10*3/uL (ref 0.0–0.4)
Eos: 1 %
Hematocrit: 41.8 % (ref 34.0–46.6)
Hemoglobin: 13.4 g/dL (ref 11.1–15.9)
Immature Grans (Abs): 0 10*3/uL (ref 0.0–0.1)
Immature Granulocytes: 0 %
Lymphocytes Absolute: 2.3 10*3/uL (ref 0.7–3.1)
Lymphs: 30 %
MCH: 26.9 pg (ref 26.6–33.0)
MCHC: 32.1 g/dL (ref 31.5–35.7)
MCV: 84 fL (ref 79–97)
Monocytes Absolute: 0.4 10*3/uL (ref 0.1–0.9)
Monocytes: 5 %
NEUTROS PCT: 63 %
Neutrophils Absolute: 4.9 10*3/uL (ref 1.4–7.0)
RBC: 4.99 x10E6/uL (ref 3.77–5.28)
RDW: 13.2 % (ref 11.7–15.4)
WBC: 7.7 10*3/uL (ref 3.4–10.8)

## 2018-03-02 LAB — LIPID PANEL WITH LDL/HDL RATIO
Cholesterol, Total: 162 mg/dL (ref 100–199)
HDL: 47 mg/dL (ref >39)
LDL Calculated: 100 mg/dL — ABNORMAL HIGH (ref 0–99)
LDl/HDL Ratio: 2.1 ratio (ref 0.0–3.2)
Triglycerides: 76 mg/dL (ref 0–149)
VLDL Cholesterol Cal: 15 mg/dL (ref 5–40)

## 2018-03-02 LAB — COMPREHENSIVE METABOLIC PANEL
ALT: 23 IU/L (ref 0–32)
AST: 19 IU/L (ref 0–40)
Albumin/Globulin Ratio: 1.9 (ref 1.2–2.2)
Albumin: 4.5 g/dL (ref 3.8–4.8)
Alkaline Phosphatase: 47 IU/L (ref 39–117)
BILIRUBIN TOTAL: 0.3 mg/dL (ref 0.0–1.2)
BUN/Creatinine Ratio: 13 (ref 9–23)
BUN: 10 mg/dL (ref 6–24)
CHLORIDE: 103 mmol/L (ref 96–106)
CO2: 21 mmol/L (ref 20–29)
Calcium: 9.3 mg/dL (ref 8.7–10.2)
Creatinine, Ser: 0.77 mg/dL (ref 0.57–1.00)
GFR calc Af Amer: 112 mL/min/{1.73_m2} (ref >59)
GFR calc non Af Amer: 97 mL/min/{1.73_m2} (ref >59)
GLUCOSE: 91 mg/dL (ref 65–99)
Globulin, Total: 2.4 g/dL (ref 1.5–4.5)
Potassium: 4.7 mmol/L (ref 3.5–5.2)
Sodium: 138 mmol/L (ref 134–144)
Total Protein: 6.9 g/dL (ref 6.0–8.5)

## 2018-03-02 LAB — HEMOGLOBIN A1C
Est. average glucose Bld gHb Est-mCnc: 108 mg/dL
Hgb A1c MFr Bld: 5.4 % (ref 4.8–5.6)

## 2018-03-02 LAB — T4, FREE: Free T4: 1.09 ng/dL (ref 0.82–1.77)

## 2018-03-02 LAB — FOLATE: Folate: 17.8 ng/mL (ref >3.0)

## 2018-03-02 LAB — VITAMIN D 25 HYDROXY (VIT D DEFICIENCY, FRACTURES): VIT D 25 HYDROXY: 36.9 ng/mL (ref 30.0–100.0)

## 2018-03-02 LAB — T3: T3 TOTAL: 113 ng/dL (ref 71–180)

## 2018-03-02 LAB — VITAMIN B12: Vitamin B-12: 640 pg/mL (ref 232–1245)

## 2018-03-02 LAB — INSULIN, RANDOM: INSULIN: 18.8 u[IU]/mL (ref 2.6–24.9)

## 2018-03-02 LAB — TSH: TSH: 1.83 u[IU]/mL (ref 0.450–4.500)

## 2018-03-02 NOTE — Progress Notes (Signed)
Office: [phone]  /  Fax: [phone]   Dear Kathy [name] [name],   Thank you for referring Kathy [name] [name] to our clinic. The following note includes my evaluation and treatment recommendations.  HPI:   Chief Complaint: OBESITY    Kathy [name] [name] has been referred by Kathy [name] [name] for consultation regarding her obesity and obesity related comorbidities.    Kathy [name] [name] (MR# [id number]) is a 43 y.o. female who presents on 03/01/2018 for obesity evaluation and treatment. Current BMI is Body mass index is 36.94 kg/m.  Kathy [name] [name] has been struggling with her weight for many years and has been unsuccessful in either losing weight, maintaining weight loss, or reaching her healthy weight goal.     Kathy [name] [name] attended our information session and states she is currently in the action stage of change and ready to dedicate time achieving and maintaining a healthier weight. Kathy [name] [name] is interested in becoming our Kathy [name] [name] and working on intensive lifestyle modifications including (but not limited to) diet, exercise and weight loss.    Kathy [name] [name] states her family eats meals together she thinks her family will eat healthier with her her desired weight loss is 67 lbs she started gaining weight this year her heaviest weight ever was 223 lbs. she has significant food cravings issues  she snacks sometimes in the evenings she is frequently drinking liquids with calories she sometimes makes poor food choices she has binge eating behaviors she struggles with emotional eating    Kathy [name] [name] feels her energy is lower than it should be. This has worsened with weight gain and has not worsened recently. Kathy [name] [name] admits to daytime somnolence and admits to waking up still tired. Kathy [name] [name] is at risk for obstructive sleep apnea. Kathy [name] [name] has a history of symptoms of daytime Kathy [name] and morning headache. Kathy [name] [name] generally gets 6 or 7 hours of sleep per night, and states they generally have restless  sleep. Snoring is present. Apneic episodes are not present. Epworth Sleepiness Score is 1.  Dyspnea on exertion Kathy [name] [name] notes increasing shortness of breath with exercising and seems to be worsening over time with weight gain. She notes getting out of breath sooner with activity than she used to. This has not gotten worse recently. Kathy [name] [name] denies orthopnea.  Vitamin D deficiency Kathy [name] [name] has a diagnosis of vitamin D deficiency. She is currently taking prescription vit D, but is not at goal. She admits Kathy [name] and denies nausea, vomiting, or muscle weakness.  At risk for osteopenia and osteoporosis Kathy [name] [name] is at higher risk of osteopenia and osteoporosis due to vitamin D deficiency.   Anxiety  Kathy [name] [name] also has a elevated PHQ-9 score and thinking that she may have depression. Her anxiety causes palpitations and significant quality of life issues. She describes an unhealthy relationship with food as well as significant self hate and guilt about size. She is on Xanax.  Depression Screen Kathy [name] [name] Food and Mood (modified PHQ-9) score was  Depression screen PHQ 2/9 03/01/2018  Decreased Interest 1  Down, Depressed, Hopeless 2  PHQ - 2 Score 3  Altered sleeping 0  Tired, decreased energy 2  Change in appetite 3  Feeling bad or failure about yourself  1  Trouble concentrating 0  Moving slowly or fidgety/restless 0  Suicidal thoughts 0  PHQ-9 Score 9  Difficult doing work/chores Not difficult at all    ASSESSMENT AND PLAN:  Other Kathy [name] - Plan: EKG 12-Lead, Vitamin B12, CBC With Differential, Comprehensive metabolic panel, Folate, Hemoglobin A1c, Insulin, random, T3, T4, free,  TSH  Shortness of breath on exertion - Plan: Lipid Panel With LDL/HDL Ratio  Vitamin D deficiency - Plan: VITAMIN D 25 Hydroxy (Vit-D Deficiency, Fractures)  Anxiety  Depression screening  At risk for osteoporosis  Class 2 severe obesity with serious comorbidity and body mass index (BMI) of 36.0 to 36.9 in  adult, unspecified obesity type (HCC)  PLAN:  Kathy [name] Kathy [name] [name] was informed that her Kathy [name] may be related to obesity, depression or many other causes. Labs will be ordered, and in the meanwhile Kathy [name] [name] has agreed to work on diet, exercise and weight loss to help with Kathy [name]. Proper sleep hygiene was discussed including the need for 7-8 hours of quality sleep each night. A sleep study was not ordered based on symptoms and Epworth score. An EKG and an indirect calorimetry was ordered today and she agrees to follow up in 2 weeks.  Dyspnea on exertion Kathy [name] [name]'s shortness of breath appears to be obesity related and exercise induced. She has agreed to work on weight loss and gradually increase exercise to treat her exercise induced shortness of breath. If Kathy [name] [name] follows our instructions and loses weight without improvement of her shortness of breath, we will plan to refer to pulmonology. We will monitor this condition regularly. An indirect calorimetry, an EKG, and labs were ordered today. Kathy [name] [name] agrees to this plan.  Vitamin D Deficiency Kathy [name] [name] was informed that low vitamin D levels contributes to Kathy [name] and are associated with obesity, breast, and colon cancer.We will order a vitamin D level today and Kathy [name] agrees to follow up as directed.  At risk for osteopenia and osteoporosis Kathy [name] [name] was given extended (15 minutes) osteoporosis prevention counseling today. Kathy [name] [name] is at risk for osteopenia and osteoporosis due to her vitamin D deficiency. She was encouraged to take her vitamin D and follow her higher calcium diet and increase strengthening exercise to help strengthen her bones and decrease her risk of osteopenia and osteoporosis.  Anxiety  Kathy [name] [name] was referred to Kathy [name] [name], our bariatric psychologist for evaluation due to elevated PHQ-9 score and significant struggles with emotional eating. She will follow up with [place] in 2 weeks.  Depression Screen Kathy [name] [name] had a mildly positive depression  screening. Depression is commonly associated with obesity and often results in emotional eating behaviors. We will monitor this closely and work on CBT to help improve the non-hunger eating patterns. Referral to Psychology may be required if no improvement is seen as she continues in our clinic.  Obesity Anslei is currently in the action stage of change and her goal is to continue with weight loss efforts. I recommend Kathy [name] [name] begin the structured treatment plan as follows:  She has agreed to follow the Category 2 plan + 100 calories. Kathy [name] [name] has been instructed to eventually work up to a goal of 150 minutes of combined cardio and strengthening exercise per week for weight loss and overall health benefits. We discussed the following Behavioral Modification Strategies today: increasing lean protein intake, decreasing simple carbohydrates, and work on meal planning and easy cooking plans.   She was informed of the importance of frequent follow up visits to maximize her success with intensive lifestyle modifications for her multiple health conditions. She was informed we would discuss her lab results at her next visit unless there is a critical issue that needs to be addressed sooner. Kathy [name] [name] agreed to keep her next visit at the agreed upon time to discuss these results.  ALLERGIES: No Known Allergies  MEDICATIONS: Current Outpatient Medications on File Prior to Visit  Medication Sig Dispense Refill  . acetaminophen (TYLENOL) 650 MG CR tablet Take 650 mg by mouth every 8 (eight) hours as needed for pain.    Marland Kitchen. ALPRAZolam (XANAX) 0.5 MG tablet Take 0.5 mg by mouth 2 (two) times daily as needed for anxiety.    . Multiple Vitamins-Minerals (WOMENS ONE DAILY) TABS Take 1 tablet by mouth daily.    . valACYclovir (VALTREX) 500 MG tablet Take 500 mg by mouth daily as needed.     . Vitamin D, Ergocalciferol, (DRISDOL) 1.25 MG (50000 UT) CAPS capsule Take 1 capsule (50,000 Units total) by mouth every 7  (seven) days. 12 capsule 1   No current facility-administered medications on file prior to visit.     PAST MEDICAL HISTORY: Past Medical History:  Diagnosis Date  . Anxiety   . Arthritis   . Back pain   . Chest pain   . Dizziness   . Kathy [name]   . Generalized headaches   . Hand tingling   . Hip pain   . HLD (hyperlipidemia)   . HSV (herpes simplex virus) infection   . Knee pain   . Multiple food allergies    Avocado, Banana, [name], [name], sometimes apples:  Nausea, vomitting itching in throat  . Palpitations   . Postpartum care following vaginal delivery 10/05/2010    PAST SURGICAL HISTORY: Past Surgical History:  Procedure Laterality Date  . NO PAST SURGERIES      SOCIAL HISTORY: Social History   Tobacco Use  . Smoking status: Former Smoker    Packs/day: 1.00    Years: 10.00    Pack years: 10.00  . Smokeless tobacco: Never Used  Substance Use Topics  . Alcohol use: Yes    Comment: rare on ocassion  . Drug use: No    FAMILY HISTORY: Family History  Problem Relation Age of Onset  . Lymphoma Mother   . Breast cancer Maternal Grandmother   . Diabetes Maternal Grandmother   . Diabetes Maternal Grandfather   . Cancer Maternal Grandfather        Unsure if Prostate or colon  . Diabetes Paternal Grandmother   . Diabetes Paternal Grandfather   . Obesity Father     ROS: Review of Systems  Constitutional: Positive for malaise/Kathy [name]. Negative for weight loss.  HENT: Positive for tinnitus.        Positive for stuffiness.  Eyes:       Wears glasses or contacts.  Respiratory: Positive for cough and shortness of breath.   Cardiovascular: Positive for palpitations. Negative for orthopnea.  Gastrointestinal: Negative for nausea and vomiting.  Musculoskeletal: Positive for back pain, joint pain and myalgias.       Positive for breast lumps. Positive for calf pain with dancing or walking for long periods. Positive for muscle stiffness.  Neurological:  Positive for tingling and headaches.  Endo/Heme/Allergies: Bruises/bleeds easily.  Psychiatric/Behavioral: Positive for depression. The Kathy [name] [name] is nervous/anxious.        Positive for stress.    PHYSICAL EXAM: Blood pressure 105/69, pulse 62, temperature (!) 97.5 F (36.4 C), temperature source Oral, height 5\' 5"  (1.651 m), weight 222 lb (100.7 kg), last menstrual period 02/07/2018, SpO2 100 %. Body mass index is 36.94 kg/m. Physical Exam Vitals signs reviewed.  Constitutional:      Appearance: Normal appearance. She is obese.  HENT:     Head: Normocephalic and atraumatic.     Nose: Nose normal.  Eyes:     General: No scleral icterus.  Extraocular Movements: Extraocular movements intact.  Neck:     Musculoskeletal: Normal range of motion and neck supple.     Thyroid: No thyromegaly.     Comments: Negative for thyromegaly. Cardiovascular:     Rate and Rhythm: Normal rate and regular rhythm.  Pulmonary:     Effort: Pulmonary effort is normal. No respiratory distress.  Abdominal:     Palpations: Abdomen is soft.     Tenderness: There is no abdominal tenderness.     Comments: Positive for obesity.  Musculoskeletal:     Comments: ROM normal in all extremities.  Skin:    General: Skin is warm and dry.  Neurological:     Mental Status: She is alert and oriented to person, place, and time.     Coordination: Coordination normal.  Psychiatric:        Mood and Affect: Mood normal.        Behavior: Behavior normal.     RECENT LABS AND TESTS: BMET    Component Value Date/Time   NA 136 12/07/2017 1600   K 4.0 12/07/2017 1600   CL 102 12/07/2017 1600   CO2 27 12/07/2017 1600   GLUCOSE 93 12/07/2017 1600   BUN 15 12/07/2017 1600   CREATININE 0.81 12/07/2017 1600   CREATININE 0.78 11/02/2014 1540   CALCIUM 9.3 12/07/2017 1600   No results found for: HGBA1C No results found for: INSULIN CBC    Component Value Date/Time   WBC 8.4 12/07/2017 1600   RBC 4.76 12/07/2017  1600   HGB 13.4 12/07/2017 1600   HCT 39.9 12/07/2017 1600   PLT 175.0 12/07/2017 1600   MCV 83.9 12/07/2017 1600   MCH 27.0 11/02/2014 1540   MCHC 33.5 12/07/2017 1600   RDW 13.8 12/07/2017 1600   LYMPHSABS 2.9 12/07/2017 1600   MONOABS 0.6 12/07/2017 1600   EOSABS 0.1 12/07/2017 1600   BASOSABS 0.1 12/07/2017 1600   Iron/TIBC/Ferritin/ %Sat No results found for: IRON, TIBC, FERRITIN, IRONPCTSAT Lipid Panel     Component Value Date/Time   CHOL 170 12/07/2017 1600   TRIG 139.0 12/07/2017 1600   HDL 51.80 12/07/2017 1600   CHOLHDL 3 12/07/2017 1600   VLDL 27.8 12/07/2017 1600   LDLCALC 90 12/07/2017 1600   Hepatic Function Panel     Component Value Date/Time   PROT 6.7 12/07/2017 1600   ALBUMIN 4.2 12/07/2017 1600   AST 17 12/07/2017 1600   ALT 14 12/07/2017 1600   ALKPHOS 41 12/07/2017 1600   BILITOT 0.3 12/07/2017 1600      Component Value Date/Time   TSH 1.62 12/07/2017 1600   TSH 1.62 04/05/2017 1645   TSH 1.54 11/26/2016 1153   ECG  shows NSR with a rate of 73 BPM. INDIRECT CALORIMETER done today shows a VO2 of 182 and a REE of 1268.  Her calculated basal metabolic rate is [id number] thus her basal metabolic rate is worse than expected.  OBESITY BEHAVIORAL INTERVENTION VISIT  Today's visit was # 1   Starting weight: 222 lbs Starting date: 03/01/2018 Today's weight : Weight: 222 lb (100.7 kg)  Today's date: 03/01/2018 Total lbs lost to date: 0  ASK: We discussed the diagnosis of obesity with Kathy [name] NimJanelle N Linz today and Laurenashley agreed to give us permission to discuss obesity behavioral modification therapy today.  ASSESS: Karolee StampsJanelle has the diagnosis of obesity and her BMI today is 36.9. Karolee StampsJanelle is in the action stage of change.   ADVISE: Karolee StampsJanelle was educated on the multiple health risks  of obesity as well as the benefit of weight loss to improve her health. She was advised of the need for long term treatment and the importance of lifestyle modifications to improve  her current health and to decrease her risk of future health problems.  AGREE: Multiple dietary modification options and treatment options were discussed and Inanna agreed to follow the recommendations documented in the above note.  ARRANGE: Mishal was educated on the importance of frequent visits to treat obesity as outlined per CMS and USPSTF guidelines and agreed to schedule her next follow up appointment today.  I, Kirke Corin, am acting as transcriptionist for Wilder Glade, MD   I have reviewed the above documentation for accuracy and completeness, and I agree with the above. -Quillian Quince, MD

## 2018-03-10 NOTE — Progress Notes (Signed)
Office Visit Note  Patient: Kathy [name]             Date of Birth: 1976/01/13           MRN: [id number]             PCP: Ann Held, DO Referring: Ann Held, * Visit Date: 03/24/2018 Occupation: Assistant for financial advisor  Subjective:  Arthritis (Bil knee pain, low back pain, bil hip pain, right hand pain and tingling)   History of Present Illness: Kathy [name] is a 43 y.o. female seen in consultation per request of her PCP.  According to patient in October 2019 she started experiencing some pain in her hands.  She states at times she feels her hands are swollen.  She has been also experiencing intermittent paresthesias in her right hand at night.  She also has been experiencing lower back pain especially in the morning hours.  She states the pain could be severe at times.  She gives history of knee joint discomfort while walking or dancing.  She also has some hip pain while she participates in the [facility].  She has been experiencing feet pain as well.  She has been experiencing lower extremity muscle spasms and pain.  She was seen by her PCP and was referred to sports medicine doctor who did ultrasound per patient of her finger and was told that she has arthritis.  Activities of Daily Living:  Patient reports morning stiffness for 3 minutes.   Patient Denies nocturnal pain.  Difficulty dressing/grooming: Denies Difficulty climbing stairs: Reports Difficulty getting out of chair: Denies Difficulty using hands for taps, buttons, cutlery, and/or writing: Reports  Review of Systems  Constitutional: Positive for fatigue. Negative for night sweats, weight gain and weight loss.  HENT: Negative for mouth sores, trouble swallowing, trouble swallowing, mouth dryness and nose dryness.   Eyes: Negative for pain, redness, visual disturbance and dryness.  Respiratory: Negative for cough, shortness of breath and difficulty breathing.   Cardiovascular: Negative  for chest pain, palpitations, hypertension, irregular heartbeat and swelling in legs/feet.  Gastrointestinal: Negative for blood in stool, constipation and diarrhea.  Endocrine: Negative for excessive thirst and increased urination.  Genitourinary: Negative for difficulty urinating and vaginal dryness.  Musculoskeletal: Positive for arthralgias, joint pain, joint swelling, myalgias, morning stiffness and myalgias. Negative for muscle weakness and muscle tenderness.  Skin: Negative for color change, rash, hair loss, skin tightness, ulcers and sensitivity to sunlight.  Allergic/Immunologic: Negative for susceptible to infections.  Neurological: Positive for dizziness and parasthesias. Negative for numbness, memory loss, night sweats and weakness.  Hematological: Negative for bruising/bleeding tendency and swollen glands.  Psychiatric/Behavioral: Negative for depressed mood and sleep disturbance. The patient is nervous/anxious.     PMFS History:  Patient Active Problem List   Diagnosis Date Noted  . Vitamin D deficiency 03/24/2018  . Family history of systemic lupus erythematosus 03/24/2018  . HSV (herpes simplex virus) infection 03/24/2018  . History of anxiety 03/24/2018  . History of hyperlipidemia 03/24/2018  . Abnormal smell 02/11/2017  . Chest pain 02/11/2017  . Biological false positive RPR test 11/26/2016    Past Medical History:  Diagnosis Date  . Anxiety   . Arthritis   . Back pain   . Chest pain   . Dizziness   . Fatigue   . Generalized headaches   . Hand tingling   . Hip pain   . HLD (hyperlipidemia)   . HSV (herpes  simplex virus) infection   . Knee pain   . Multiple food allergies    Avocado, Banana, [name], [name], sometimes apples:  Nausea, vomitting itching in throat  . Palpitations   . Postpartum care following vaginal delivery 10/05/2010    Family History  Problem Relation Age of Onset  . Lymphoma Mother   . Breast cancer Maternal Grandmother   .  Diabetes Maternal Grandmother   . Diabetes Maternal Grandfather   . Cancer Maternal Grandfather        Unsure if Prostate or colon  . Diabetes Paternal Grandmother   . Diabetes Paternal Grandfather   . Obesity Father    Past Surgical History:  Procedure Laterality Date  . NO PAST SURGERIES     Social History   Social History Narrative  . Not on file   Immunization History  Administered Date(s) Administered  . Influenza Inj Mdck Quad Pf 11/10/2017  . Influenza,inj,Quad PF,6+ Mos 11/26/2015, 11/26/2016  . Tdap 10/06/2010     Objective: Vital Signs: BP (!) 101/58 (BP Location: Right Arm, Patient Position: Sitting, Cuff Size: Normal)   Pulse 70   Resp 14   Ht '5\' 5"'$  (1.651 m)   Wt 221 lb (100.2 kg)   LMP 03/07/2018 (Exact Date)   BMI 36.78 kg/m    Physical Exam Vitals signs and nursing note reviewed.  Constitutional:      Appearance: She is well-developed.  HENT:     Head: Normocephalic and atraumatic.  Eyes:     Conjunctiva/sclera: Conjunctivae normal.  Neck:     Musculoskeletal: Normal range of motion.  Cardiovascular:     Rate and Rhythm: Normal rate and regular rhythm.     Heart sounds: Normal heart sounds.  Pulmonary:     Effort: Pulmonary effort is normal.     Breath sounds: Normal breath sounds.  Abdominal:     General: Bowel sounds are normal.     Palpations: Abdomen is soft.  Lymphadenopathy:     Cervical: No cervical adenopathy.  Skin:    General: Skin is warm and dry.     Capillary Refill: Capillary refill takes less than 2 seconds.  Neurological:     Mental Status: She is alert and oriented to person, place, and time.  Psychiatric:        Behavior: Behavior normal.      Musculoskeletal Exam: C-spine thoracic lumbar spine good range of motion.  He had discomfort in the lower lumbar region and over SI joints.  Shoulder joints elbow joints wrist joint MCPs PIPs DIPs been good range of motion with no synovitis.  Hip joints knee joints ankles MTPs  PIPs with good range of motion with no synovitis.  She did have hypermobility in most of her joints.  CDAI Exam: CDAI Score: Not documented Patient Global Assessment: Not documented; Provider Global Assessment: Not documented Swollen: Not documented; Tender: Not documented Joint Exam   Not documented   There is currently no information documented on the homunculus. Go to the Rheumatology activity and complete the homunculus joint exam.  Investigation: No additional findings.  Imaging: Xr Foot 2 Views Left  Result Date: 03/24/2018 No MTP, PIP or DIP narrowing was noted.  No intertarsal joint space narrowing was noted. Impression: Unremarkable x-ray of the foot.  Xr Foot 2 Views Right  Result Date: 03/24/2018 No MTP, PIP or DIP narrowing was noted.  No intertarsal joint space narrowing was noted. Impression: Unremarkable x-ray of the foot.  Xr Hand 2 View Left  Result Date: 03/24/2018 DIP spurring was noted.  No PIP MCP intercarpal or radiocarpal joint space narrowing was noted.  No erosive changes were noted. 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  Xr Hand 2 View Right  Result Date: 03/24/2018 DIP spurring was noted.  No PIP MCP intercarpal or radiocarpal joint space narrowing was noted.  No erosive changes were noted. 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  Xr Knee 3 View Left  Result Date: 03/24/2018 Mild medial compartment narrowing was noted.  Mild patellofemoral narrowing was noted.  No chondrocalcinosis was noted. 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  Xr Knee 3 View Right  Result Date: 03/24/2018 No medial lateral compartment narrowing was noted.  Mild patellofemoral narrowing was noted.  No chondrocalcinosis was noted. 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  Xr Lumbar Spine 2-3 Views  Result Date: 03/24/2018 No disc space narrowing was noted.   No facet joint arthropathy was noted. Impression: Unremarkable x-ray of the lumbar spine.  Xr Pelvis 1-2 Views  Result Date: 03/24/2018 No SI joint narrowing or sclerosis was noted.  No hip joint narrowing was noted. Impression: Unremarkable x-ray of the SI joints and hip joints.   Recent Labs: Lab Results  Component Value Date   WBC 7.7 03/01/2018   HGB 13.4 03/01/2018   PLT 175.0 12/07/2017   NA 138 03/01/2018   K 4.7 03/01/2018   CL 103 03/01/2018   CO2 21 03/01/2018   GLUCOSE 91 03/01/2018   BUN 10 03/01/2018   CREATININE 0.77 03/01/2018   BILITOT 0.3 03/01/2018   ALKPHOS 47 03/01/2018   AST 19 03/01/2018   ALT 23 03/01/2018   PROT 6.9 03/01/2018   ALBUMIN 4.5 03/01/2018   CALCIUM 9.3 03/01/2018   GFRAA 112 03/01/2018    Speciality Comments: No specialty comments available.  Procedures:  No procedures performed Allergies: Patient has no known allergies.   Assessment / Plan:     Visit Diagnoses: Pain in both hands -patient gives history of intermittent swelling in her hands.  No warmth swelling or effusion was noted.  No synovitis was noted.  X-ray of bilateral hands were unremarkable except for mild DIP spurring.  Plan: XR Hand 2 View Right, XR Hand 2 View Left  Chronic pain of both knees -she complains of bilateral knee joint discomfort.  The x-rays showed mild chondromalacia patella.  Plan: XR KNEE 3 VIEW RIGHT, XR KNEE 3 VIEW LEFT, Rheumatoid factor, Sedimentation rate, HLA-B27 antigen, ANA, Cyclic citrul peptide antibody, IgG, 14-3-3 eta Protein, Uric acid.  A handout on knee exercises was given.  Chronic SI joint pain -she had tenderness over SI joints.  She also complains of morning stiffness.  X-ray of the pelvis was unremarkable.  Plan: XR Pelvis 1-2 Views  Chronic midline low back pain without sciatica -she has lower back pain.  She has good flexibility.  The x-ray of the lumbar spine was unremarkable.  Plan: XR Lumbar Spine 2-3 Views.  A handout on back  exercises was given.  Family history of systemic lupus erythematosus - First cousin  Myalgia - Plan: CK  Pain in both feet -no warmth swelling or effusion was noted in the feet.  X-ray of bilateral feet were unremarkable.  Plan: XR Foot 2 Views Left, XR Foot 2 Views Right.  She has pes planus on examination.  Proper fitting shoes were discussed.  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  I believe  a lot of discomfort could be coming from her underlying hypermobility.  History of hyperlipidemia  Other fatigue  History of anxiety  Biological false positive RPR test  HSV (herpes simplex virus) infection  History of palpitations  Vitamin D deficiency    Orders: Orders Placed This Encounter  Procedures  . XR KNEE 3 VIEW RIGHT  . XR KNEE 3 VIEW LEFT  . XR Hand 2 View Right  . XR Hand 2 View Left  . XR Lumbar Spine 2-3 Views  . XR Pelvis 1-2 Views  . XR Foot 2 Views Left  . XR Foot 2 Views Right  . CK  . Rheumatoid factor  . Sedimentation rate  . HLA-B27 antigen  . ANA  . Cyclic citrul peptide antibody, IgG  . 14-3-3 eta Protein  . Uric acid   No orders of the defined types were placed in this encounter.   Face-to-face time spent with patient was 50 minutes. Greater than 50% of time was spent in counseling and coordination of care.  Follow-Up Instructions: Return for polyarthralgia.   Bo Merino, MD  Note - This record has been created using Editor, commissioning.  Chart creation errors have been sought, but may not always  have been located. Such creation errors do not reflect on  the standard of medical care.

## 2018-03-12 ENCOUNTER — Other Ambulatory Visit: Payer: Self-pay | Admitting: Family Medicine

## 2018-03-12 DIAGNOSIS — E559 Vitamin D deficiency, unspecified: Secondary | ICD-10-CM

## 2018-03-13 ENCOUNTER — Other Ambulatory Visit: Payer: Self-pay | Admitting: Family Medicine

## 2018-03-13 DIAGNOSIS — E559 Vitamin D deficiency, unspecified: Secondary | ICD-10-CM

## 2018-03-15 ENCOUNTER — Encounter (INDEPENDENT_AMBULATORY_CARE_PROVIDER_SITE_OTHER): Payer: Self-pay | Admitting: Family Medicine

## 2018-03-15 ENCOUNTER — Ambulatory Visit (INDEPENDENT_AMBULATORY_CARE_PROVIDER_SITE_OTHER): Payer: BLUE CROSS/BLUE SHIELD | Admitting: Family Medicine

## 2018-03-15 VITALS — BP 105/68 | HR 71 | Temp 98.0°F | Ht 65.0 in | Wt 217.0 lb

## 2018-03-15 DIAGNOSIS — Z6836 Body mass index (BMI) 36.0-36.9, adult: Secondary | ICD-10-CM | POA: Diagnosis not present

## 2018-03-15 DIAGNOSIS — E8881 Metabolic syndrome: Secondary | ICD-10-CM

## 2018-03-15 DIAGNOSIS — E559 Vitamin D deficiency, unspecified: Secondary | ICD-10-CM

## 2018-03-15 DIAGNOSIS — Z9189 Other specified personal risk factors, not elsewhere classified: Secondary | ICD-10-CM

## 2018-03-15 DIAGNOSIS — E88819 Insulin resistance, unspecified: Secondary | ICD-10-CM

## 2018-03-15 DIAGNOSIS — E66812 Obesity, class 2: Secondary | ICD-10-CM

## 2018-03-15 MED ORDER — VITAMIN D (ERGOCALCIFEROL) 1.25 MG (50000 UNIT) PO CAPS: 50000.0000 [IU] | ORAL_CAPSULE | ORAL | 0 refills | Status: DC

## 2018-03-16 ENCOUNTER — Encounter: Payer: Self-pay | Admitting: Family Medicine

## 2018-03-16 NOTE — Progress Notes (Signed)
Office: [phone]  /  Fax: [phone]   HPI:   Chief Complaint: OBESITY Kathy [name] is here to discuss her progress with her obesity treatment plan. She is on the Category 2 plan +100 calories and is following her eating plan approximately 90 % of the time. She states she is doing boot camp, dance 60 minutes 4 times per week. Kathy [name] did well with weight loss on her Category 2 plan. Hunger was mostly controlled but she especially struggles on the weekends. Her weight is 217 lb (98.4 kg) today and has had a weight loss of 5 pounds over a period of 2 weeks since her last visit. She has lost 5 lbs since starting treatment with [place].  Vitamin D deficiency Kathy [name] has a diagnosis of vitamin D deficiency. She is currently taking prescription Vit D and denies nausea, vomiting or muscle weakness. She is not yet at goal. Kathy [name] reports fatigue.  Insulin Resistance Kathy [name] has a new diagnosis of insulin resistance based on her elevated fasting insulin level >5. Although Kathy [name] blood glucose readings are normal and under good control, insulin resistance puts her at greater risk of metabolic syndrome and diabetes. She is not taking metformin currently and continues to work on diet and exercise to decrease risk of diabetes. She reports polyphagia with diet prescription.   At risk for diabetes Kathy [name] is at higher than average risk for developing diabetes due to her obesity. She currently denies polyuria or polydipsia.  ASSESSMENT AND PLAN:  Vitamin D deficiency - Plan: Vitamin D, Ergocalciferol, (DRISDOL) 1.25 MG (50000 UT) CAPS capsule  Insulin resistance  At risk for diabetes mellitus  Class 2 severe obesity with serious comorbidity and body mass index (BMI) of 36.0 to 36.9 in adult, unspecified obesity type (HCC)  PLAN:  Vitamin D Deficiency Kathy [name] was informed that low vitamin D levels contributes to fatigue and are associated with obesity, breast, and colon cancer. She agrees to  continue to take prescription Vit D 50,000 IU every week #4 with no refills and will follow up for routine testing of vitamin D, at least 2-3 times per year. She was informed of the risk of over-replacement of vitamin D and agrees to not increase her dose unless she discusses this with [place] first. Kathy [name] agrees to follow up with our clinic in 2 weeks.  Insulin Resistance Kathy [name] will continue to work on weight loss, exercise, and decreasing simple carbohydrates in her diet to help decrease the risk of diabetes. We dicussed metformin including benefits and risks. She was informed that eating too many simple carbohydrates or too many calories at one sitting increases the likelihood of GI side effects. Kathy [name] declined metformin for now and prescription was not written today. Kathy [name] agrees to continue with diet and exercise and follow up with our clinic in 2 weeks.  Diabetes risk counseling Kathy [name] was given extended (15 minutes) diabetes prevention counseling today. She is 43 y.o. female and has risk factors for diabetes including obesity. We discussed intensive lifestyle modifications today with an emphasis on weight loss as well as increasing exercise and decreasing simple carbohydrates in her diet.  Obesity Kathy [name] is currently in the action stage of change. As such, her goal is to continue with weight loss efforts She has agreed to follow the Category 2 plan +100 calories Kathy [name] has been instructed to work up to a goal of 150 minutes of combined cardio and strengthening exercise per week for weight loss and overall health benefits. We discussed the following Behavioral Modification  Strategies today: increasing lean protein intake and decreasing simple carbohydrates   Kathy [name] has agreed to follow up with our clinic in 2 weeks. She was informed of the importance of frequent follow up visits to maximize her success with intensive lifestyle modifications for her multiple health  conditions.  ALLERGIES: No Known Allergies  MEDICATIONS: Current Outpatient Medications on File Prior to Visit  Medication Sig Dispense Refill  . acetaminophen (TYLENOL) 650 MG CR tablet Take 650 mg by mouth every 8 (eight) hours as needed for pain.    Marland Kitchen. ALPRAZolam (XANAX) 0.5 MG tablet Take 0.5 mg by mouth 2 (two) times daily as needed for anxiety.    . Multiple Vitamins-Minerals (WOMENS ONE DAILY) TABS Take 1 tablet by mouth daily.    . valACYclovir (VALTREX) 500 MG tablet Take 500 mg by mouth daily as needed.      No current facility-administered medications on file prior to visit.     PAST MEDICAL HISTORY: Past Medical History:  Diagnosis Date  . Anxiety   . Arthritis   . Back pain   . Chest pain   . Dizziness   . Fatigue   . Generalized headaches   . Hand tingling   . Hip pain   . HLD (hyperlipidemia)   . HSV (herpes simplex virus) infection   . Knee pain   . Multiple food allergies    Avocado, Banana, [name], [name], sometimes apples:  Nausea, vomitting itching in throat  . Palpitations   . Postpartum care following vaginal delivery 10/05/2010    PAST SURGICAL HISTORY: Past Surgical History:  Procedure Laterality Date  . NO PAST SURGERIES      SOCIAL HISTORY: Social History   Tobacco Use  . Smoking status: Former Smoker    Packs/day: 1.00    Years: 10.00    Pack years: 10.00  . Smokeless tobacco: Never Used  Substance Use Topics  . Alcohol use: Yes    Comment: rare on ocassion  . Drug use: No    FAMILY HISTORY: Family History  Problem Relation Age of Onset  . Lymphoma Mother   . Breast cancer Maternal Grandmother   . Diabetes Maternal Grandmother   . Diabetes Maternal Grandfather   . Cancer Maternal Grandfather        Unsure if Prostate or colon  . Diabetes Paternal Grandmother   . Diabetes Paternal Grandfather   . Obesity Father     ROS: Review of Systems  Constitutional: Positive for weight loss.  Gastrointestinal: Negative for  nausea and vomiting.  Genitourinary:       Negative for polyuria  Musculoskeletal:       Negative for muscle weakness  Endo/Heme/Allergies: Negative for polydipsia.       Negative for polyphagia    PHYSICAL EXAM: Blood pressure 105/68, pulse 71, temperature 98 F (36.7 C), temperature source Oral, height 5\' 5"  (1.651 m), weight 217 lb (98.4 kg), last menstrual period 03/07/2018, SpO2 98 %. Body mass index is 36.11 kg/m. Physical Exam Vitals signs reviewed.  Constitutional:      Appearance: Normal appearance. She is obese.  Cardiovascular:     Rate and Rhythm: Normal rate.     Pulses: Normal pulses.  Pulmonary:     Effort: Pulmonary effort is normal.  Musculoskeletal: Normal range of motion.  Skin:    General: Skin is warm and dry.  Neurological:     Mental Status: She is alert and oriented to person, place, and time.  Psychiatric:  Mood and Affect: Mood normal.        Behavior: Behavior normal.     RECENT LABS AND TESTS: BMET    Component Value Date/Time   NA 138 03/01/2018 1120   K 4.7 03/01/2018 1120   CL 103 03/01/2018 1120   CO2 21 03/01/2018 1120   GLUCOSE 91 03/01/2018 1120   GLUCOSE 93 12/07/2017 1600   BUN 10 03/01/2018 1120   CREATININE 0.77 03/01/2018 1120   CREATININE 0.78 11/02/2014 1540   CALCIUM 9.3 03/01/2018 1120   GFRNONAA 97 03/01/2018 1120   GFRAA 112 03/01/2018 1120   Lab Results  Component Value Date   HGBA1C 5.4 03/01/2018   Lab Results  Component Value Date   INSULIN 18.8 03/01/2018   CBC    Component Value Date/Time   WBC 7.7 03/01/2018 1120   WBC 8.4 12/07/2017 1600   RBC 4.99 03/01/2018 1120   RBC 4.76 12/07/2017 1600   HGB 13.4 03/01/2018 1120   HCT 41.8 03/01/2018 1120   PLT 175.0 12/07/2017 1600   MCV 84 03/01/2018 1120   MCH 26.9 03/01/2018 1120   MCH 27.0 11/02/2014 1540   MCHC 32.1 03/01/2018 1120   MCHC 33.5 12/07/2017 1600   RDW 13.2 03/01/2018 1120   LYMPHSABS 2.3 03/01/2018 1120   MONOABS 0.6  12/07/2017 1600   EOSABS 0.1 03/01/2018 1120   BASOSABS 0.0 03/01/2018 1120   Iron/TIBC/Ferritin/ %Sat No results found for: IRON, TIBC, FERRITIN, IRONPCTSAT Lipid Panel     Component Value Date/Time   CHOL 162 03/01/2018 1120   TRIG 76 03/01/2018 1120   HDL 47 03/01/2018 1120   CHOLHDL 3 12/07/2017 1600   VLDL 27.8 12/07/2017 1600   LDLCALC 100 (H) 03/01/2018 1120   Hepatic Function Panel     Component Value Date/Time   PROT 6.9 03/01/2018 1120   ALBUMIN 4.5 03/01/2018 1120   AST 19 03/01/2018 1120   ALT 23 03/01/2018 1120   ALKPHOS 47 03/01/2018 1120   BILITOT 0.3 03/01/2018 1120      Component Value Date/Time   TSH 1.830 03/01/2018 1120   TSH 1.62 12/07/2017 1600   TSH 1.62 04/05/2017 1645    Ref. Range 03/01/2018 11:20  Vitamin D, 25-Hydroxy Latest Ref Range: 30.0 - 100.0 ng/mL 36.9     OBESITY BEHAVIORAL INTERVENTION VISIT  Today's visit was # 2   Starting weight: 222 lbs Starting date: 03/01/2018 Today's weight :: 217 lbs Today's date: 03/15/2018 Total lbs lost to date: 5   ASK: We discussed the diagnosis of obesity with Kathy [name] today and Kathy [name] agreed to give [place] permission to discuss obesity behavioral modification therapy today.  ASSESS: Emmalise has the diagnosis of obesity and her BMI today is 36.11 Karlye is in the action stage of change   ADVISE: Kathy [name] was educated on the multiple health risks of obesity as well as the benefit of weight loss to improve her health. She was advised of the need for long term treatment and the importance of lifestyle modifications to improve her current health and to decrease her risk of future health problems.  AGREE: Multiple dietary modification options and treatment options were discussed and  Rayvn agreed to follow the recommendations documented in the above note.  ARRANGE: Kathy [name] was educated on the importance of frequent visits to treat obesity as outlined per CMS and USPSTF guidelines and  agreed to schedule her next follow up appointment today.  I, Tammy Wysor, am acting as Energy manager for [employer]  MD  I have reviewed the above documentation for accuracy and completeness, and I agree with the above. -Dennard Nip, MD

## 2018-03-17 NOTE — Progress Notes (Unsigned)
Office: [phone]  /  Fax: [phone]    Date: March 21, 2018  Time Seen: *** Duration: *** Provider: Lawerance Cruel, PsyD Type of Session: Intake for Individual Therapy  Type of Contact: Face-to-face  Informed Consent:The provider's role was explained to [name]. The provider reviewed and discussed issues of confidentiality, privacy, and limits therein. In addition to verbal informed consent, written informed consent for psychological services was obtained from [facility] prior to the initial intake interview. Written consent included information concerning the practice, financial arrangements, and confidentiality and patients' rights. Since the clinic is not a 24/7 crisis center, mental health emergency resources were shared in the form of a handout, and the provider explained MyChart, e-mail, voicemail, and/or other messaging systems should be utilized only for non-emergency reasons. Jinelle verbally acknowledged understanding of the aforementioned, and agreed to use mental health emergency resources discussed if needed. Moreover, Caasi agreed information may be shared with other [name] providers as needed for coordination of care, and written consent was obtained.   Chief Complaint: Raichel was referred by Dr. Quillian Quince due to anxiety. Per the note for the visit with Dr. Quillian Quince on March 01, 2018, "Jianni also has a elevated PHQ-9 score and thinking that she may have depression. Her anxiety causes palpitations and significant quality of life issues. She describes an unhealthy relationship with food as well as significant self hate and guilt about size. She is on Xanax."  Hermina was asked to complete a questionnaire assessing various behaviors related to emotional eating. Kamry endorsed the following: {gbmoodandfood:21755}.  HPI: Per the note for the initial visit with Dr. Quillian Quince on March 01, 2018, Shiloe reported experiencing  the following: significant food cravings issues , snacking frequently in the evenings, frequently drinking liquids with calories, frequently making poor food choices, binge eating behaviors and struggling with emotional eating.   Mental Status Examination: Cari arrived on time for the appointment. She presented as appropriately dressed and groomed. Tylasia appeared her stated age and demonstrated adequate orientation to time, place, person, and purpose of the appointment. She also demonstrated appropriate eye contact. No psychomotor abnormalities or behavioral peculiarities noted. Her mood was {gbmood:21757} with congruent affect. Her thought processes were logical, linear, and goal-directed. No hallucinations, delusions, bizarre thinking or behavior reported or observed. Judgment, insight, and impulse control appeared to be grossly intact. There was no evidence of paraphasias (i.e., errors in speech, gross mispronunciations, and word substitutions), repetition deficits, or disturbances in volume or prosody (i.e., rhythm and intonation). There was no evidence of attention or memory impairments. Elisea denied current suicidal and homicidal ideation, plan, and intent.   The Mini-Mental State Examination, Second Edition (MMSE-2) was administered. The MMSE-2 briefly screens for cognitive dysfunction and overall mental status and assesses different cognitive domains: orientation, registration, attention and calculation, recall, and language and praxis. Jrue received *** out of 30 points possible on the MMSE-2, which is noted in the *** range.   Family & Psychosocial History: ***  Medical History: ***  Mental Health History: ***  Kielyn denied a trauma history, including {gbtrauma:22071} abuse, as well as neglect. ***  Quintana reported experiencing the following: {gbintakesxs:21966}  Tameko reported experiencing worry thoughts regarding the following:   Eri denied experiencing the  following: {gbsxs:21965}  She also denied history of and current suicidal ideation, plan, and intent; history of and current homicidal ideation, plan, and intent; and history of and current engagement in self-harm.  The following strengths were reported by  Teagyn:*** The following strengths were observed by this provider: {gbstrengths:22223}.  Legal History: ***  Structured Assessment Results: The Patient Health Questionnaire-9 (PHQ-9) is a self-report measure that assesses symptoms and severity of depression over the course of the last two weeks. Arlanda obtained a score of *** suggesting {GBPHQ9SEVERITY:21752}. Itali finds the endorsed symptoms to be {gbphq9difficulty:21754}.    The Generalized Anxiety Disorder-7 (GAD-7) is a brief self-report measure that assesses symptoms of anxiety over the course of the last two weeks. Corianna obtained a score of *** suggesting {gbgad7severity:21753}.  Interventions: A chart review was conducted prior to the clinical intake interview. The MMSE-2***, PHQ-9, and GAD-7 were administered and a clinical intake interview was completed. In addition, Natividad was asked to complete a Mood and Food questionnaire to assess various behaviors related to emotional eating. Throughout session, empathic reflections and validation was provided. Continuing treatment with this provider was discussed and a treatment goal was established. Psychoeducation regarding emotional versus physical hunger was provided. Autrey was given a handout to utilize between now and the next appointment to increase awareness of hunger patterns and subsequent eating. ***  Provisional DSM-5 Diagnosis: ***  Plan: Patric appears able and willing to participate as evidenced by collaboration on a treatment goal, engagement in reciprocal conversation, and asking questions as needed for clarification. The next appointment will be scheduled in {gbweeks:21758}. The following treatment goal was established:  {gbtxgoals:21759}. For the aforementioned goal, Imagine can benefit from biweekly individual therapy sessions that are brief in duration for approximately four to six sessions. The treatment modality will be individual therapeutic services, including an eclectic therapeutic approach utilizing techniques from Cognitive Behavioral Therapy, Patient Centered Therapy, Dialectical Behavior Therapy, Acceptance and Commitment Therapy, Interpersonal Therapy, and Cognitive Restructuring. Therapeutic approach will include various interventions as appropriate, such as validation, support, mindfulness, thought defusion, reframing, psychoeducation, values assessment, and role playing. This provider will regularly review the treatment plan and medical chart to keep informed of status changes. Tanajah expressed understanding and agreement with the initial treatment plan of care.

## 2018-03-21 ENCOUNTER — Ambulatory Visit (INDEPENDENT_AMBULATORY_CARE_PROVIDER_SITE_OTHER): Payer: BLUE CROSS/BLUE SHIELD | Admitting: Psychology

## 2018-03-24 ENCOUNTER — Ambulatory Visit: Payer: BLUE CROSS/BLUE SHIELD | Admitting: Rheumatology

## 2018-03-24 ENCOUNTER — Ambulatory Visit (INDEPENDENT_AMBULATORY_CARE_PROVIDER_SITE_OTHER): Payer: Self-pay

## 2018-03-24 ENCOUNTER — Encounter: Payer: Self-pay | Admitting: Rheumatology

## 2018-03-24 VITALS — BP 101/58 | HR 70 | Resp 14 | Ht 65.0 in | Wt 221.0 lb

## 2018-03-24 DIAGNOSIS — M79642 Pain in left hand: Secondary | ICD-10-CM

## 2018-03-24 DIAGNOSIS — Z87898 Personal history of other specified conditions: Secondary | ICD-10-CM

## 2018-03-24 DIAGNOSIS — M79641 Pain in right hand: Secondary | ICD-10-CM | POA: Diagnosis not present

## 2018-03-24 DIAGNOSIS — Z8269 Family history of other diseases of the musculoskeletal system and connective tissue: Secondary | ICD-10-CM

## 2018-03-24 DIAGNOSIS — R5383 Other fatigue: Secondary | ICD-10-CM

## 2018-03-24 DIAGNOSIS — M545 Low back pain: Secondary | ICD-10-CM

## 2018-03-24 DIAGNOSIS — M25561 Pain in right knee: Secondary | ICD-10-CM | POA: Diagnosis not present

## 2018-03-24 DIAGNOSIS — M79672 Pain in left foot: Secondary | ICD-10-CM

## 2018-03-24 DIAGNOSIS — G8929 Other chronic pain: Secondary | ICD-10-CM

## 2018-03-24 DIAGNOSIS — M791 Myalgia, unspecified site: Secondary | ICD-10-CM | POA: Diagnosis not present

## 2018-03-24 DIAGNOSIS — M533 Sacrococcygeal disorders, not elsewhere classified: Secondary | ICD-10-CM

## 2018-03-24 DIAGNOSIS — E7800 Pure hypercholesterolemia, unspecified: Secondary | ICD-10-CM | POA: Insufficient documentation

## 2018-03-24 DIAGNOSIS — M25562 Pain in left knee: Secondary | ICD-10-CM

## 2018-03-24 DIAGNOSIS — M79671 Pain in right foot: Secondary | ICD-10-CM | POA: Diagnosis not present

## 2018-03-24 DIAGNOSIS — Z8639 Personal history of other endocrine, nutritional and metabolic disease: Secondary | ICD-10-CM | POA: Insufficient documentation

## 2018-03-24 DIAGNOSIS — M2550 Pain in unspecified joint: Secondary | ICD-10-CM

## 2018-03-24 DIAGNOSIS — R768 Other specified abnormal immunological findings in serum: Secondary | ICD-10-CM

## 2018-03-24 DIAGNOSIS — B009 Herpesviral infection, unspecified: Secondary | ICD-10-CM

## 2018-03-24 DIAGNOSIS — Z8659 Personal history of other mental and behavioral disorders: Secondary | ICD-10-CM | POA: Insufficient documentation

## 2018-03-24 DIAGNOSIS — E559 Vitamin D deficiency, unspecified: Secondary | ICD-10-CM

## 2018-03-24 NOTE — Patient Instructions (Signed)

## 2018-03-29 LAB — 14-3-3 ETA PROTEIN: 14-3-3 eta Protein: <0.2 ng/mL (ref <0.2)

## 2018-03-29 LAB — HLA-B27 ANTIGEN: HLA-B27 Antigen: NEGATIVE

## 2018-03-29 LAB — CYCLIC CITRUL PEPTIDE ANTIBODY, IGG

## 2018-03-29 LAB — ANA: Anti Nuclear Antibody(ANA): NEGATIVE

## 2018-03-29 LAB — RHEUMATOID FACTOR: Rheumatoid fact SerPl-aCnc: <14 IU/mL (ref <14)

## 2018-03-29 LAB — SEDIMENTATION RATE: Sed Rate: 6 mm/h (ref 0–20)

## 2018-03-29 LAB — CK: Total CK: 110 U/L (ref 29–143)

## 2018-03-29 LAB — URIC ACID: Uric Acid, Serum: 5.5 mg/dL (ref 2.5–7.0)

## 2018-03-30 ENCOUNTER — Ambulatory Visit (INDEPENDENT_AMBULATORY_CARE_PROVIDER_SITE_OTHER): Payer: BLUE CROSS/BLUE SHIELD | Admitting: Family Medicine

## 2018-04-04 ENCOUNTER — Ambulatory Visit (INDEPENDENT_AMBULATORY_CARE_PROVIDER_SITE_OTHER): Payer: BLUE CROSS/BLUE SHIELD | Admitting: Psychology

## 2018-04-04 ENCOUNTER — Ambulatory Visit (INDEPENDENT_AMBULATORY_CARE_PROVIDER_SITE_OTHER): Payer: BLUE CROSS/BLUE SHIELD | Admitting: Physician Assistant

## 2018-04-04 VITALS — BP 101/68 | HR 58 | Temp 98.3°F | Ht 65.0 in | Wt 214.0 lb

## 2018-04-04 DIAGNOSIS — Z6835 Body mass index (BMI) 35.0-35.9, adult: Secondary | ICD-10-CM

## 2018-04-04 DIAGNOSIS — F3289 Other specified depressive episodes: Secondary | ICD-10-CM

## 2018-04-04 DIAGNOSIS — E559 Vitamin D deficiency, unspecified: Secondary | ICD-10-CM | POA: Diagnosis not present

## 2018-04-04 NOTE — Progress Notes (Signed)
Office: [phone]  /  Fax: [phone]   HPI:   Chief Complaint: OBESITY Kathy [name] is here to discuss her progress with her obesity treatment plan. She is on the Category 2 plan + 100 calories and is following her eating plan approximately 95% of the time. She states she is dancing 60 minutes 1 time per week.  Carmela did well with weight loss. She reports that she would like some vegetarian options as she is bored with dinner. Her weight is 214 lb (97.1 kg) today and has had a weight loss of 3 pounds over a period of 3 weeks since her last visit. She has lost 8 lbs since starting treatment with [place].  Vitamin D deficiency Kathy [name] has a diagnosis of Vitamin D deficiency. She is currently taking prescription Vit D and denies nausea, vomiting or muscle weakness.  ASSESSMENT AND PLAN:  Vitamin D deficiency - Plan: Vitamin D, Ergocalciferol, (DRISDOL) 1.25 MG (50000 UT) CAPS capsule  Class 2 severe obesity with serious comorbidity and body mass index (BMI) of 35.0 to 35.9 in adult, unspecified obesity type (HCC)  PLAN:  Vitamin D Deficiency Meline was informed that low Vitamin D levels contributes to fatigue and are associated with obesity, breast, and colon cancer. She agrees to continue to take prescription Vit D @ 50,000 IU every week and will follow-up for routine testing of Vitamin D, at least 2-3 times per year. She was informed of the risk of over-replacement of Vitamin D and agrees to not increase her dose unless she discusses this with [place] first. Aniiya agrees to follow-up with our clinic in 2 weeks.  I spent > than 50% of the 15 minute visit on counseling as documented in the note.  Obesity Tyeshia is currently in the action stage of change. As such, her goal is to continue with weight loss efforts. She has agreed to follow the Category 2 plan + 100 calories. Dollicia has been instructed to work up to a goal of 150 minutes of combined cardio and strengthening exercise per week  for weight loss and overall health benefits. We discussed the following Behavioral Modification Strategies today: work on meal planning and easy cooking plans and ways to avoid boredom eating.  Ankita has agreed to follow-up with our clinic in 2 weeks. She was informed of the importance of frequent follow up visits to maximize her success with intensive lifestyle modifications for her multiple health conditions.  ALLERGIES: No Known Allergies  MEDICATIONS: Current Outpatient Medications on File Prior to Visit  Medication Sig Dispense Refill  . acetaminophen (TYLENOL) 650 MG CR tablet Take 650 mg by mouth every 8 (eight) hours as needed for pain.    Marland Kitchen ALPRAZolam (XANAX) 0.5 MG tablet Take 0.5 mg by mouth 2 (two) times daily as needed for anxiety.    . metroNIDAZOLE (METROGEL) 0.75 % vaginal gel as needed.    . Multiple Vitamins-Minerals (WOMENS ONE DAILY) TABS Take 1 tablet by mouth daily.    . valACYclovir (VALTREX) 500 MG tablet Take 500 mg by mouth daily as needed.     . Vitamin D, Ergocalciferol, (DRISDOL) 1.25 MG (50000 UT) CAPS capsule Take 1 capsule (50,000 Units total) by mouth every 7 (seven) days. 4 capsule 0   No current facility-administered medications on file prior to visit.     PAST MEDICAL HISTORY: Past Medical History:  Diagnosis Date  . Anxiety   . Arthritis   . Back pain   . Chest pain   . Dizziness   .  Fatigue   . Generalized headaches   . Hand tingling   . Hip pain   . HLD (hyperlipidemia)   . HSV (herpes simplex virus) infection   . Knee pain   . Multiple food allergies    Avocado, Banana, [name], [name], sometimes apples:  Nausea, vomitting itching in throat  . Palpitations   . Postpartum care following vaginal delivery 10/05/2010    PAST SURGICAL HISTORY: Past Surgical History:  Procedure Laterality Date  . NO PAST SURGERIES      SOCIAL HISTORY: Social History   Tobacco Use  . Smoking status: Former Smoker    Packs/day: 1.00    Years:  10.00    Pack years: 10.00    Types: Cigarettes    Last attempt to quit: 2007    Years since quitting: 13.1  . Smokeless tobacco: Never Used  Substance Use Topics  . Alcohol use: Yes    Comment: rare on ocassion  . Drug use: No    FAMILY HISTORY: Family History  Problem Relation Age of Onset  . Lymphoma Mother   . Breast cancer Maternal Grandmother   . Diabetes Maternal Grandmother   . Diabetes Maternal Grandfather   . Cancer Maternal Grandfather        Unsure if Prostate or colon  . Diabetes Paternal Grandmother   . Diabetes Paternal Grandfather   . Obesity Father    ROS: Review of Systems  Constitutional: Positive for weight loss.  Gastrointestinal: Negative for nausea and vomiting.  Musculoskeletal:       Negative for muscle weakness.  Endo/Heme/Allergies:       Negative for hypoglycemia.   PHYSICAL EXAM: Blood pressure 101/68, pulse (!) 58, temperature 98.3 F (36.8 C), temperature source Oral, height  (1.651 m), weight 214 lb (97.1 kg), last menstrual period 03/07/2018, SpO2 97 %. Body mass index is 35.61 kg/m. Physical Exam Vitals signs reviewed.  Constitutional:      Appearance: Normal appearance. She is obese.  Cardiovascular:     Rate and Rhythm: Normal rate.     Pulses: Normal pulses.  Pulmonary:     Effort: Pulmonary effort is normal.     Breath sounds: Normal breath sounds.  Musculoskeletal: Normal range of motion.  Skin:    General: Skin is warm and dry.  Neurological:     Mental Status: She is alert and oriented to person, place, and time.  Psychiatric:        Behavior: Behavior normal.   RECENT LABS AND TESTS: BMET    Component Value Date/Time   NA 138 03/01/2018 1120   K 4.7 03/01/2018 1120   CL 103 03/01/2018 1120   CO2 21 03/01/2018 1120   GLUCOSE 91 03/01/2018 1120   GLUCOSE 93 12/07/2017 1600   BUN 10 03/01/2018 1120   CREATININE 0.77 03/01/2018 1120   CREATININE 0.78 11/02/2014 1540   CALCIUM 9.3 03/01/2018 1120    GFRNONAA 97 03/01/2018 1120   GFRAA 112 03/01/2018 1120   Lab Results  Component Value Date   HGBA1C 5.4 03/01/2018   Lab Results  Component Value Date   INSULIN 18.8 03/01/2018   CBC    Component Value Date/Time   WBC 7.7 03/01/2018 1120   WBC 8.4 12/07/2017 1600   RBC 4.99 03/01/2018 1120   RBC 4.76 12/07/2017 1600   HGB 13.4 03/01/2018 1120   HCT 41.8 03/01/2018 1120   PLT 175.0 12/07/2017 1600   MCV 84 03/01/2018 1120   MCH 26.9 03/01/2018 1120  MCH 27.0 11/02/2014 1540   MCHC 32.1 03/01/2018 1120   MCHC 33.5 12/07/2017 1600   RDW 13.2 03/01/2018 1120   LYMPHSABS 2.3 03/01/2018 1120   MONOABS 0.6 12/07/2017 1600   EOSABS 0.1 03/01/2018 1120   BASOSABS 0.0 03/01/2018 1120   Iron/TIBC/Ferritin/ %Sat No results found for: IRON, TIBC, FERRITIN, IRONPCTSAT Lipid Panel     Component Value Date/Time   CHOL 162 03/01/2018 1120   TRIG 76 03/01/2018 1120   HDL 47 03/01/2018 1120   CHOLHDL 3 12/07/2017 1600   VLDL 27.8 12/07/2017 1600   LDLCALC 100 (H) 03/01/2018 1120   Hepatic Function Panel     Component Value Date/Time   PROT 6.9 03/01/2018 1120   ALBUMIN 4.5 03/01/2018 1120   AST 19 03/01/2018 1120   ALT 23 03/01/2018 1120   ALKPHOS 47 03/01/2018 1120   BILITOT 0.3 03/01/2018 1120      Component Value Date/Time   TSH 1.830 03/01/2018 1120   TSH 1.62 12/07/2017 1600   TSH 1.62 04/05/2017 1645    Ref. Range 03/01/2018 11:20  Vitamin D, 25-Hydroxy Latest Ref Range: 30.0 - 100.0 ng/mL 36.9   OBESITY BEHAVIORAL INTERVENTION VISIT  Today's visit was #3  Starting weight: 222 lbs Starting date: 03/01/2018 Today's weight: 214 lbs  Today's date: 04/04/2018 Total lbs lost to date: 8    04/04/2018  Height 5\' 5"  (1.651 m)  Weight 214 lb (97.1 kg)  BMI (Calculated) 35.61  BLOOD PRESSURE - SYSTOLIC 101  BLOOD PRESSURE - DIASTOLIC 68   Body Fat % 40.3 %  Total Body Water (lbs) 85.4 lbs   ASK: We discussed the diagnosis of obesity with [name] today  and Laddie agreed to give [place] permission to discuss obesity behavioral modification therapy today.  ASSESS: Hulene has the diagnosis of obesity and her BMI today is 35.61. Tawanna is in the action stage of change.   ADVISE: Angeleen was educated on the multiple health risks of obesity as well as the benefit of weight loss to improve her health. She was advised of the need for long term treatment and the importance of lifestyle modifications to improve her current health and to decrease her risk of future health problems.  AGREE: Multiple dietary modification options and treatment options were discussed and  Georgine agreed to follow the recommendations documented in the above note.  ARRANGE: Natonia was educated on the importance of frequent visits to treat obesity as outlined per CMS and USPSTF guidelines and agreed to schedule her next follow up appointment today.  Fernanda Drum, am acting as transcriptionist for Alois Cliche, PA-C I, Alois Cliche, PA-C have reviewed above note and agree with its content
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
## 2018-04-05 MED ORDER — VITAMIN D (ERGOCALCIFEROL) 1.25 MG (50000 UNIT) PO CAPS: 50000.0000 [IU] | ORAL_CAPSULE | ORAL | 0 refills | Status: DC

## 2018-04-11 NOTE — Progress Notes (Deleted)
Office Visit Note  Patient: Kathy [name]             Date of Birth: October 16, 1975           MRN: [id number]             PCP: Ann Held, DO Referring: Ann Held, * Visit Date: 04/21/2018 Occupation: _0 @  Subjective:  No chief complaint on file.   History of Present Illness: Kathy [name] is a 43 y.o. female ***   Activities of Daily Living:  Patient reports morning stiffness for *** {minute/hour:19697}.   Patient {ACTIONS;DENIES/REPORTS:21021675::"Denies"} nocturnal pain.  Difficulty dressing/grooming: {ACTIONS;DENIES/REPORTS:21021675::"Denies"} Difficulty climbing stairs: {ACTIONS;DENIES/REPORTS:21021675::"Denies"} Difficulty getting out of chair: {ACTIONS;DENIES/REPORTS:21021675::"Denies"} Difficulty using hands for taps, buttons, cutlery, and/or writing: {ACTIONS;DENIES/REPORTS:21021675::"Denies"}  No Rheumatology ROS completed.   PMFS History:  Patient Active Problem List   Diagnosis Date Noted  . Vitamin D deficiency 03/24/2018  . Family history of systemic lupus erythematosus 03/24/2018  . HSV (herpes simplex virus) infection 03/24/2018  . History of anxiety 03/24/2018  . History of hyperlipidemia 03/24/2018  . Abnormal smell 02/11/2017  . Chest pain 02/11/2017  . Biological false positive RPR test 11/26/2016    Past Medical History:  Diagnosis Date  . Anxiety   . Arthritis   . Back pain   . Chest pain   . Dizziness   . Fatigue   . Generalized headaches   . Hand tingling   . Hip pain   . HLD (hyperlipidemia)   . HSV (herpes simplex virus) infection   . Knee pain   . Multiple food allergies    Avocado, Banana, [name], [name], sometimes apples:  Nausea, vomitting itching in throat  . Palpitations   . Postpartum care following vaginal delivery 10/05/2010    Family History  Problem Relation Age of Onset  . Lymphoma Mother   . Breast cancer Maternal Grandmother   . Diabetes Maternal Grandmother   . Diabetes  Maternal Grandfather   . Cancer Maternal Grandfather        Unsure if Prostate or colon  . Diabetes Paternal Grandmother   . Diabetes Paternal Grandfather   . Obesity Father    Past Surgical History:  Procedure Laterality Date  . NO PAST SURGERIES     Social History   Social History Narrative  . Not on file   Immunization History  Administered Date(s) Administered  . Influenza Inj Mdck Quad Pf 11/10/2017  . Influenza,inj,Quad PF,6+ Mos 11/26/2015, 11/26/2016  . Tdap 10/06/2010     Objective: Vital Signs: There were no vitals taken for this visit.   Physical Exam   Musculoskeletal Exam: ***  CDAI Exam: CDAI Score: Not documented Patient Global Assessment: Not documented; Provider Global Assessment: Not documented Swollen: Not documented; Tender: Not documented Joint Exam   Not documented   There is currently no information documented on the homunculus. Go to the Rheumatology activity and complete the homunculus joint exam.  Investigation: No additional findings.  Imaging: Xr Foot 2 Views Left  Result Date: 03/24/2018 No MTP, PIP or DIP narrowing was noted.  No intertarsal joint space narrowing was noted. Impression: Unremarkable x-ray of the foot.  Xr Foot 2 Views Right  Result Date: 03/24/2018 No MTP, PIP or DIP narrowing was noted.  No intertarsal joint space narrowing was noted. Impression: Unremarkable x-ray of the foot.  Xr Hand 2 View Left  Result Date: 03/24/2018 DIP spurring was noted.  No PIP MCP intercarpal or radiocarpal joint space  narrowing was noted.  No erosive changes were noted. 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  Xr Hand 2 View Right  Result Date: 03/24/2018 DIP spurring was noted.  No PIP MCP intercarpal or radiocarpal joint space narrowing was noted.  No erosive changes were noted. 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  Xr Knee 3 View Left  Result Date: 03/24/2018 Mild medial compartment narrowing was  noted.  Mild patellofemoral narrowing was noted.  No chondrocalcinosis was noted. 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  Xr Knee 3 View Right  Result Date: 03/24/2018 No medial lateral compartment narrowing was noted.  Mild patellofemoral narrowing was noted.  No chondrocalcinosis was noted. 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  Xr Lumbar Spine 2-3 Views  Result Date: 03/24/2018 No disc space narrowing was noted.  No facet joint arthropathy was noted. Impression: Unremarkable x-ray of the lumbar spine.  Xr Pelvis 1-2 Views  Result Date: 03/24/2018 No SI joint narrowing or sclerosis was noted.  No hip joint narrowing was noted. Impression: Unremarkable x-ray of the SI joints and hip joints.   Recent Labs: Lab Results  Component Value Date   WBC 7.7 03/01/2018   HGB 13.4 03/01/2018   PLT 175.0 12/07/2017   NA 138 03/01/2018   K 4.7 03/01/2018   CL 103 03/01/2018   CO2 21 03/01/2018   GLUCOSE 91 03/01/2018   BUN 10 03/01/2018   CREATININE 0.77 03/01/2018   BILITOT 0.3 03/01/2018   ALKPHOS 47 03/01/2018   AST 19 03/01/2018   ALT 23 03/01/2018   PROT 6.9 03/01/2018   ALBUMIN 4.5 03/01/2018   CALCIUM 9.3 03/01/2018   GFRAA 112 03/01/2018  March 24, 2018 CK 110, ESR 6, RF negative, anti-CCP negative, _0 eta negative, ANA negative, uric acid 5.5, HLA-B27 negative  Speciality Comments: No specialty comments available.  Procedures:  No procedures performed Allergies: Patient has no known allergies.   Assessment / Plan:     Visit Diagnoses: No diagnosis found.   Orders: No orders of the defined types were placed in this encounter.  No orders of the defined types were placed in this encounter.   Face-to-face time spent with patient was *** minutes. Greater than 50% of time was spent in counseling and coordination of care.  Follow-Up Instructions: No follow-ups on  file.   Bo Merino, MD  Note - This record has been created using Editor, commissioning.  Chart creation errors have been sought, but may not always  have been located. Such creation errors do not reflect on  the standard of medical care.

## 2018-04-18 ENCOUNTER — Encounter: Payer: Self-pay | Admitting: Rheumatology

## 2018-04-21 ENCOUNTER — Ambulatory Visit: Payer: BLUE CROSS/BLUE SHIELD | Admitting: Rheumatology

## 2018-04-21 NOTE — Progress Notes (Signed)
Office: [phone]  /  Fax: [phone]    Date: 04/25/2018   Time Seen: 12:00pm Duration: 28 minutes Provider: Lawerance Cruel, Psy.D. Type of Session: Individual Therapy  Type of Contact: Face-to-face  Session Content: Kathy [name] is a 43 y.o. female presenting for a follow-up appointment to address the previously established treatment goal of decreasing emotional eating. The session was initiated with the administration of the PHQ-9 and GAD-7, as well as a brief check-in. Kathy [name] shared concern related to COVID-19; however, noted, her "angst was eased" when she was told she could work from home. She also discussed "trying to navigate" working and taking care of her daughter. Notably, due to COVID-19, future appointments at this provider's clinic will not be in-person. Kathy [name] acknowledged understanding, and agreed to telepsychological services for upcoming appointments.  Regarding eating, Kathy [name] stated, "I feel like I'm falling a little bit off track." This was explored. She explained she is "allowing" herself to "have a glass of wine," which she was not consuming before. Kathy [name] noted, "It helps keep me calm."  Nevertheless, she discussed engaging in portion control and "making good choices." Regarding emotional eating, Kathy [name] denied episodes of emotional eating. Furthermore, psychoeducation regarding triggers for emotional eating was provided. Kathy [name] was provided a handout, and encouraged to utilize the handout between now and the next appointment to increase awareness of triggers and frequency. Kathy [name] agreed. This provider also discussed behavioral strategies for specific triggers, such as placing the utensil down when conversing to avoid mindless eating. Kathy [name] was receptive to today's session as evidenced by openness to sharing, responsiveness to feedback, and willingness to explore triggers for emotional eating.  Mental Status Examination: Kathy [name] arrived on time for the appointment. She  presented as appropriately dressed and groomed. Kathy [name] appeared her stated age and demonstrated adequate orientation to time, place, person, and purpose of the appointment. She also demonstrated appropriate eye contact. No psychomotor abnormalities or behavioral peculiarities noted. Her mood was euthymic with congruent affect. Her thought processes were logical, linear, and goal-directed. No hallucinations, delusions, bizarre thinking or behavior reported or observed. Judgment, insight, and impulse control appeared to be grossly intact. There was no evidence of paraphasias (i.e., errors in speech, gross mispronunciations, and word substitutions), repetition deficits, or disturbances in volume or prosody (i.e., rhythm and intonation). There was no evidence of attention or memory impairments. Kathy [name] denied current suicidal and homicidal ideation, plan and intent.   Structured Assessment Results: The Patient Health Questionnaire-9 (PHQ-9) is a self-report measure that assesses symptoms and severity of depression over the course of the last two weeks. Kathy [name] obtained a score of 4 suggesting minimal depression. Kathy [name] finds the endorsed symptoms to be not difficult at all. Depression screen [facility] 2/9 04/25/2018  Decreased Interest 0  Down, Depressed, Hopeless 1  PHQ - 2 Score 1  Altered sleeping 0  Tired, decreased energy 1  Change in appetite 1  Feeling bad or failure about yourself  0  Trouble concentrating 1  Moving slowly or fidgety/restless 0  Suicidal thoughts 0  PHQ-9 Score 4  Difficult doing work/chores -   The Generalized Anxiety Disorder-7 (GAD-7) is a brief self-report measure that assesses symptoms of anxiety over the course of the last two weeks. Kathy [name] obtained a score of 4 suggesting minimal anxiety. GAD 7 : Generalized Anxiety Score 04/25/2018  Nervous, Anxious, on Edge 1  Control/stop worrying 1  Worry too much - different things 1  Trouble relaxing 0  Restless 0  Easily annoyed  or irritable 0  Afraid -  awful might happen 1  Total GAD 7 Score 4  Anxiety Difficulty Not difficult at all   Interventions:  Administration of PHQ-9 and GAD-7 for symptom monitoring Review of content from the previous session Empathic reflections and validation Psychoeducation regarding triggers for emotional eating Positive reinforcement Rapport building Brief chart review  DSM-5 Diagnosis: 311 (F32.8) Other Specified Depressive Disorder, Emotional Eating Behaviors  Treatment Goal & Progress: During the initial appointment with this provider, the following treatment goal was established: decrease emotional eating. Progress is limited, as Kathy [name] has just begun treatment with this provider; however, she is receptive to the interaction and interventions and rapport is being established. Nevertheless, Kathy [name] has demonstrated some progress in her goal as evidenced by increased awareness of hunger patterns.   Plan: Kathy [name] continues to appear able and willing to participate as evidenced by engagement in reciprocal conversation, and asking questions for clarification as appropriate. The next appointment will be scheduled in two weeks. The next session will focus on reviewing triggers for emotional eating, and the introduction of mindfulness.

## 2018-04-25 ENCOUNTER — Ambulatory Visit (INDEPENDENT_AMBULATORY_CARE_PROVIDER_SITE_OTHER): Payer: BLUE CROSS/BLUE SHIELD | Admitting: Physician Assistant

## 2018-04-25 ENCOUNTER — Other Ambulatory Visit: Payer: Self-pay

## 2018-04-25 ENCOUNTER — Ambulatory Visit (INDEPENDENT_AMBULATORY_CARE_PROVIDER_SITE_OTHER): Payer: BLUE CROSS/BLUE SHIELD | Admitting: Psychology

## 2018-04-25 ENCOUNTER — Encounter (INDEPENDENT_AMBULATORY_CARE_PROVIDER_SITE_OTHER): Payer: Self-pay | Admitting: Physician Assistant

## 2018-04-25 VITALS — BP 107/72 | HR 59 | Temp 98.3°F | Ht 65.0 in | Wt 210.0 lb

## 2018-04-25 DIAGNOSIS — F3289 Other specified depressive episodes: Secondary | ICD-10-CM

## 2018-04-25 DIAGNOSIS — Z6835 Body mass index (BMI) 35.0-35.9, adult: Secondary | ICD-10-CM | POA: Diagnosis not present

## 2018-04-25 DIAGNOSIS — Z9189 Other specified personal risk factors, not elsewhere classified: Secondary | ICD-10-CM

## 2018-04-25 DIAGNOSIS — E559 Vitamin D deficiency, unspecified: Secondary | ICD-10-CM

## 2018-04-25 NOTE — Progress Notes (Signed)
Office: [phone]  /  Fax: [phone]   HPI:   Chief Complaint: OBESITY Kathy [name] is here to discuss her progress with her obesity treatment plan. She is on the Category 2 plan + 100 calories and is following her eating plan approximately 85% of the time. She states she is belly dancing 60 minutes 1 time per week. Kathy [name] did well with weight loss. She reports that she is using beef sticks to substitute for cheese. She is also using wine as some of her snack calories on some nights. Her weight is 210 lb (95.3 kg) today and has had a weight loss of 4 pounds over a period of 3 weeks since her last visit. She has lost 12 lbs since starting treatment with [place].  Vitamin D deficiency Kathy [name] has a diagnosis of Vitamin D deficiency. She is currently taking prescription Vit D and denies nausea, vomiting, or muscle weakness.  At risk for osteopenia and osteoporosis Kathy [name] is at higher risk of osteopenia and osteoporosis due to Vitamin D deficiency.   Depression with emotional eating behaviors Kathy [name] is struggling with emotional eating and using food for comfort to the extent that it is negatively impacting her health. She often snacks when she is not hungry. Kathy [name] sometimes feels she is out of control and then feels guilty that she made poor food choices. She has been working on behavior modification techniques to help reduce her emotional eating and has been somewhat successful. She is on no medication and shows no sign of suicidal or homicidal ideations.  Depression screen Kathy [name]  Decreased Interest 0 1 1 0 0  Down, Depressed, Hopeless 0  PHQ - 2 Score 0  Altered sleeping 0 0 0 0 -  Tired, decreased energy -  Change in appetite 1 0 3 1 -  Feeling bad or failure about yourself  0 -  Trouble concentrating 1 0 0 0 -  Moving slowly or fidgety/restless 0 0 0 0 -  Suicidal thoughts 0 0 0 0 -  PHQ-9 Score -  Difficult doing work/chores - - Not difficult at all Not difficult at all -   ASSESSMENT AND PLAN:  Vitamin D deficiency - Plan: Vitamin D, Ergocalciferol, (DRISDOL) 1.25 MG (50000 UT) CAPS capsule  Other depression - with emotional eating  At risk for osteoporosis  Class 2 severe obesity with serious comorbidity and body mass index (BMI) of 35.0 to 35.9 in adult, unspecified obesity type (HCC)  PLAN:  Vitamin D Deficiency Kathy [name] was informed that low Vitamin D levels contributes to fatigue and are associated with obesity, breast, and colon cancer. She agrees to continue to take prescription Vit D @ 50,000 IU every week #4 with 0 refills and will follow-up for routine testing of Vitamin D, at least 2-3 times per year. She was informed of the risk of over-replacement of Vitamin D and agrees to not increase her dose unless she discusses this with [place] first. Atlas agrees to follow-up with our clinic in 2 weeks.  At risk for osteopenia and osteoporosis Kathy [name] was given extended  (15 minutes) osteoporosis prevention counseling today. Kathy [name] is at risk for osteopenia and osteoporsis due to her Vitamin D deficiency. She was encouraged to take her Vitamin D and follow her higher calcium diet and increase strengthening exercise to help strengthen her bones and decrease her risk of  osteopenia and osteoporosis.  Depression with Emotional Eating Behaviors We discussed behavior modification techniques today to help Kathy [name] deal with her emotional eating and depression. She will continue to see Dr. Dewaine Conger as scheduled.  Obesity Kathy [name] is currently in the action stage of change. As such, her goal is to continue with weight loss efforts. She has agreed to follow the Category 2 plan + 100 calories. Kathy [name] has been instructed to work up to a goal of 150 minutes of combined cardio and strengthening exercise per week for weight loss and overall health benefits. We discussed the following  Behavioral Modification Strategies today: work on meal planning, easy cooking plans, and ways to avoid boredom eating.  Kathy [name] has agreed to follow-up with our clinic in 2 weeks. She was informed of the importance of frequent follow-up visits to maximize her success with intensive lifestyle modifications for her multiple health conditions.  ALLERGIES: No Known Allergies  MEDICATIONS: Current Outpatient Medications on File Prior to Visit  Medication Sig Dispense Refill  . acetaminophen (TYLENOL) 650 MG CR tablet Take 650 mg by mouth every 8 (eight) hours as needed for pain.    Marland Kitchen ALPRAZolam (XANAX) 0.5 MG tablet Take 0.5 mg by mouth 2 (two) times daily as needed for anxiety.    . metroNIDAZOLE (METROGEL) 0.75 % vaginal gel as needed.    . Multiple Vitamins-Minerals (WOMENS ONE DAILY) TABS Take 1 tablet by mouth daily.    . valACYclovir (VALTREX) 500 MG tablet Take 500 mg by mouth daily as needed.     . Vitamin D, Ergocalciferol, (DRISDOL) 1.25 MG (50000 UT) CAPS capsule Take 1 capsule (50,000 Units total) by mouth every 7 (seven) days. 4 capsule 0   No current facility-administered medications on file prior to visit.     PAST MEDICAL HISTORY: Past Medical History:  Diagnosis Date  . Anxiety   . Arthritis   . Back pain   . Chest pain   . Dizziness   . Fatigue   . Generalized headaches   . Hand tingling   . Hip pain   . HLD (hyperlipidemia)   . HSV (herpes simplex virus) infection   . Knee pain   . Multiple food allergies    Avocado, Banana, [name], [name], sometimes apples:  Nausea, vomitting itching in throat  . Palpitations   . Postpartum care following vaginal delivery 10/05/2010    PAST SURGICAL HISTORY: Past Surgical History:  Procedure Laterality Date  . NO PAST SURGERIES      SOCIAL HISTORY: Social History   Tobacco Use  . Smoking status: Former Smoker    Packs/day: 1.00    Years: 10.00    Pack years: 10.00    Types: Cigarettes    Last attempt to  quit: 2007    Years since quitting: 13.2  . Smokeless tobacco: Never Used  Substance Use Topics  . Alcohol use: Yes    Comment: rare on ocassion  . Drug use: No    FAMILY HISTORY: Family History  Problem Relation Age of Onset  . Lymphoma Mother   . Breast cancer Maternal Grandmother   . Diabetes Maternal Grandmother   . Diabetes Maternal Grandfather   . Cancer Maternal Grandfather        Unsure if Prostate or colon  . Diabetes Paternal Grandmother   . Diabetes Paternal Grandfather   . Obesity Father    ROS: Review of Systems  Constitutional: Positive for weight loss.  Gastrointestinal: Negative for nausea and vomiting.  Musculoskeletal:  Negative for muscle weakness.  Psychiatric/Behavioral: Positive for depression (emotional eating). Negative for suicidal ideas.       Negative for homicidal ideas.   PHYSICAL EXAM: Blood pressure 107/72, pulse (!) 59, temperature 98.3 F (36.8 C), temperature source Oral, height 5\' 5"  (1.651 m), weight 210 lb (95.3 kg), SpO2 97 %. Body mass index is 34.95 kg/m. Physical Exam Vitals signs reviewed.  Constitutional:      Appearance: Normal appearance. She is obese.  Cardiovascular:     Rate and Rhythm: Normal rate.     Pulses: Normal pulses.  Pulmonary:     Effort: Pulmonary effort is normal.     Breath sounds: Normal breath sounds.  Musculoskeletal: Normal range of motion.  Skin:    General: Skin is warm and dry.  Neurological:     Mental Status: She is alert and oriented to person, place, and time.  Psychiatric:        Behavior: Behavior normal.        Thought Content: Thought content does not include homicidal or suicidal ideation.   RECENT LABS AND TESTS: BMET    Component Value Date/Time   NA 138 03/01/2018 1120   K 4.7 03/01/2018 1120   CL 103 03/01/2018 1120   CO2 21 03/01/2018 1120   GLUCOSE 91 03/01/2018 1120   GLUCOSE 93 12/07/2017 1600   BUN 10 03/01/2018 1120   CREATININE 0.77 03/01/2018 1120    CREATININE 0.78 11/02/2014 1540   CALCIUM 9.3 03/01/2018 1120   GFRNONAA 97 03/01/2018 1120   GFRAA 112 03/01/2018 1120   Lab Results  Component Value Date   HGBA1C 5.4 03/01/2018   Lab Results  Component Value Date   INSULIN 18.8 03/01/2018   CBC    Component Value Date/Time   WBC 7.7 03/01/2018 1120   WBC 8.4 12/07/2017 1600   RBC 4.99 03/01/2018 1120   RBC 4.76 12/07/2017 1600   HGB 13.4 03/01/2018 1120   HCT 41.8 03/01/2018 1120   PLT 175.0 12/07/2017 1600   MCV 84 03/01/2018 1120   MCH 26.9 03/01/2018 1120   MCH 27.0 11/02/2014 1540   MCHC 32.1 03/01/2018 1120   MCHC 33.5 12/07/2017 1600   RDW 13.2 03/01/2018 1120   LYMPHSABS 2.3 03/01/2018 1120   MONOABS 0.6 12/07/2017 1600   EOSABS 0.1 03/01/2018 1120   BASOSABS 0.0 03/01/2018 1120   Iron/TIBC/Ferritin/ %Sat No results found for: IRON, TIBC, FERRITIN, IRONPCTSAT Lipid Panel     Component Value Date/Time   CHOL 162 03/01/2018 1120   TRIG 76 03/01/2018 1120   HDL 47 03/01/2018 1120   CHOLHDL 3 12/07/2017 1600   VLDL 27.8 12/07/2017 1600   LDLCALC 100 (H) 03/01/2018 1120   Hepatic Function Panel     Component Value Date/Time   PROT 6.9 03/01/2018 1120   ALBUMIN 4.5 03/01/2018 1120   AST 19 03/01/2018 1120   ALT 23 03/01/2018 1120   ALKPHOS 47 03/01/2018 1120   BILITOT 0.3 03/01/2018 1120      Component Value Date/Time   TSH 1.830 03/01/2018 1120   TSH 1.62 12/07/2017 1600   TSH 1.62 04/05/2017 1645   Results for KASH, PEZZANO (MRN [id number]) as of 04/25/2018 15:27  Ref. Range 03/01/2018 11:20  Vitamin D, 25-Hydroxy Latest Ref Range: 30.0 - 100.0 ng/mL 36.9   OBESITY BEHAVIORAL INTERVENTION VISIT  Today'Kathy [name] visit was #4  Starting weight: 222 lbs Starting date: 03/01/2018 Today'Kathy [name] weight: 210 lbs  Today'Kathy [name] date: 04/25/2018 Total lbs lost to date:  12    04/25/2018  Height  (1.651 m)  Weight 210 lb (95.3 kg)  BMI (Calculated) 34.95  BLOOD PRESSURE - SYSTOLIC 107  BLOOD PRESSURE -  DIASTOLIC 72   Body Fat % 39.7 %  Total Body Water (lbs) 84.6 lbs   ASK: We discussed the diagnosis of obesity with Kathy [name] today and Sarya agreed to give [place] permission to discuss obesity behavioral modification therapy today.  ASSESS: Jullie has the diagnosis of obesity and her BMI today is 34.95. William is in the action stage of change.   ADVISE: Genelle was educated on the multiple health risks of obesity as well as the benefit of weight loss to improve her health. She was advised of the need for long term treatment and the importance of lifestyle modifications to improve her current health and to decrease her risk of future health problems.  AGREE: Multiple dietary modification options and treatment options were discussed and  Denijah agreed to follow the recommendations documented in the above note.  ARRANGE: Reylene was educated on the importance of frequent visits to treat obesity as outlined per CMS and USPSTF guidelines and agreed to schedule her next follow up appointment today.  Fernanda Drum, am acting as transcriptionist for Alois Cliche, PA-C I, Alois Cliche, PA-C have reviewed above note and agree with its content

## 2018-04-26 ENCOUNTER — Encounter (INDEPENDENT_AMBULATORY_CARE_PROVIDER_SITE_OTHER): Payer: Self-pay

## 2018-04-26 MED ORDER — VITAMIN D (ERGOCALCIFEROL) 1.25 MG (50000 UNIT) PO CAPS: 50000.0000 [IU] | ORAL_CAPSULE | ORAL | 0 refills | Status: DC

## 2018-05-09 ENCOUNTER — Other Ambulatory Visit: Payer: Self-pay

## 2018-05-09 ENCOUNTER — Encounter (INDEPENDENT_AMBULATORY_CARE_PROVIDER_SITE_OTHER): Payer: Self-pay | Admitting: Physician Assistant

## 2018-05-09 ENCOUNTER — Ambulatory Visit (INDEPENDENT_AMBULATORY_CARE_PROVIDER_SITE_OTHER): Payer: BLUE CROSS/BLUE SHIELD | Admitting: Physician Assistant

## 2018-05-09 ENCOUNTER — Ambulatory Visit (INDEPENDENT_AMBULATORY_CARE_PROVIDER_SITE_OTHER): Payer: BLUE CROSS/BLUE SHIELD | Admitting: Psychology

## 2018-05-09 DIAGNOSIS — E66811 Obesity, class 1: Secondary | ICD-10-CM

## 2018-05-09 DIAGNOSIS — E7849 Other hyperlipidemia: Secondary | ICD-10-CM | POA: Diagnosis not present

## 2018-05-09 DIAGNOSIS — E559 Vitamin D deficiency, unspecified: Secondary | ICD-10-CM

## 2018-05-09 DIAGNOSIS — E669 Obesity, unspecified: Secondary | ICD-10-CM | POA: Diagnosis not present

## 2018-05-09 DIAGNOSIS — F3289 Other specified depressive episodes: Secondary | ICD-10-CM

## 2018-05-09 DIAGNOSIS — Z6834 Body mass index (BMI) 34.0-34.9, adult: Secondary | ICD-10-CM

## 2018-05-09 NOTE — Progress Notes (Signed)
Office: [phone]  /  Fax: [phone]    Date: May 09, 2018 Appointment Start Time: 8:37am Duration: 23 minutes Provider: Lawerance Cruel, Psy.D. Type of Session: Individual Therapy  Location of Patient: Home-bedroom Location of Provider: Office  Type of Contact: Telepsychological Visit via [employer]   Session Content:Prior to initiating telepsychological services, Caton was provided with an informed consent document via e-mail, which included the development of a safety plan (I.e., an emergency contact and emergency resources) in the event of an emergency/crisis. Alsion was unable to complete the form and return it to this provider prior to today's appointment. As such, this provider verbally discussed the consent form during today's appointment. Abbey provided an emergency contact, and verbally acknowledged understanding that she is ultimately responsible for understanding her insurance benefits as it relates to reimbursement of telepsychological services. This provider also reviewed confidentiality, as it relates to telepsychological services, as well as the rationale for telepsychological services. More specifically, this provider's clinic is closed for in-person visits due to COVID-19. Therapeutic services will resume to in-person appointments once the clinic re-opens. Mariaceleste expressed understanding regarding the rationale for telepsychological services. In addition, this provider explained the telepsychological services informed consent document would be considered an addendum to the initial consent document. Dymon verbally consented to proceed. Regarding MyChart messages, Mattelyn verbally acknowledged understanding that any messages sent would be a part of her electronic medical record; therefore, visible to all providers.   Notably, this provider called Raegen at 8:32am, as she called this provider's clinic and stated she was waiting for this provider to call. This provider  walked Shavonna through the steps to connect, and initially there were audio difficulties, as she could not hear the provider. Thus, the appointment was initiated at 8:37am.    Silbia is a 43 y.o. female presenting via [employer] for a follow-up appointment to address the previously established treatment goal of decreasing emotional eating. Prior to proceeding with today's appointment, Tyler's physical location at the time of this appointment was obtained. Mariko reported she was at home in her bedroom and provided the address. In the event of technical difficulties, Cian shared a phone number she could be reached at. [name] and this provider participated in today's telepsychological service. Also, Benigna denied anyone else being present in the room or on the [employer]. After discussing the informed consent, this provider verbally administered the PHQ-9 and GAD-7, and conducted a brief check-in. She explained the endorsed items on the measures are secondary to COVID-19. Suzie shared it has been "difficult staying home" and noted both her daughters are home with her. She initially noted she is not following the structured meal plan, and noted an increase in snacking. This was explored. Morna acknowledged she is following the structured meal plan for breakfast, but the rest of the meals are difficult especially dinner. This provider explored barriers to compliance with the structured meal plan. She indicated her children want to eat something different, and she does not want to have to cook separate meals. She also described engaging in all or nothing thinking; therefore, psychoeducation was provided. Additionally, this provider engaged Chakita in goal setting to increase protein intake, as it also assists with feeling full longer. She was agreeable to increasing protein intake by 20%. Chellsey was receptive to today's session as evidenced by openness to sharing, responsiveness to feedback, and  willingness to increase protein intake.  Mental Status Examination:  Appearance: neat Behavior: cooperative Mood: euthymic Affect: mood congruent Speech: normal in rate,  volume, and tone Eye Contact: appropriate Psychomotor Activity: appropriate Thought Process: linear, logical, and goal directed  Content/Perceptual Disturbances: denies suicidal and homicidal ideation, plan, and intent and no hallucinations, delusions, bizarre thinking or behavior reported or observed Orientation: time, person, place and purpose of appointment Cognition/Sensorium: memory, attention, language, and fund of knowledge intact  Insight: fair Judgment: fair  Structured Assessment Results: The Patient Health Questionnaire-9 (PHQ-9) is a self-report measure that assesses symptoms and severity of depression over the course of the last two weeks. Dillan obtained a score of 2 suggesting minimal depression. Chenoah finds the endorsed symptoms to be not difficult at all. Depression screen PHQ 2/9 05/09/2018  Decreased Interest 1  Down, Depressed, Hopeless 1  PHQ - 2 Score 2  Altered sleeping 0  Tired, decreased energy 0  Change in appetite 0  Feeling bad or failure about yourself  0  Trouble concentrating 0  Moving slowly or fidgety/restless 0  Suicidal thoughts 0  PHQ-9 Score 2  Difficult doing work/chores -   The Generalized Anxiety Disorder-7 (GAD-7) is a brief self-report measure that assesses symptoms of anxiety over the course of the last two weeks. Misheel obtained a score of 4 suggesting minimal anxiety. GAD 7 : Generalized Anxiety Score 05/09/2018  Nervous, Anxious, on Edge 1  Control/stop worrying 0  Worry too much - different things 1  Trouble relaxing 0  Restless 0  Easily annoyed or irritable 1  Afraid - awful might happen 1  Total GAD 7 Score 4  Anxiety Difficulty Not difficult at all   Interventions:  Administration of PHQ-9 and GAD-7 for symptom monitoring Empathic reflections and  validation Goal setting Employed supportive psychotherapy interventions today to facilitate reduced distress, and to improve coping skills with identified stressors  Explored barriers for compliance of the structured meal plan  Psychoeducation about all or nothing thinking was provided Brief chart review  DSM-5 Diagnosis: 311 (F32.8) Other Specified Depressive Disorder, Emotional Eating Behaviors   Treatment Goal & Progress: During the initial appointment with this provider, the following treatment goal was established: decrease emotional eating. Peyton has demonstrated progress in her goal as evidenced by increased awareness of hunger patterns and triggers for emotional eating. Despite an increase in emotional eating, Alionna demonstrated willingness to increasing protein intake.   Plan: Clauda continues to appear able and willing to participate as evidenced by engagement in reciprocal conversation, and asking questions for clarification as appropriate. The next appointment will be scheduled in two weeks, which will be via [employer]. Once this provider's office resumes in-person appointments, Abbee will be notified. The next session will focus on the introduction of mindfulness, as it was not introduced during today's appointment.

## 2018-05-10 ENCOUNTER — Encounter (INDEPENDENT_AMBULATORY_CARE_PROVIDER_SITE_OTHER): Payer: Self-pay | Admitting: Physician Assistant

## 2018-05-10 MED ORDER — VITAMIN D (ERGOCALCIFEROL) 1.25 MG (50000 UNIT) PO CAPS: 50000.0000 [IU] | ORAL_CAPSULE | ORAL | 0 refills | Status: DC

## 2018-05-10 NOTE — Progress Notes (Signed)
Office: [phone]  /  Fax: [phone] TeleHealth Visit:  Kathy [name] has verbally consented to this TeleHealth visit today. The patient is located at home, the provider is located at the [facility] office. The participants in this visit include the listed provider and patient. The visit was conducted today via FaceTime.  HPI:   Chief Complaint: OBESITY Kathy [name] is here to discuss her progress with her obesity treatment plan. She is on the Category 2 plan + 100 calories and is following her eating plan approximately 0% of the time. She states she is walking 30 minutes 7 times per week. Kathy [name] reports that she has not been eating on the plan due to working from home. She has been snacking on her children's chips and other snack foods.  We were unable to weigh the patient today for this TeleHealth visit. She feels as if she has gained 6 lbs since her last visit. She has lost 12 lbs since starting treatment with [place].  Vitamin D deficiency Kathy [name] has a diagnosis of Vitamin D deficiency. She is currently taking prescription Vit D and denies nausea, vomiting or muscle weakness.  Hyperlipidemia Kathy [name] has hyperlipidemia and has been trying to improve her cholesterol levels with intensive lifestyle modification including a low saturated fat diet, exercise and weight loss. She is on no medication and denies any chest pain.  ASSESSMENT AND PLAN:  Vitamin D deficiency - Plan: Vitamin D, Ergocalciferol, (DRISDOL) 1.25 MG (50000 UT) CAPS capsule  Other hyperlipidemia  Class 1 obesity with serious comorbidity and body mass index (BMI) of 34.0 to 34.9 in adult, unspecified obesity type  PLAN:  Vitamin D Deficiency Kathy [name] was informed that low Vitamin D levels contributes to fatigue and are associated with obesity, breast, and colon cancer. She agrees to continue to take prescription Vit D @ 50,000 IU every week #4 with 0 refills and will follow-up for routine testing of  Vitamin D, at least 2-3 times per year. She was informed of the risk of over-replacement of Vitamin D and agrees to not increase her dose unless she discusses this with [place] first. Kathy [name] agrees to follow-up with our clinic in 2 weeks.  Hyperlipidemia Kathy [name] was informed of the American Heart Association Guidelines emphasizing intensive lifestyle modifications as the first line treatment for hyperlipidemia. We discussed many lifestyle modifications today in depth, and [name] will continue to work on decreasing saturated fats such as fatty red meat, butter and many fried foods. She will also increase vegetables and lean protein in her diet and continue to work on exercise and weight loss efforts.  Obesity Kathy [name] is currently in the action stage of change. As such, her goal is to continue with weight loss efforts. She has agreed to follow the Category 2 plan + 100 calories. Kathy [name] has been instructed to work up to a goal of 150 minutes of combined cardio and strengthening exercise per week for weight loss and overall health benefits. We discussed the following Behavioral Modification Strategies today: work on meal planning, easy cooking plans, and better snacking choices.  Kathy [name] has agreed to follow-up with our clinic in 2 weeks. She was informed of the importance of frequent follow-up visits to maximize her success with intensive lifestyle modifications for her multiple health conditions.  ALLERGIES: No Known Allergies  MEDICATIONS: Current Outpatient Medications on File Prior to Visit  Medication Sig Dispense Refill   acetaminophen (TYLENOL) 650 MG CR tablet Take 650 mg by mouth every 8 (eight) hours as  needed for pain.     ALPRAZolam (XANAX) 0.5 MG tablet Take 0.5 mg by mouth 2 (two) times daily as needed for anxiety.     metroNIDAZOLE (METROGEL) 0.75 % vaginal gel as needed.     Multiple Vitamins-Minerals (WOMENS ONE DAILY) TABS Take 1 tablet by mouth daily.     valACYclovir  (VALTREX) 500 MG tablet Take 500 mg by mouth daily as needed.      Vitamin D, Ergocalciferol, (DRISDOL) 1.25 MG (50000 UT) CAPS capsule Take 1 capsule (50,000 Units total) by mouth every 7 (seven) days. 4 capsule 0   No current facility-administered medications on file prior to visit.     PAST MEDICAL HISTORY: Past Medical History:  Diagnosis Date   Anxiety    Arthritis    Back pain    Chest pain    Dizziness    Fatigue    Generalized headaches    Hand tingling    Hip pain    HLD (hyperlipidemia)    HSV (herpes simplex virus) infection    Knee pain    Multiple food allergies    Avocado, Banana, [name], [name], sometimes apples:  Nausea, vomitting itching in throat   Palpitations    Postpartum care following vaginal delivery 10/05/2010    PAST SURGICAL HISTORY: Past Surgical History:  Procedure Laterality Date   NO PAST SURGERIES      SOCIAL HISTORY: Social History   Tobacco Use   Smoking status: Former Smoker    Packs/day: 1.00    Years: 10.00    Pack years: 10.00    Types: Cigarettes    Last attempt to quit: 2007    Years since quitting: 13.2   Smokeless tobacco: Never Used  Substance Use Topics   Alcohol use: Yes    Comment: rare on ocassion   Drug use: No    FAMILY HISTORY: Family History  Problem Relation Age of Onset   Lymphoma Mother    Breast cancer Maternal Grandmother    Diabetes Maternal Grandmother    Diabetes Maternal Grandfather    Cancer Maternal Grandfather        Unsure if Prostate or colon   Diabetes Paternal Grandmother    Diabetes Paternal Grandfather    Obesity Father    ROS: Review of Systems  Cardiovascular: Negative for chest pain.  Gastrointestinal: Negative for nausea and vomiting.  Musculoskeletal:       Negative for muscle weakness.   PHYSICAL EXAM: Pt in no acute distress  RECENT LABS AND TESTS: BMET    Component Value Date/Time   NA 138 03/01/2018 1120   K 4.7 03/01/2018 1120     CL 103 03/01/2018 1120   CO2 21 03/01/2018 1120   GLUCOSE 91 03/01/2018 1120   GLUCOSE 93 12/07/2017 1600   BUN 10 03/01/2018 1120   CREATININE 0.77 03/01/2018 1120   CREATININE 0.78 11/02/2014 1540   CALCIUM 9.3 03/01/2018 1120   GFRNONAA 97 03/01/2018 1120   GFRAA 112 03/01/2018 1120   Lab Results  Component Value Date   HGBA1C 5.4 03/01/2018   Lab Results  Component Value Date   INSULIN 18.8 03/01/2018   CBC    Component Value Date/Time   WBC 7.7 03/01/2018 1120   WBC 8.4 12/07/2017 1600   RBC 4.99 03/01/2018 1120   RBC 4.76 12/07/2017 1600   HGB 13.4 03/01/2018 1120   HCT 41.8 03/01/2018 1120   PLT 175.0 12/07/2017 1600   MCV 84 03/01/2018 1120   MCH 26.9  03/01/2018 1120   MCH 27.0 11/02/2014 1540   MCHC 32.1 03/01/2018 1120   MCHC 33.5 12/07/2017 1600   RDW 13.2 03/01/2018 1120   LYMPHSABS 2.3 03/01/2018 1120   MONOABS 0.6 12/07/2017 1600   EOSABS 0.1 03/01/2018 1120   BASOSABS 0.0 03/01/2018 1120   Iron/TIBC/Ferritin/ %Sat No results found for: IRON, TIBC, FERRITIN, IRONPCTSAT Lipid Panel     Component Value Date/Time   CHOL 162 03/01/2018 1120   TRIG 76 03/01/2018 1120   HDL 47 03/01/2018 1120   CHOLHDL 3 12/07/2017 1600   VLDL 27.8 12/07/2017 1600   LDLCALC 100 (H) 03/01/2018 1120   Hepatic Function Panel     Component Value Date/Time   PROT 6.9 03/01/2018 1120   ALBUMIN 4.5 03/01/2018 1120   AST 19 03/01/2018 1120   ALT 23 03/01/2018 1120   ALKPHOS 47 03/01/2018 1120   BILITOT 0.3 03/01/2018 1120      Component Value Date/Time   TSH 1.830 03/01/2018 1120   TSH 1.62 12/07/2017 1600   TSH 1.62 04/05/2017 1645   Results for Georgeanne NimCARTER, Annalina N (MRN [id number]) as of 05/10/2018 08:28  Ref. Range 03/01/2018 11:20  Vitamin D, 25-Hydroxy Latest Ref Range: 30.0 - 100.0 ng/mL 36.9   I, Marianna Paymentenise Haag, am acting as Energy managertranscriptionist for [employer] Desa Rech, PA-C I, Alois Clicheracey Ahmod Gillespie, PA-C have reviewed above note and agree with its content

## 2018-05-12 DIAGNOSIS — Z640 Problems related to unwanted pregnancy: Secondary | ICD-10-CM | POA: Diagnosis not present

## 2018-05-12 DIAGNOSIS — Z3201 Encounter for pregnancy test, result positive: Secondary | ICD-10-CM | POA: Diagnosis not present

## 2018-05-12 DIAGNOSIS — Z3687 Encounter for antenatal screening for uncertain dates: Secondary | ICD-10-CM | POA: Diagnosis not present

## 2018-05-19 NOTE — Progress Notes (Signed)
Office: [phone]  /  Fax: [phone]    Date: May 24, 2018   Appointment Start Time: 9:30am Duration: 25 minutes Provider: Lawerance CruelGaytri Adithi Gammon, Psy.D. Type of Session: Individual Therapy  Location of Patient: Home Location of Provider: Home  Type of Contact: Telepsychological Visit via [employer]   Session Content: Kathy [name] is a 43 y.o. female presenting via [employer] for a follow-up appointment to address the previously established treatment goal of decreasing emotional eating. Today's appointment was a telepsychological visit, as this provider's clinic is closed for in-person visits due to COVID-19. Therapeutic services will resume to in-person appointments once the clinic re-opens. Kathy [name] expressed understanding regarding the rationale for telepsychological services. Prior to proceeding with today's appointment, Kathy [name]'s physical location at the time of this appointment was obtained. Kathy [name] reported she was at home and provided the address. In the event of technical difficulties, Kathy [name] shared a phone number she could be reached at. Kathy [name] and this provider participated in today's telepsychological service. Also, Kathy [name] denied anyone else being present in the room or on the [employer] meeting.   This provider verbally administered the PHQ-9 and GAD-7, and conducted a brief check-in. She explained the endorsed symptomatology was secondary to ongoing stressors, including the pandemic and personal events (e.g., pregnancy). Kathy [name] reported, "I've not been following the program at all. I might even see about postponing it." This was explored. Kathy [name] noted, "It's just difficult to follow it when home." She acknowledged an increase in consumption of sweets, and she believes she has been baking more with her daughters due to boredom. Additionally, she explained she purchased various food items "that would last" in bulk; however, she noted they are processed foods. As such, today's session focused  predominately on increasing protein intake during each meal. This provider explored what protein options Kathy [name] had available in her home. She reported, she has yogurt, chicken, eggs, and beans. In addition to further discussing the importance of protein intake, a goal of including protein for each meal was established, and Kathy [name] was agreeable to the goal. Moreover, psychoeducation regarding the hunger and satisfaction scale was provided. This provider explained how it can be incorporated to help assist with Kathy [name]'s goal of increasing protein intake. This provider sent Kathy [name] a MyChart message with a handout for the hunger and satisfaction scale. Prior to sending the message, this provider explained the message would be visible to all providers, as it would be part of the electronic medical record. Kathy [name] verbally acknowledged understanding, and verbally consented to this provider sending the MyChart message. Kathy [name] was receptive to today's session as evidenced by openness to sharing, responsiveness to feedback, and willingness to implement discussed strategies.  Mental Status Examination:  Appearance: neat Behavior: cooperative Mood: euthymic Affect: mood congruent Speech: normal in rate, volume, and tone Eye Contact: appropriate Psychomotor Activity: appropriate Thought Process: linear, logical, and goal directed  Content/Perceptual Disturbances: denies suicidal and homicidal ideation, plan, and intent and no hallucinations, delusions, bizarre thinking or behavior reported or observed Orientation: time, person, place and purpose of appointment Cognition/Sensorium: memory, attention, language, and fund of knowledge intact  Insight: good Judgment: good  Structured Assessment Results: The Patient Health Questionnaire-9 (PHQ-9) is a self-report measure that assesses symptoms and severity of depression over the course of the last two weeks. Kathy [name] obtained a score of 2 suggesting minimal  depression. Kathy [name] finds the endorsed symptoms to be not difficult at all. Depression screen Kathy [name] 2/9 05/24/2018  Decreased Interest 0  Down, Depressed, Hopeless 1  PHQ -  2 Score 1  Altered sleeping 0  Tired, decreased energy 1  Change in appetite 0  Feeling bad or failure about yourself  0  Trouble concentrating 0  Moving slowly or fidgety/restless 0  Suicidal thoughts 0  PHQ-9 Score 2  Difficult doing work/chores -   The Generalized Anxiety Disorder-7 (GAD-7) is a brief self-report measure that assesses symptoms of anxiety over the course of the last two weeks. Kathy [name] obtained a score of 3 suggesting minimal anxiety. GAD 7 : Generalized Anxiety Score 05/24/2018  Nervous, Anxious, on Edge 0  Control/stop worrying 3  Worry too much - different things 0  Trouble relaxing 0  Restless 0  Easily annoyed or irritable 0  Afraid - awful might happen 0  Total GAD 7 Score 3  Anxiety Difficulty Somewhat difficult   Interventions:  Administration of PHQ-9 and GAD-7 for symptom monitoring Review of content from the previous session Empathic reflections and validation Problem solving Brief chart review Employed supportive psychotherapy interventions today to facilitate reduced distress, and to improve coping skills with identified stressors  DSM-5 Diagnosis: 311 (F32.8) Other Specified Depressive Disorder, Emotional Eating Behaviors   Treatment Goal & Progress: During the initial appointment with this provider, the following treatment goal was established: decrease emotional eating. Kathy [name] has demonstrated some progress in her goal as evidenced by increased awareness of hunger patterns, and triggers for emotional eating. While she acknowledged she has not been eating congruent the structured meal plan, she expressed willingness to implement discussed strategies.   Plan: Kathy [name] continues to appear able and willing to participate as evidenced by engagement in reciprocal conversation, and  asking questions for clarification as appropriate. The next appointment will be scheduled in two weeks, which will be via [employer]. Once this provider's office resumes in-person appointments, Kathy [name] will be notified. The next session will focus on the introduction of mindfulness, as it was not introduced today because the focus of the appointment was on Kathy [name]'s presenting concerns.

## 2018-05-24 ENCOUNTER — Ambulatory Visit (INDEPENDENT_AMBULATORY_CARE_PROVIDER_SITE_OTHER): Payer: BLUE CROSS/BLUE SHIELD | Admitting: Psychology

## 2018-05-24 ENCOUNTER — Other Ambulatory Visit: Payer: Self-pay

## 2018-05-24 ENCOUNTER — Ambulatory Visit (INDEPENDENT_AMBULATORY_CARE_PROVIDER_SITE_OTHER): Payer: Self-pay | Admitting: Physician Assistant

## 2018-05-24 ENCOUNTER — Encounter (INDEPENDENT_AMBULATORY_CARE_PROVIDER_SITE_OTHER): Payer: Self-pay

## 2018-05-24 DIAGNOSIS — Z640 Problems related to unwanted pregnancy: Secondary | ICD-10-CM | POA: Diagnosis not present

## 2018-05-24 DIAGNOSIS — F3289 Other specified depressive episodes: Secondary | ICD-10-CM

## 2018-05-25 ENCOUNTER — Ambulatory Visit (INDEPENDENT_AMBULATORY_CARE_PROVIDER_SITE_OTHER): Payer: BLUE CROSS/BLUE SHIELD | Admitting: Bariatrics

## 2018-05-25 ENCOUNTER — Encounter (INDEPENDENT_AMBULATORY_CARE_PROVIDER_SITE_OTHER): Payer: Self-pay | Admitting: Bariatrics

## 2018-05-25 ENCOUNTER — Other Ambulatory Visit: Payer: Self-pay

## 2018-05-25 DIAGNOSIS — E559 Vitamin D deficiency, unspecified: Secondary | ICD-10-CM

## 2018-05-25 DIAGNOSIS — F3289 Other specified depressive episodes: Secondary | ICD-10-CM

## 2018-05-25 DIAGNOSIS — Z6834 Body mass index (BMI) 34.0-34.9, adult: Secondary | ICD-10-CM

## 2018-05-25 DIAGNOSIS — E669 Obesity, unspecified: Secondary | ICD-10-CM

## 2018-05-25 MED ORDER — VITAMIN D (ERGOCALCIFEROL) 1.25 MG (50000 UNIT) PO CAPS: 50000.0000 [IU] | ORAL_CAPSULE | ORAL | 0 refills | Status: DC

## 2018-05-26 DIAGNOSIS — M19042 Primary osteoarthritis, left hand: Principal | ICD-10-CM

## 2018-05-26 DIAGNOSIS — M2550 Pain in unspecified joint: Secondary | ICD-10-CM | POA: Insufficient documentation

## 2018-05-26 DIAGNOSIS — M2240 Chondromalacia patellae, unspecified knee: Secondary | ICD-10-CM | POA: Insufficient documentation

## 2018-05-26 DIAGNOSIS — M19041 Primary osteoarthritis, right hand: Secondary | ICD-10-CM | POA: Insufficient documentation

## 2018-05-26 NOTE — Progress Notes (Signed)
Virtual Visit via Video Note  I connected with Kathy [name] on 06/01/18 at  2:00 PM EDT by a video enabled telemedicine application and verified that I am speaking with the correct person using two identifiers.   I discussed the limitations of evaluation and management by telemedicine and the availability of in person appointments. The patient expressed understanding and agreed to proceed. This service was conducted via virtual visit.  Both audio and visual tools were used.  The patient was located at home. I was located in my office.  Consent was obtained prior to the virtual visit and is aware of possible charges through their insurance for this visit.  The patient is an established patient.  Dr. Estanislado Pandy, MD conducted the virtual visit and Hazel Sams, PA-C acted as scribe during the service.  Office staff helped with scheduling follow up visits after the service was conducted.    CC: Discuss labs and x-rays   History of Present Illness: Patient is a 43 year old female with a past medical history of osteoarthritis and myalgias.  She has not had any morning stiffness recently.  She previously was experiencing stiffness and pain in her lower back.  She has some difficulty climbing steps due to discomfort in both knee joints.  She has no joint swelling.  She has no muscle weakness or tenderness at this time.  She has intermittent fatigue.   Review of Systems  Constitutional: Positive for malaise/fatigue. Negative for fever.  Eyes: Negative for photophobia, pain, discharge and redness.  Respiratory: Negative for cough, shortness of breath and wheezing.   Cardiovascular: Negative for chest pain and palpitations.  Gastrointestinal: Negative for blood in stool, constipation and diarrhea.  Genitourinary: Negative for dysuria.  Musculoskeletal: Positive for joint pain. Negative for back pain, myalgias and neck pain.  Skin: Negative for rash.  Neurological: Negative for dizziness and headaches.   Psychiatric/Behavioral: Negative for depression. The patient is nervous/anxious. The patient does not have insomnia.       Observations/Objective: Physical Exam  Constitutional: She is oriented to person, place, and time and well-developed, well-nourished, and in no distress.  HENT:  Head: Normocephalic and atraumatic.  Eyes: Conjunctivae are normal.  Pulmonary/Chest: Effort normal.  Neurological: She is alert and oriented to person, place, and time.  Psychiatric: Mood, memory, affect and judgment normal.   Patient reports morning stiffness for 0 minutes.   Patient denies nocturnal pain.  Difficulty dressing/grooming: Denies Difficulty climbing stairs: Reports Difficulty getting out of chair: Denies Difficulty using hands for taps, buttons, cutlery, and/or writing: Denies   Findings:  March 24, 2018 CK 110, ESR 6, RF negative, anti-CCP negative, '14 3 3 '$ eta negative, ANA negative, uric acid 5.5, HLA-B27 negative  Assessment and Plan: Primary osteoarthritis of both hands: Autoimmune work-up was negative, X-rays revealed findings consistent with mild OA: We reviewed lab work and x-rays obtained on 03/24/18 with the patient and all questions were addressed.  She has not joint pain or joint swelling at this time.  We discussed introducing natural antiinflammatories to her diet.  She was advised to notify us if she develops increased joint pain or joint swelling.  She will follow up PRN.   Chondromalacia of patella, bilateral: Mild-She has chronic pain in both knee joints.  She has no joint swelling. She has some difficulty climbing steps. A prescription for voltaren gel will be sent to the pharmacy.  Potential side effects and instructions were reviewed with the patient.  We also discussed natural antiinflammatories  which were discussed at length.  Chronic midline low back pain without sciatica: X-rays were unremarkable on 03/24/18.  She was previously experiencing lower back pain and  stiffness in the morning but her symptoms have resolved.   Hypermobility arthralgia: We discussed that patient's with hypermobility have an increased likelihood of developing osteoarthritis and experiencing myalgias and arthralgias.  We discussed the importance of exercising on a regular basis.  Precautions were discussed-avoid hyperextension.  We dicussed the importance of joint protection and muscle strengthening.   We recommended low impact exercise. We discussed the importance of maintaining ideal weight to prevent arthralgias.    Myalgia: CK was normal-110 on 03/24/18.  She has no muscle weakness.  We discussed stretching on a regular basis.   Vitamin D deficiency: She is taking vitamin D 50,000 units by mouth once weekly.   Family history of systemic lupus erythematosus: She has no clinical features of autoimmune disease at this time.    Follow Up Instructions: She will follow up PRN.    I discussed the assessment and treatment plan with the patient. The patient was provided an opportunity to ask questions and all were answered. The patient agreed with the plan and demonstrated an understanding of the instructions.   The patient was advised to call back or seek an in-person evaluation if the symptoms worsen or if the condition fails to improve as anticipated.  I provided 30 minutes of non-face-to-face time during this encounter.  Bo Merino, MD   Scribed by-  Ofilia Neas, PA-C

## 2018-05-26 NOTE — Progress Notes (Signed)
Office: [phone]  /  Fax: [phone] TeleHealth Visit:  Kathy [name] has verbally consented to this TeleHealth visit today. The patient is located at home, the provider is located at the [facility] office. The participants in this visit include the listed provider and patient and any and all parties involved. The visit was conducted today via WebEx.  HPI:   Chief Complaint: OBESITY Kathy [name] is here to discuss her progress with her obesity treatment plan. She is on the Category 2 plan +100 calories and is following her eating plan approximately 0 % of the time. She states she is walking 30 minutes 2 times per week. Kathy [name] states that she has remained the same since her last visit. She is working from home. Kathy [name] has not been following the plan. We were unable to weigh the patient today for this TeleHealth visit. She feels as if she has maintained weight since her last visit. She has lost 12 lbs since starting treatment with [place].  Vitamin D deficiency Kathy [name] has a diagnosis of vitamin D deficiency. She is currently taking vit D and denies nausea, vomiting or muscle weakness.  Depression with emotional eating behaviors Kathy [name] is seeing Kathy [name] (bariatric psychologist). She struggles with emotional eating and using food for comfort to the extent that it is negatively impacting her health. She often snacks when she is not hungry. Kathy [name] sometimes feels she is out of control and then feels guilty that she made poor food choices. She has been working on behavior modification techniques to help reduce her emotional eating and has been somewhat successful. She shows no sign of suicidal or homicidal ideations.  Depression screen Kathy [name] 2/9 05/24/2018 05/09/2018 04/25/2018 04/04/2018 03/01/2018  Decreased Interest 0 1 0 1 1  Down, Depressed, Hopeless 1 1 1 1 2   PHQ - 2 Score 1 2 1 2 3   Altered sleeping 0 0 0 0 0  Tired, decreased energy 1 0 1 3 2   Change in appetite 0 0 1 0 3   Feeling bad or failure about yourself  0 0 0 1 1  Trouble concentrating 0 0 1 0 0  Moving slowly or fidgety/restless 0 0 0 0 0  Suicidal thoughts 0 0 0 0 0  PHQ-9 Score 2 2 4 6 9   Difficult doing work/chores - - - - Not difficult at all    ASSESSMENT AND PLAN:  Vitamin D deficiency - Plan: Vitamin D, Ergocalciferol, (DRISDOL) 1.25 MG (50000 UT) CAPS capsule  Other depression - with emotional eating  Class 1 obesity with serious comorbidity and body mass index (BMI) of 34.0 to 34.9 in adult, unspecified obesity type  PLAN:  Vitamin D Deficiency Kathy [name] was informed that low vitamin D levels contributes to fatigue and are associated with obesity, breast, and colon cancer. She agrees to continue to take prescription Vit D @50 ,000 IU every week #4 with no refills and will follow up for routine testing of vitamin D, at least 2-3 times per year. She was informed of the risk of over-replacement of vitamin D and agrees to not increase her dose unless she discusses this with [place] first. Kathy [name] agreed to follow up as directed.  Depression with Emotional Eating Behaviors We discussed behavior modification techniques today to help Kathy [name] deal with her emotional eating and depression. Kathy [name] will continue to see Kathy [name] and follow up as directed.  Obesity Kathy [name] is currently in the action stage of change. As such, her goal is to continue  with weight loss efforts She has agreed to follow the Category 2 plan Kathy [name] will continue to walk for weight loss and overall health benefits. We discussed the following Behavioral Modification Strategies today: planning for success, increase H2O intake, keeping healthy foods in the home, better snacking choices, increasing lean protein intake, decreasing simple carbohydrates, increasing vegetables, work on meal planning and easy cooking plans, ways to avoid boredom eating, ways to avoid night time snacking and avoiding temptations Kathy [name] will weigh herself  at home before each visit. She will prepare a lunchbox. Kathy [name] will portion control.  Kathy [name] has agreed to follow up with our clinic in 2 weeks. She was informed of the importance of frequent follow up visits to maximize her success with intensive lifestyle modifications for her multiple health conditions.  ALLERGIES: No Known Allergies  MEDICATIONS: Current Outpatient Medications on File Prior to Visit  Medication Sig Dispense Refill  . acetaminophen (TYLENOL) 650 MG CR tablet Take 650 mg by mouth every 8 (eight) hours as needed for pain.    Marland Kitchen ALPRAZolam (XANAX) 0.5 MG tablet Take 0.5 mg by mouth 2 (two) times daily as needed for anxiety.    . metroNIDAZOLE (METROGEL) 0.75 % vaginal gel as needed.    . Multiple Vitamins-Minerals (WOMENS ONE DAILY) TABS Take 1 tablet by mouth daily.    . valACYclovir (VALTREX) 500 MG tablet Take 500 mg by mouth daily as needed.      No current facility-administered medications on file prior to visit.     PAST MEDICAL HISTORY: Past Medical History:  Diagnosis Date  . Anxiety   . Arthritis   . Back pain   . Chest pain   . Dizziness   . Fatigue   . Generalized headaches   . Hand tingling   . Hip pain   . HLD (hyperlipidemia)   . HSV (herpes simplex virus) infection   . Knee pain   . Multiple food allergies    Avocado, Banana, [name], [name], sometimes apples:  Nausea, vomitting itching in throat  . Palpitations   . Postpartum care following vaginal delivery 10/05/2010    PAST SURGICAL HISTORY: Past Surgical History:  Procedure Laterality Date  . NO PAST SURGERIES      SOCIAL HISTORY: Social History   Tobacco Use  . Smoking status: Former Smoker    Packs/day: 1.00    Years: 10.00    Pack years: 10.00    Types: Cigarettes    Last attempt to quit: 2007    Years since quitting: 13.3  . Smokeless tobacco: Never Used  Substance Use Topics  . Alcohol use: Yes    Comment: rare on ocassion  . Drug use: No    FAMILY HISTORY:  Family History  Problem Relation Age of Onset  . Lymphoma Mother   . Breast cancer Maternal Grandmother   . Diabetes Maternal Grandmother   . Diabetes Maternal Grandfather   . Cancer Maternal Grandfather        Unsure if Prostate or colon  . Diabetes Paternal Grandmother   . Diabetes Paternal Grandfather   . Obesity Father     ROS: Review of Systems  Constitutional: Negative for weight loss.  Gastrointestinal: Negative for nausea and vomiting.  Musculoskeletal:       Negative for muscle weakness  Psychiatric/Behavioral: Positive for depression. Negative for suicidal ideas.    PHYSICAL EXAM: Pt in no acute distress  RECENT LABS AND TESTS: BMET    Component Value Date/Time   NA 138  03/01/2018 1120   K 4.7 03/01/2018 1120   CL 103 03/01/2018 1120   CO2 21 03/01/2018 1120   GLUCOSE 91 03/01/2018 1120   GLUCOSE 93 12/07/2017 1600   BUN 10 03/01/2018 1120   CREATININE 0.77 03/01/2018 1120   CREATININE 0.78 11/02/2014 1540   CALCIUM 9.3 03/01/2018 1120   GFRNONAA 97 03/01/2018 1120   GFRAA 112 03/01/2018 1120   Lab Results  Component Value Date   HGBA1C 5.4 03/01/2018   Lab Results  Component Value Date   INSULIN 18.8 03/01/2018   CBC    Component Value Date/Time   WBC 7.7 03/01/2018 1120   WBC 8.4 12/07/2017 1600   RBC 4.99 03/01/2018 1120   RBC 4.76 12/07/2017 1600   HGB 13.4 03/01/2018 1120   HCT 41.8 03/01/2018 1120   PLT 175.0 12/07/2017 1600   MCV 84 03/01/2018 1120   MCH 26.9 03/01/2018 1120   MCH 27.0 11/02/2014 1540   MCHC 32.1 03/01/2018 1120   MCHC 33.5 12/07/2017 1600   RDW 13.2 03/01/2018 1120   LYMPHSABS 2.3 03/01/2018 1120   MONOABS 0.6 12/07/2017 1600   EOSABS 0.1 03/01/2018 1120   BASOSABS 0.0 03/01/2018 1120   Iron/TIBC/Ferritin/ %Sat No results found for: IRON, TIBC, FERRITIN, IRONPCTSAT Lipid Panel     Component Value Date/Time   CHOL 162 03/01/2018 1120   TRIG 76 03/01/2018 1120   HDL 47 03/01/2018 1120   CHOLHDL 3  12/07/2017 1600   VLDL 27.8 12/07/2017 1600   LDLCALC 100 (H) 03/01/2018 1120   Hepatic Function Panel     Component Value Date/Time   PROT 6.9 03/01/2018 1120   ALBUMIN 4.5 03/01/2018 1120   AST 19 03/01/2018 1120   ALT 23 03/01/2018 1120   ALKPHOS 47 03/01/2018 1120   BILITOT 0.3 03/01/2018 1120      Component Value Date/Time   TSH 1.830 03/01/2018 1120   TSH 1.62 12/07/2017 1600   TSH 1.62 04/05/2017 1645     Ref. Range 03/01/2018 11:20  Vitamin D, 25-Hydroxy Latest Ref Range: 30.0 - 100.0 ng/mL 36.9    I, [place] CraneJoanne Murray, am acting as Energy managertranscriptionist for [place] Corporationngel A. Manson PasseyBrown, DO  I have reviewed the above documentation for accuracy and completeness, and I agree with the above. -Corinna CapraAngel Kurt Azimi, DO

## 2018-05-30 DIAGNOSIS — E66813 Obesity, class 3: Secondary | ICD-10-CM | POA: Insufficient documentation

## 2018-05-30 DIAGNOSIS — Z6834 Body mass index (BMI) 34.0-34.9, adult: Secondary | ICD-10-CM

## 2018-05-30 DIAGNOSIS — E669 Obesity, unspecified: Secondary | ICD-10-CM | POA: Insufficient documentation

## 2018-06-01 ENCOUNTER — Other Ambulatory Visit: Payer: Self-pay

## 2018-06-01 ENCOUNTER — Telehealth: Payer: Self-pay | Admitting: *Deleted

## 2018-06-01 ENCOUNTER — Telehealth (INDEPENDENT_AMBULATORY_CARE_PROVIDER_SITE_OTHER): Payer: BLUE CROSS/BLUE SHIELD | Admitting: Rheumatology

## 2018-06-01 ENCOUNTER — Encounter: Payer: Self-pay | Admitting: Rheumatology

## 2018-06-01 DIAGNOSIS — G8929 Other chronic pain: Secondary | ICD-10-CM

## 2018-06-01 DIAGNOSIS — Z8639 Personal history of other endocrine, nutritional and metabolic disease: Secondary | ICD-10-CM

## 2018-06-01 DIAGNOSIS — M19042 Primary osteoarthritis, left hand: Secondary | ICD-10-CM | POA: Diagnosis not present

## 2018-06-01 DIAGNOSIS — M2550 Pain in unspecified joint: Secondary | ICD-10-CM

## 2018-06-01 DIAGNOSIS — Z8659 Personal history of other mental and behavioral disorders: Secondary | ICD-10-CM

## 2018-06-01 DIAGNOSIS — M545 Low back pain, unspecified: Secondary | ICD-10-CM

## 2018-06-01 DIAGNOSIS — Z8269 Family history of other diseases of the musculoskeletal system and connective tissue: Secondary | ICD-10-CM

## 2018-06-01 DIAGNOSIS — M19041 Primary osteoarthritis, right hand: Secondary | ICD-10-CM | POA: Diagnosis not present

## 2018-06-01 DIAGNOSIS — R768 Other specified abnormal immunological findings in serum: Secondary | ICD-10-CM

## 2018-06-01 DIAGNOSIS — E559 Vitamin D deficiency, unspecified: Secondary | ICD-10-CM

## 2018-06-01 DIAGNOSIS — M2240 Chondromalacia patellae, unspecified knee: Secondary | ICD-10-CM

## 2018-06-01 DIAGNOSIS — M791 Myalgia, unspecified site: Secondary | ICD-10-CM

## 2018-06-01 MED ORDER — DICLOFENAC SODIUM 1 % TD GEL: TRANSDERMAL | 2 refills | Status: DC

## 2018-06-01 NOTE — Telephone Encounter (Signed)
Received a Prior Authorization request from [facility] for Volatren Gel. Authorization has been submitted to patient's insurance via Cover My Meds. Will update once we receive a response.

## 2018-06-03 ENCOUNTER — Telehealth: Payer: Self-pay | Admitting: *Deleted

## 2018-06-03 NOTE — Telephone Encounter (Signed)
Patient had a telemedicine visit and per the visit patient to follow up PRN.

## 2018-06-07 NOTE — Progress Notes (Signed)
Office: [phone]  /  Fax: [phone]    Date: Jun 08, 2018   Appointment Start Time: 9:34am Duration: 32 minutes Provider: Lawerance Cruel, Psy.D. Type of Session: Individual Therapy  Location of Patient: Home-bedroom Location of Provider: Provider's Home Type of Contact: Telepsychological Visit via [employer] WebEx   Session Content: Kathy [name] is a 43 y.o. female presenting via [employer] WebEx for a follow-up appointment to address the previously established treatment goal of decreasing emotional eating. Today's appointment was a telepsychological visit, as this provider's clinic is closed for in-person visits due to COVID-19. Therapeutic services will resume to in-person appointments once the clinic re-opens. Kathy [name] expressed understanding regarding the rationale for telepsychological services, and provided verbal consent for today's appointment. Prior to proceeding with today's appointment, Kathy [name]'s physical location at the time of this appointment was obtained. Kathy [name] reported she was at home in her bedroom and provided the address. In the event of technical difficulties, Kathy [name] shared a phone number she could be reached at. Kathy [name] and this provider participated in today's telepsychological service. Also, Kathy [name] denied anyone else being present in the room or on the WebEx appointment.  Notably, this provider called Kathy [name] at 9:32am as she did not present for today's WebEx appointment. Kathy [name] shared she could not find the e-mail with the link for today's appointment; therefore, the e-mail was re-sent. As such, today's appointment was initiated four minutes late.   This provider conducted a brief check-in and verbally administered the PHQ-9 and GAD-7. Kathy [name] shared terminating her pregnancy, and noted, "It was tough so I wasn't focusing on eating." Nevertheless, she shared an increase in eating congruent to the structured meal plan (I.e., 75%) since the last appointment with this provider.  Since the termination of pregnancy, Kathy [name] discussed she is feeling "better" physically, but occasionally "breaks down." She explained the endorsed symptoms on the PHQ-9 and GAD-7 were secondary to the termination of pregnancy. Today's appointment focused on mindfulness to assist with coping with emotional eating and current stressors. Psychoeducation regarding mindfulness was provided. A handout was provided to Kathy [name] via e-mail with further information regarding mindfulness, including exercises. This provider also explained the benefit of mindfulness as it relates to emotional eating and overall well-being. Kathy [name] was encouraged to engage in the provided exercises between now and the next appointment with this provider on a daily basis, especially when triggered to emotionally eat. Kathy [name] agreed. During today's appointment, Kathy [name] was led through a mindfulness exercise involving her senses to assist with being in the present moment. Furthermore, this provider and Kathy [name] discussed termination planning. At the onset of therapeutic services the duration of treatment was discussed to be a total of 4 to 6 sessions; however, due to the pandemic, this provider will continue meeting with Kathy [name] as deemed necessary and appropriate. Kathy [name] verbally acknowledged understanding of the aforementioned, and she was receptive to Kathy [name] benefits. Overall, Kathy [name] was receptive to today's session as evidenced by openness to sharing, responsiveness to feedback, and willingness to engage in mindfulness exercises.  Mental Status Examination:  Appearance: neat Behavior: cooperative Mood: euthymic Affect: mood congruent Speech: normal in rate, volume, and tone Eye Contact: appropriate Psychomotor Activity: appropriate Thought Process: linear, logical, and goal directed  Content/Perceptual Disturbances: denies suicidal and homicidal ideation, plan, and intent and no hallucinations, delusions, bizarre  thinking or behavior reported or observed Orientation: time, person, place and purpose of appointment Cognition/Sensorium: memory, attention, language, and fund of knowledge intact  Insight: good Judgment: good  Structured Assessment Results: The Patient Health Questionnaire-9 (  PHQ-9) is a self-report measure that assesses symptoms and severity of depression over the course of the last two weeks. Kathy [name] obtained a score of 5 suggesting mild depression. Kathy [name] finds the endorsed symptoms to be not difficult at all. Depression screen PHQ 2/9 06/08/2018  Decreased Interest 1  Down, Depressed, Hopeless 2  PHQ - 2 Score 3  Altered sleeping 1  Tired, decreased energy 0  Change in appetite 0  Feeling bad or failure about yourself  0  Trouble concentrating 1  Moving slowly or fidgety/restless 0  Suicidal thoughts 0  PHQ-9 Score 5  Difficult doing work/chores -   The Generalized Anxiety Disorder-7 (GAD-7) is a brief self-report measure that assesses symptoms of anxiety over the course of the last two weeks. Kathy [name] obtained a score of 7 suggesting mild anxiety. GAD 7 : Generalized Anxiety Score 06/08/2018  Nervous, Anxious, on Edge 1  Control/stop worrying 3  Worry too much - different things 3  Trouble relaxing 0  Restless 0  Easily annoyed or irritable 0  Afraid - awful might happen 0  Total GAD 7 Score 7  Anxiety Difficulty Not difficult at all   Interventions:  Administered PHQ-9 and GAD-7 for symptom monitoring Reviewed content from the previous session Provided empathic reflections and validation Psychoeducation provided regarding mindfulness Engaged patient in a mindfulness exercise Discussed termination planning Provided positive reinforcement Employed acceptance and commitment interventions to emphasize mindfulness and acceptance without struggle Conducted a brief chart review  DSM-5 Diagnosis: 311 (F32.8) Other Specified Depressive Disorder, Emotional Eating Behaviors   Treatment Goal & Progress: During the initial appointment with this provider, the following treatment goal was established: decrease emotional eating. Kathy [name] has demonstrated progress in her goal as evidenced by increased awareness of hunger patterns and triggers for emotional eating. Since the onset of the pandemic, Kathy [name] discussed difficulty eating congruent to the structured meal plan and an increase in emotional eating; however, during today's appointment, Kathy [name] discussed eating congruent to the meal plan 75% of the time. She also demonstrated willingness to engage in mindfulness exercises to assist with coping.   Plan: Kathy [name] continues to appear able and willing to participate as evidenced by engagement in reciprocal conversation, and asking questions for clarification as appropriate. The next appointment will be scheduled in two weeks, which will be via [employer] WebEx. Once this provider's office resumes in-person appointments, Kathy [name] will be notified. The next session will focus further on mindfulness.

## 2018-06-08 ENCOUNTER — Other Ambulatory Visit: Payer: Self-pay

## 2018-06-08 ENCOUNTER — Ambulatory Visit (INDEPENDENT_AMBULATORY_CARE_PROVIDER_SITE_OTHER): Payer: Self-pay | Admitting: Bariatrics

## 2018-06-08 ENCOUNTER — Ambulatory Visit (INDEPENDENT_AMBULATORY_CARE_PROVIDER_SITE_OTHER): Payer: BLUE CROSS/BLUE SHIELD | Admitting: Psychology

## 2018-06-08 DIAGNOSIS — F3289 Other specified depressive episodes: Secondary | ICD-10-CM

## 2018-06-08 DIAGNOSIS — Z640 Problems related to unwanted pregnancy: Secondary | ICD-10-CM | POA: Diagnosis not present

## 2018-06-09 ENCOUNTER — Other Ambulatory Visit: Payer: Self-pay

## 2018-06-09 ENCOUNTER — Encounter (INDEPENDENT_AMBULATORY_CARE_PROVIDER_SITE_OTHER): Payer: Self-pay | Admitting: Bariatrics

## 2018-06-09 ENCOUNTER — Ambulatory Visit (INDEPENDENT_AMBULATORY_CARE_PROVIDER_SITE_OTHER): Payer: BLUE CROSS/BLUE SHIELD | Admitting: Bariatrics

## 2018-06-09 DIAGNOSIS — Z6834 Body mass index (BMI) 34.0-34.9, adult: Secondary | ICD-10-CM | POA: Diagnosis not present

## 2018-06-09 DIAGNOSIS — E559 Vitamin D deficiency, unspecified: Secondary | ICD-10-CM | POA: Diagnosis not present

## 2018-06-09 DIAGNOSIS — E669 Obesity, unspecified: Secondary | ICD-10-CM | POA: Diagnosis not present

## 2018-06-09 DIAGNOSIS — F3289 Other specified depressive episodes: Secondary | ICD-10-CM | POA: Diagnosis not present

## 2018-06-13 NOTE — Progress Notes (Signed)
Office: [phone]  /  Fax: [phone] TeleHealth Visit:  Georgeanne NimJanelle N [name] has verbally consented to this TeleHealth visit today. The patient is located at home, the provider is located at the [facility] office. The participants in this visit include the listed provider and patient and any and all parties involved. The visit was conducted today via WebEx.  HPI:   Chief Complaint: OBESITY Kathy [name] is here to discuss her progress with her obesity treatment plan. She is on the Category 2 plan and is following her eating plan approximately 75 % of the time. She states she is walking for 30 to 35 minutes 2 times per week. Kathy [name] states that her weight has remained the same. Kathy [name] is doing better, and she has been focusing more over the last week. We were unable to weigh the patient today for this TeleHealth visit. She feels as if she has maintained weight since her last visit. She has lost 7 lbs since starting treatment with us.  Vitamin D deficiency Kathy [name] has a diagnosis of vitamin D deficiency. She is taking high dose vit D and denies nausea, vomiting or muscle weakness.  Depression with emotional eating behaviors Ruble struggles with emotional eating and using food for comfort to the extent that it is negatively impacting her health. She often snacks when she is not hungry. Ramsey sometimes feels she is out of control and then feels guilty that she made poor food choices. She is seeing Dr. [name] (bariatric psychologist). She has been working on behavior modification techniques to help reduce her emotional eating and has been somewhat successful. She shows no sign of suicidal or homicidal ideations.  Depression screen [facility] 06/08/2018 05/24/2018 05/09/2018 04/25/2018 04/04/2018  Decreased Interest 1 0 1 0 1  Down, Depressed, Hopeless 2 1 1 1 1   PHQ - 2 Score 3 1 2 1 2   Altered sleeping 1 0 0 0 0  Tired, decreased energy 0 1 0 1 3  Change in appetite 0 0 0 1 0  Feeling  bad or failure about yourself  0 0 0 0 1  Trouble concentrating 1 0 0 1 0  Moving slowly or fidgety/restless 0 0 0 0 0  Suicidal thoughts 0 0 0 0 0  PHQ-9 Score 5 2 2 4 6   Difficult doing work/chores - - - - -    ASSESSMENT AND PLAN:  Vitamin D deficiency  Other depression - with emotional eating  Class 1 obesity with serious comorbidity and body mass index (BMI) of 34.0 to 34.9 in adult, unspecified obesity type  PLAN:  Vitamin D Deficiency Avereigh was informed that low vitamin D levels contributes to fatigue and are associated with obesity, breast, and colon cancer. She will continue to take prescription Vit D @50 ,000 IU every week and will follow up for routine testing of vitamin D, at least 2-3 times per year. She was informed of the risk of over-replacement of vitamin D and agrees to not increase her dose unless she discusses this with us first.  Depression with Emotional Eating Behaviors We reviewed behavior modification techniques today to help Kathy [name] deal with her emotional eating and depression. She will continue to see Dr. [name] and follow up as directed.  Obesity Kathy [name] is currently in the action stage of change. As such, her goal is to continue with weight loss efforts She has agreed to follow the Category 2 plan Kathy [name] will continue her exercise regimen for weight loss and overall health benefits.  We discussed the following Behavioral Modification Strategies today: increase H2O intake, no skipping meals, keeping healthy foods in the home, increasing lean protein intake, decreasing simple carbohydrates, increasing vegetables, decrease eating out and work on meal planning and easy cooking plans [name] will weigh herself at home before each visit. We discussed protein substitutions.  [name] has agreed to follow up with our clinic in 2 weeks. She was informed of the importance of frequent follow up visits to maximize her success with intensive lifestyle modifications  for her multiple health conditions.  ALLERGIES: No Known Allergies  MEDICATIONS: Current Outpatient Medications on File Prior to Visit  Medication Sig Dispense Refill  . acetaminophen (TYLENOL) 650 MG CR tablet Take 650 mg by mouth every 8 (eight) hours as needed for pain.    Marland Kitchen ALPRAZolam (XANAX) 0.5 MG tablet Take 0.5 mg by mouth 2 (two) times daily as needed for anxiety.    . diclofenac sodium (VOLTAREN) 1 % GEL Apply 2 grams to 4 grams to affected area up to 4 times daily as needed. 4 Tube 2  . metroNIDAZOLE (METROGEL) 0.75 % vaginal gel as needed.    . Multiple Vitamins-Minerals (WOMENS ONE DAILY) TABS Take 1 tablet by mouth daily.    . valACYclovir (VALTREX) 500 MG tablet Take 500 mg by mouth daily as needed.     . Vitamin D, Ergocalciferol, (DRISDOL) 1.25 MG (50000 UT) CAPS capsule Take 1 capsule (50,000 Units total) by mouth every 7 (seven) days. 4 capsule 0   No current facility-administered medications on file prior to visit.     PAST MEDICAL HISTORY: Past Medical History:  Diagnosis Date  . Anxiety   . Arthritis   . Back pain   . Chest pain   . Dizziness   . Fatigue   . Generalized headaches   . Hand tingling   . Hip pain   . HLD (hyperlipidemia)   . HSV (herpes simplex virus) infection   . Knee pain   . Multiple food allergies    Avocado, Banana, [name], [name], sometimes apples:  Nausea, vomitting itching in throat  . Palpitations   . Postpartum care following vaginal delivery 10/05/2010    PAST SURGICAL HISTORY: Past Surgical History:  Procedure Laterality Date  . NO PAST SURGERIES      SOCIAL HISTORY: Social History   Tobacco Use  . Smoking status: Former Smoker    Packs/day: 1.00    Years: 10.00    Pack years: 10.00    Types: Cigarettes    Last attempt to quit: 2007    Years since quitting: 13.3  . Smokeless tobacco: Never Used  Substance Use Topics  . Alcohol use: Yes    Comment: rare on ocassion  . Drug use: No    FAMILY HISTORY:  Family History  Problem Relation Age of Onset  . Lymphoma Mother   . Breast cancer Maternal Grandmother   . Diabetes Maternal Grandmother   . Diabetes Maternal Grandfather   . Cancer Maternal Grandfather        Unsure if Prostate or colon  . Diabetes Paternal Grandmother   . Diabetes Paternal Grandfather   . Obesity Father     ROS: Review of Systems  Constitutional: Negative for weight loss.  Gastrointestinal: Negative for nausea and vomiting.  Musculoskeletal:       Negative for muscle weakness  Psychiatric/Behavioral: Positive for depression. Negative for suicidal ideas.    PHYSICAL EXAM: Pt in no acute distress  RECENT LABS AND TESTS:  BMET    Component Value Date/Time   NA 138 03/01/2018 1120   K 4.7 03/01/2018 1120   CL 103 03/01/2018 1120   CO2 21 03/01/2018 1120   GLUCOSE 91 03/01/2018 1120   GLUCOSE 93 12/07/2017 1600   BUN 10 03/01/2018 1120   CREATININE 0.77 03/01/2018 1120   CREATININE 0.78 11/02/2014 1540   CALCIUM 9.3 03/01/2018 1120   GFRNONAA 97 03/01/2018 1120   GFRAA 112 03/01/2018 1120   Lab Results  Component Value Date   HGBA1C 5.4 03/01/2018   Lab Results  Component Value Date   INSULIN 18.8 03/01/2018   CBC    Component Value Date/Time   WBC 7.7 03/01/2018 1120   WBC 8.4 12/07/2017 1600   RBC 4.99 03/01/2018 1120   RBC 4.76 12/07/2017 1600   HGB 13.4 03/01/2018 1120   HCT 41.8 03/01/2018 1120   PLT 175.0 12/07/2017 1600   MCV 84 03/01/2018 1120   MCH 26.9 03/01/2018 1120   MCH 27.0 11/02/2014 1540   MCHC 32.1 03/01/2018 1120   MCHC 33.5 12/07/2017 1600   RDW 13.2 03/01/2018 1120   LYMPHSABS 2.3 03/01/2018 1120   MONOABS 0.6 12/07/2017 1600   EOSABS 0.1 03/01/2018 1120   BASOSABS 0.0 03/01/2018 1120   Iron/TIBC/Ferritin/ %Sat No results found for: IRON, TIBC, FERRITIN, IRONPCTSAT Lipid Panel     Component Value Date/Time   CHOL 162 03/01/2018 1120   TRIG 76 03/01/2018 1120   HDL 47 03/01/2018 1120   CHOLHDL 3  12/07/2017 1600   VLDL 27.8 12/07/2017 1600   LDLCALC 100 (H) 03/01/2018 1120   Hepatic Function Panel     Component Value Date/Time   PROT 6.9 03/01/2018 1120   ALBUMIN 4.5 03/01/2018 1120   AST 19 03/01/2018 1120   ALT 23 03/01/2018 1120   ALKPHOS 47 03/01/2018 1120   BILITOT 0.3 03/01/2018 1120      Component Value Date/Time   TSH 1.830 03/01/2018 1120   TSH 1.62 12/07/2017 1600   TSH 1.62 04/05/2017 1645     Ref. Range 03/01/2018 11:20  Vitamin D, 25-Hydroxy Latest Ref Range: 30.0 - 100.0 ng/mL 36.9    I, [place] Crane, am acting as Energy manager for [employer]. Manson Passey, DO  I have reviewed the above documentation for accuracy and completeness, and I agree with the above. -Corinna Capra, DO

## 2018-06-15 DIAGNOSIS — O074 Failed attempted termination of pregnancy without complication: Secondary | ICD-10-CM | POA: Diagnosis not present

## 2018-06-16 DIAGNOSIS — O021 Missed abortion: Secondary | ICD-10-CM | POA: Diagnosis not present

## 2018-06-16 DIAGNOSIS — O034 Incomplete spontaneous abortion without complication: Secondary | ICD-10-CM | POA: Diagnosis not present

## 2018-06-16 DIAGNOSIS — O074 Failed attempted termination of pregnancy without complication: Secondary | ICD-10-CM | POA: Diagnosis not present

## 2018-06-22 ENCOUNTER — Other Ambulatory Visit: Payer: Self-pay

## 2018-06-22 ENCOUNTER — Ambulatory Visit (INDEPENDENT_AMBULATORY_CARE_PROVIDER_SITE_OTHER): Payer: BLUE CROSS/BLUE SHIELD | Admitting: Psychology

## 2018-06-22 DIAGNOSIS — F3289 Other specified depressive episodes: Secondary | ICD-10-CM | POA: Diagnosis not present

## 2018-06-22 NOTE — Progress Notes (Signed)
Office: [phone]  /  Fax: [phone]    Date: Jun 22, 2018    Appointment Start Time: 9:32am Duration: 33 minutes Provider: Lawerance Cruel, Psy.D. Type of Session: Individual Therapy  Location of Patient: Home Location of Provider: Provider's Home Type of Contact: Telepsychological Visit via [employer] WebEx   Session Content: Kathy [name] is a 43 y.o. female presenting via [employer] WebEx for a follow-up appointment to address the previously established treatment goal of decreasing emotional eating. Today's appointment was a telepsychological visit, as this provider's clinic is closed for in-person visits due to COVID-19. Therapeutic services will resume to in-person appointments once the clinic re-opens. Kathy [name] expressed understanding regarding the rationale for telepsychological services, and provided verbal consent for today's appointment. Prior to proceeding with today's appointment, Kathy [name]'s physical location at the time of this appointment was obtained. Kathy [name] reported she was at home and provided the address. In the event of technical difficulties, Kathy [name] shared a phone number she could be reached at. Kathy [name] and this provider participated in today's telepsychological service. Also, Kathy [name] denied anyone else being present in the room or on the WebEx appointment.  This provider conducted a brief check-in and verbally administered the PHQ-9 and GAD-7. Kathy [name] reported since the last appointment with this provider, she had to have a procedure for the pregnancy she previously discussed. She indicated it was "emotional," but she is okay now. Additionally, she discussed applying for a new position at her job as she did not pass a required test. Additionally, Kathy [name] stated, "I was trying to remember the five senses in certain cases, instead I just focused on the breathing." Thus, this provider gave her strategies and alternatives for the senses exercise. She further discussed her mindfulness practice  was predominately informal in the past few weeks. Thus, this provider discussed formal mindfulness practice and encouraged her to schedule time every day to engage in it; [place] agreed. Regarding eating, Kathy [name] stated, "I'm still not back to following it [referring to the structured meal plan] a 100%. I'd say 50-75%." She indicated mornings are easier, but lunches are a challenge depending on what is available. Nevertheless, she discussed trying to make better choices by focusing on protein and vegetable intake. She explained dinner "ends up being whatever." This provider assisted Kathy [name] in setting a goal to increase protein intake by 25%. She further discussed while she is not a vegetarian, the meal has "too much meat." Thus, it was recommended she discuss the aforementioned with Dr. Manson Passey; Kathy [name] agreed. Remainder of session focused on assisting Kathy [name] further with mindfulness. Given that she described focusing on her breathing was helpful, she was led through a breathing mindfulness exercise. Following the exercise, Kathy [name] reported, "It was interesting and calming." She explained her mind wandered, but she was able to bring her attention back to her breathing and used her breath an anchor. Kathy [name] provided verbal consent during today's appointment for this provider to send the handout for today's mindfulness exercise via e-mail. Overall, Kathy [name] was receptive to today's session as evidenced by openness to sharing, responsiveness to feedback, and engagement in the mindfulness exercise.  Mental Status Examination:  Appearance: neat Behavior: cooperative Mood: euthymic Affect: mood congruent Speech: normal in rate, volume, and tone Eye Contact: appropriate Psychomotor Activity: appropriate Thought Process: linear, logical, and goal directed  Content/Perceptual Disturbances: denies suicidal and homicidal ideation, plan, and intent and no hallucinations, delusions, bizarre thinking or behavior  reported or observed Orientation: time, person, place and purpose of appointment Cognition/Sensorium: memory, attention, language, and fund  of knowledge intact  Insight: good Judgment: good  Structured Assessment Results: The Patient Health Questionnaire-9 (PHQ-9) is a self-report measure that assesses symptoms and severity of depression over the course of the last two weeks. Kathy [name] obtained a score of 2 suggesting minimal depression. Kathy [name] finds the endorsed symptoms to be not difficult at all. Decreased interest 0  Down, depressed, hopeless 1  Altered sleeping 0  Tired, decreased energy 0  Change in appetite 0  Feeling bad or failure about yourself 0  Trouble concentrating 1  Moving slowly or fidgety/restless 0  Suicidal thoughts 0  PHQ-9 Score 2    The Generalized Anxiety Disorder-7 (GAD-7) is a brief self-report measure that assesses symptoms of anxiety over the course of the last two weeks. Kathy [name] obtained a score of 3  suggesting minimal anxiety. Kathy [name] finds the endorsed symptoms to be not difficult at all. Nervous, anxious, on edge 1  Control/stop worrying 1  Worrying too much- different things 1  Trouble relaxing 0  Restless 0  Easily annoyed or irritable 0  Afraid-awful might happen 0  GAD-7 Score 3   Interventions:  Conducted a brief chart review Verbal administration of PHQ-9 and GAD-7 for symptom monitoring Provided empathic reflections and validation Reviewed content from the previous session Engaged patient in goal setting Engaged patient in a mindfulness exercise Employed supportive psychotherapy interventions to facilitate reduced distress, and to improve coping skills with identified stressors Employed acceptance and commitment interventions to emphasize mindfulness and acceptance without struggle  DSM-5 Diagnosis: 311 (F32.8) Other Specified Depressive Disorder, Emotional Eating Behaviors  Treatment Goal & Progress: During the initial appointment with  this provider, the following treatment goal was established: decrease emotional eating. Kathy [name] has demonstrated progress in her goal as evidenced by increased awareness of hunger patterns and triggers for emotional eating. Despite deviations from the structured meal plan, Kenny described implementing changes that are congruent to the structured meal plan, which help her make better choices. She also continues to demonstrate willingness to engage in learned skills to assist with coping.   Plan: Kathy [name] continues to appear able and willing to participate as evidenced by engagement in reciprocal conversation, and asking questions for clarification as appropriate. The next appointment will be scheduled in two weeks, which will be via [employer] WebEx. Once this provider's office resumes in-person appointments, Kathy [name] will be notified. The next session will focus on the introduction of thought defusion.

## 2018-06-23 ENCOUNTER — Encounter (INDEPENDENT_AMBULATORY_CARE_PROVIDER_SITE_OTHER): Payer: Self-pay | Admitting: Bariatrics

## 2018-06-23 ENCOUNTER — Ambulatory Visit (INDEPENDENT_AMBULATORY_CARE_PROVIDER_SITE_OTHER): Payer: BLUE CROSS/BLUE SHIELD | Admitting: Bariatrics

## 2018-06-23 ENCOUNTER — Other Ambulatory Visit: Payer: Self-pay

## 2018-06-23 DIAGNOSIS — E669 Obesity, unspecified: Secondary | ICD-10-CM

## 2018-06-23 DIAGNOSIS — Z6834 Body mass index (BMI) 34.0-34.9, adult: Secondary | ICD-10-CM

## 2018-06-23 DIAGNOSIS — F3289 Other specified depressive episodes: Secondary | ICD-10-CM | POA: Diagnosis not present

## 2018-06-23 DIAGNOSIS — E559 Vitamin D deficiency, unspecified: Secondary | ICD-10-CM | POA: Diagnosis not present

## 2018-06-23 NOTE — Progress Notes (Signed)
Office: [phone]  /  Fax: [phone] TeleHealth Visit:  Kathy [name] has verbally consented to this TeleHealth visit today. The patient is located at home, the provider is located at the [employer] office. The participants in this visit include the listed provider and patient and any and all parties involved. The visit was conducted today via WebEx.  HPI:   Chief Complaint: OBESITY Kathy [name] is here to discuss her progress with her obesity treatment plan. She is on the Category 2 plan and is following her eating plan approximately 78 % of the time. She states she is exercising 0 minutes 0 times per week. Kathy [name] states that her weight remains the same (weight 215 lbs). She states that the stress is better. Her appetite is normal. We were unable to weigh the patient today for this TeleHealth visit. She feels as if she has maintained weight since her last visit. She has lost 7 lbs since starting treatment with [place].  Vitamin D deficiency Kathy [name] has a diagnosis of vitamin D deficiency. She is currently taking vit D and denies nausea, vomiting or muscle weakness.  Depression with emotional eating behaviors Kathy [name] struggles with emotional eating and using food for comfort to the extent that it is negatively impacting her health. She often snacks when she is not hungry. Kathy [name] sometimes feels she is out of control and then feels guilty that she made poor food choices. She is seeing Dr. Dewaine Conger (bariatric psychologist) She has been working on behavior modification techniques to help reduce her emotional eating and has been somewhat successful. She shows no sign of suicidal or homicidal ideations.  Depression screen [facility] 2/9 06/08/2018 05/24/2018 05/09/2018 04/25/2018 04/04/2018  Decreased Interest 1 0 1 0 1  Down, Depressed, Hopeless 2 1 1 1 1   PHQ - 2 Score 3 1 2 1 2   Altered sleeping 1 0 0 0 0  Tired, decreased energy 0 1 0 1 3  Change in appetite 0 0 0 1 0  Feeling bad or  failure about yourself  0 0 0 0 1  Trouble concentrating 1 0 0 1 0  Moving slowly or fidgety/restless 0 0 0 0 0  Suicidal thoughts 0 0 0 0 0  PHQ-9 Score 5 2 2 4 6   Difficult doing work/chores - - - - -     ASSESSMENT AND PLAN:  Vitamin D deficiency - Plan: Comprehensive metabolic panel, Hemoglobin A1c, Insulin, random, Lipid Panel With LDL/HDL Ratio, VITAMIN D 25 Hydroxy (Vit-D Deficiency, Fractures), T3, T4, free, TSH  Other depression - with emotional eating  Class 1 obesity with serious comorbidity and body mass index (BMI) of 34.0 to 34.9 in adult, unspecified obesity type  PLAN:  Vitamin D Deficiency Kathy [name] was informed that low vitamin D levels contributes to fatigue and are associated with obesity, breast, and colon cancer. She will continue to take prescription Vit D @50 ,000 IU every week and will follow up for routine testing of vitamin D, at least 2-3 times per year. She was informed of the risk of over-replacement of vitamin D and agrees to not increase her dose unless she discusses this with [place] first.  Depression with Emotional Eating Behaviors We discussed behavior modification techniques today to help Kathy [name] deal with her emotional eating and depression. She will follow up with Dr. Dewaine Conger and she will practice mindful eating.  Obesity Kathy [name] is currently in the action stage of change. As such, her goal is to continue with weight loss efforts  She has agreed to follow the Category 2 plan and she wants to review the Pescatarian plan at the next visit Kathy [name] has been instructed to work up to a goal of 150 minutes of combined cardio and strengthening exercise per week for weight loss and overall health benefits. We discussed the following Behavioral Modification Strategies today: increase H2O intake, no skipping meals, keeping healthy foods in the home, increasing lean protein intake, decreasing simple carbohydrates , increasing vegetables, decrease eating out and work on  meal planning and intentional eating Kathy [name] will weigh herself at home until she is seen in the office.  Kathy [name] has agreed to follow up with our clinic in 2 weeks. She was informed of the importance of frequent follow up visits to maximize her success with intensive lifestyle modifications for her multiple health conditions.  ALLERGIES: No Known Allergies  MEDICATIONS: Current Outpatient Medications on File Prior to Visit  Medication Sig Dispense Refill  . acetaminophen (TYLENOL) 650 MG CR tablet Take 650 mg by mouth every 8 (eight) hours as needed for pain.    Marland Kitchen. ALPRAZolam (XANAX) 0.5 MG tablet Take 0.5 mg by mouth 2 (two) times daily as needed for anxiety.    . diclofenac sodium (VOLTAREN) 1 % GEL Apply 2 grams to 4 grams to affected area up to 4 times daily as needed. 4 Tube 2  . metroNIDAZOLE (METROGEL) 0.75 % vaginal gel as needed.    . Multiple Vitamins-Minerals (WOMENS ONE DAILY) TABS Take 1 tablet by mouth daily.    . valACYclovir (VALTREX) 500 MG tablet Take 500 mg by mouth daily as needed.     . Vitamin D, Ergocalciferol, (DRISDOL) 1.25 MG (50000 UT) CAPS capsule Take 1 capsule (50,000 Units total) by mouth every 7 (seven) days. 4 capsule 0   No current facility-administered medications on file prior to visit.     PAST MEDICAL HISTORY: Past Medical History:  Diagnosis Date  . Anxiety   . Arthritis   . Back pain   . Chest pain   . Dizziness   . Fatigue   . Generalized headaches   . Hand tingling   . Hip pain   . HLD (hyperlipidemia)   . HSV (herpes simplex virus) infection   . Knee pain   . Multiple food allergies    Avocado, Banana, [name], [name], sometimes apples:  Nausea, vomitting itching in throat  . Palpitations   . Postpartum care following vaginal delivery 10/05/2010    PAST SURGICAL HISTORY: Past Surgical History:  Procedure Laterality Date  . NO PAST SURGERIES      SOCIAL HISTORY: Social History   Tobacco Use  . Smoking status: Former  Smoker    Packs/day: 1.00    Years: 10.00    Pack years: 10.00    Types: Cigarettes    Last attempt to quit: 2007    Years since quitting: 13.3  . Smokeless tobacco: Never Used  Substance Use Topics  . Alcohol use: Yes    Comment: rare on ocassion  . Drug use: No    FAMILY HISTORY: Family History  Problem Relation Age of Onset  . Lymphoma Mother   . Breast cancer Maternal Grandmother   . Diabetes Maternal Grandmother   . Diabetes Maternal Grandfather   . Cancer Maternal Grandfather        Unsure if Prostate or colon  . Diabetes Paternal Grandmother   . Diabetes Paternal Grandfather   . Obesity Father     ROS: Review of Systems  Constitutional: Negative for weight loss.  Gastrointestinal: Negative for nausea and vomiting.  Musculoskeletal:       Negative for muscle weakness  Psychiatric/Behavioral: Positive for depression. Negative for suicidal ideas.    PHYSICAL EXAM: Pt in no acute distress  RECENT LABS AND TESTS: BMET    Component Value Date/Time   NA 138 03/01/2018 1120   K 4.7 03/01/2018 1120   CL 103 03/01/2018 1120   CO2 21 03/01/2018 1120   GLUCOSE 91 03/01/2018 1120   GLUCOSE 93 12/07/2017 1600   BUN 10 03/01/2018 1120   CREATININE 0.77 03/01/2018 1120   CREATININE 0.78 11/02/2014 1540   CALCIUM 9.3 03/01/2018 1120   GFRNONAA 97 03/01/2018 1120   GFRAA 112 03/01/2018 1120   Lab Results  Component Value Date   HGBA1C 5.4 03/01/2018   Lab Results  Component Value Date   INSULIN 18.8 03/01/2018   CBC    Component Value Date/Time   WBC 7.7 03/01/2018 1120   WBC 8.4 12/07/2017 1600   RBC 4.99 03/01/2018 1120   RBC 4.76 12/07/2017 1600   HGB 13.4 03/01/2018 1120   HCT 41.8 03/01/2018 1120   PLT 175.0 12/07/2017 1600   MCV 84 03/01/2018 1120   MCH 26.9 03/01/2018 1120   MCH 27.0 11/02/2014 1540   MCHC 32.1 03/01/2018 1120   MCHC 33.5 12/07/2017 1600   RDW 13.2 03/01/2018 1120   LYMPHSABS 2.3 03/01/2018 1120   MONOABS 0.6 12/07/2017  1600   EOSABS 0.1 03/01/2018 1120   BASOSABS 0.0 03/01/2018 1120   Iron/TIBC/Ferritin/ %Sat No results found for: IRON, TIBC, FERRITIN, IRONPCTSAT Lipid Panel     Component Value Date/Time   CHOL 162 03/01/2018 1120   TRIG 76 03/01/2018 1120   HDL 47 03/01/2018 1120   CHOLHDL 3 12/07/2017 1600   VLDL 27.8 12/07/2017 1600   LDLCALC 100 (H) 03/01/2018 1120   Hepatic Function Panel     Component Value Date/Time   PROT 6.9 03/01/2018 1120   ALBUMIN 4.5 03/01/2018 1120   AST 19 03/01/2018 1120   ALT 23 03/01/2018 1120   ALKPHOS 47 03/01/2018 1120   BILITOT 0.3 03/01/2018 1120      Component Value Date/Time   TSH 1.830 03/01/2018 1120   TSH 1.62 12/07/2017 1600   TSH 1.62 04/05/2017 1645    Results for KELLYJO, EDGREN (MRN [id number]) as of 06/23/2018 14:27  Ref. Range 03/01/2018 11:20  Vitamin D, 25-Hydroxy Latest Ref Range: 30.0 - 100.0 ng/mL 36.9   I, [place] Crane, am acting as Energy manager for [employer]. Manson Passey, DO  I have reviewed the above documentation for accuracy and completeness, and I agree with the above. -Corinna Capra, DO

## 2018-06-28 ENCOUNTER — Other Ambulatory Visit (INDEPENDENT_AMBULATORY_CARE_PROVIDER_SITE_OTHER): Payer: Self-pay | Admitting: Bariatrics

## 2018-06-28 DIAGNOSIS — E559 Vitamin D deficiency, unspecified: Secondary | ICD-10-CM

## 2018-07-05 DIAGNOSIS — E559 Vitamin D deficiency, unspecified: Secondary | ICD-10-CM | POA: Diagnosis not present

## 2018-07-05 NOTE — Progress Notes (Signed)
Office: [phone]  /  Fax: [phone]    Date: July 06, 2018   Appointment Start Time: 10:01am Duration: 30 minutes Provider: Glennie Isle, Psy.D. Type of Session: Individual Therapy  Location of Patient: Home Location of Provider: Provider's Home Type of Contact: Telepsychological Visit via [employer] WebEx   Session Content: Kathy [name] is a 43 y.o. female presenting via [place] for a follow-up appointment to address the previously established treatment goal of decreasing emotional eating. Today's appointment was a telepsychological visit, as this provider's clinic is seeing a limited number of patients for in-person visits due to COVID-19. Therapeutic services will resume to in-person appointments once deemed appropriate. [name] expressed understanding regarding the rationale for telepsychological services, and provided verbal consent for today's appointment. Prior to proceeding with today's appointment, [name]'s physical location at the time of this appointment was obtained. [name] reported she was at home and provided the address. In the event of technical difficulties, [name] shared a phone number she could be reached at. [name] and this provider participated in today's telepsychological service. Also, [name] denied anyone else being present in the room or on the WebEx appointment.  This provider conducted a brief check-in and verbally administered the PHQ-9 and GAD-7. [name] shared she had a "spontaenous" trip to [place]. She also met with her mother in [place]. 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 Regarding eating, [name] noted, "The morning is fine. Lunch is not terrible. Dinner time it kind of goes to the way side." As it relates to her prescribed meal plan, [name] explained, "I definitely think about it more than I  follow through on it. I'm following it probably 50% of the time." She added, "I know I have to get better." Obstacles/barriers for the aforementioned were explored. [name] noted when her older daughter was back from college, she focused on feeding her whatever she wanted to eat. However, she indicated her daughter is back to her own place. Moreover, [name] indicated she needs to purchase foods on her meal plan, which she plans to do this week or weekend. Regarding emotional eating, Erabella reported a belief she did not engage in emotional eating. However, she noted a night where she consumed a glass of wine and potato chips, but she was unsure if it was emotional hunger and physical hunger. Thus, this provider recommended she review the previously provided handout focusing on physical versus emotional hunger and using it to observe eating patterns in the coming weeks. Leveta agreed. Moreover, psychoeducation regarding thought defusion, including its impact on emotional eating and overall well-being was provided. [name] was led through a thought defusion exercise, and a handout with various exercises was provided via e-mail. [name] was encouraged to engage in the thought defusion exercises between now and the next appointment with this provider. [name] agreed. For the exercise, [name] used the thought, "I am disappointed." Following the exercise, [name] indicated, "I feel like when you have the thought and then you try to come out of it, it takes your mind off of that initial thought." [name] provided verbal consent during today's appointment for this provider to send the handout with thought defusion exercises via e-mail. Overall, Maycee was receptive to today's session as evidenced by openness to sharing, responsiveness to feedback, and willingness to engage in thought defusion exercises.  Mental Status Examination:  Appearance: neat Behavior: cooperative Mood: euthymic Affect: mood congruent  Speech: normal in rate, volume, and tone Eye Contact: appropriate Psychomotor Activity: appropriate Thought Process: linear, logical, and goal directed  Content/Perceptual Disturbances: denies suicidal and homicidal ideation, plan, and intent and no hallucinations, delusions, bizarre thinking or behavior reported or observed Orientation: time, person, place and none Cognition/Sensorium: memory, attention, language, and fund of knowledge intact  Insight: good Judgment: good  Structured Assessment Results: The Patient Health Questionnaire-9 (PHQ-9) is a self-report measure that assesses symptoms and severity of depression over the course of the last two weeks. [name] obtained a score of 2 suggesting minimal depression. [name] finds the endorsed symptoms to be not difficult at all. Decreased interest 0  Down, depressed, hopeless 1  Altered sleeping 0  Tired, decreased energy 0  Change in appetite 0  Feeling bad or failure about yourself 0  Trouble concentrating 1  Moving slowly or fidgety/restless 0  Suicidal thoughts 0  PHQ-9 Score 2    The Generalized Anxiety Disorder-7 (GAD-7) is a brief self-report measure that assesses symptoms of anxiety over the course of the last two weeks. [name] obtained a score of 8 suggesting mild anxiety. [name] finds the endorsed symptoms to be not difficult at all. Nervous, anxious, on edge 1  Control/stop worrying 3  Worrying too much- different things 3  Trouble relaxing 0  Restless 0  Easily annoyed or irritable 0  Afraid-awful might happen 1  GAD-7 Score 8   Interventions:  Conducted a brief chart review Verbal administration of PHQ-9 and GAD-7 for symptom monitoring Provided empathic reflections and validation Psychoeducation provided regarding thought defusion Engaged patient in a thought defusion exercise Employed motivational interviewing skills to assess patient's willingness/desire to adhere to recommended medical treatments and  assignments Employed supportive psychotherapy interventions to facilitate reduced distress, and to improve coping skills with identified stressors  DSM-5 Diagnosis: 311 (F32.8) Other Specified Depressive Disorder, Emotional Eating Behaviors  Treatment Goal & Progress: During the initial appointment with this provider, the following treatment goal was established: decrease emotional eating. Sendy has demonstrated progress in her goal as evidenced by increased awareness of hunger patterns and triggers for emotional eating. She continues to demonstrate willingness to engage in learned skills and described a reduction in emotional eating.   Plan: Cayleigh continues to appear able and willing to participate as evidenced by engagement in reciprocal conversation, and asking questions for clarification as appropriate. The next appointment will be scheduled in two weeks, which will be via [employer]. Once this provider's office resumes in-person appointments and it is deemed appropriate, Vivion will be notified. The next session will focus on reviewing thought defusion and the introduction of cognitive distortions.

## 2018-07-06 ENCOUNTER — Other Ambulatory Visit: Payer: Self-pay

## 2018-07-06 ENCOUNTER — Ambulatory Visit (INDEPENDENT_AMBULATORY_CARE_PROVIDER_SITE_OTHER): Payer: BLUE CROSS/BLUE SHIELD | Admitting: Psychology

## 2018-07-06 DIAGNOSIS — F3289 Other specified depressive episodes: Secondary | ICD-10-CM

## 2018-07-06 LAB — COMPREHENSIVE METABOLIC PANEL
ALT: 15 IU/L (ref 0–32)
AST: 17 IU/L (ref 0–40)
Albumin/Globulin Ratio: 1.9 (ref 1.2–2.2)
Albumin: 4.3 g/dL (ref 3.8–4.8)
Alkaline Phosphatase: 44 IU/L (ref 39–117)
BUN/Creatinine Ratio: 16 (ref 9–23)
BUN: 14 mg/dL (ref 6–24)
Bilirubin Total: 0.4 mg/dL (ref 0.0–1.2)
CO2: 22 mmol/L (ref 20–29)
Calcium: 8.9 mg/dL (ref 8.7–10.2)
Chloride: 103 mmol/L (ref 96–106)
Creatinine, Ser: 0.9 mg/dL (ref 0.57–1.00)
GFR calc Af Amer: 92 mL/min/{1.73_m2} (ref >59)
GFR calc non Af Amer: 80 mL/min/{1.73_m2} (ref >59)
Globulin, Total: 2.3 g/dL (ref 1.5–4.5)
Glucose: 92 mg/dL (ref 65–99)
Potassium: 5 mmol/L (ref 3.5–5.2)
Sodium: 139 mmol/L (ref 134–144)
Total Protein: 6.6 g/dL (ref 6.0–8.5)

## 2018-07-06 LAB — VITAMIN D 25 HYDROXY (VIT D DEFICIENCY, FRACTURES): Vit D, 25-Hydroxy: 42.7 ng/mL (ref 30.0–100.0)

## 2018-07-06 LAB — HEMOGLOBIN A1C
Est. average glucose Bld gHb Est-mCnc: 105 mg/dL
Hgb A1c MFr Bld: 5.3 % (ref 4.8–5.6)

## 2018-07-06 LAB — LIPID PANEL WITH LDL/HDL RATIO
Cholesterol, Total: 162 mg/dL (ref 100–199)
HDL: 49 mg/dL (ref >39)
LDL Calculated: 94 mg/dL (ref 0–99)
LDl/HDL Ratio: 1.9 ratio (ref 0.0–3.2)
Triglycerides: 93 mg/dL (ref 0–149)
VLDL Cholesterol Cal: 19 mg/dL (ref 5–40)

## 2018-07-06 LAB — T4, FREE: Free T4: 1 ng/dL (ref 0.82–1.77)

## 2018-07-06 LAB — TSH: TSH: 2.99 u[IU]/mL (ref 0.450–4.500)

## 2018-07-06 LAB — INSULIN, RANDOM: INSULIN: 12.9 u[IU]/mL (ref 2.6–24.9)

## 2018-07-06 LAB — T3: T3, Total: 103 ng/dL (ref 71–180)

## 2018-07-07 ENCOUNTER — Ambulatory Visit (INDEPENDENT_AMBULATORY_CARE_PROVIDER_SITE_OTHER): Payer: BLUE CROSS/BLUE SHIELD | Admitting: Physician Assistant

## 2018-07-07 ENCOUNTER — Encounter (INDEPENDENT_AMBULATORY_CARE_PROVIDER_SITE_OTHER): Payer: Self-pay | Admitting: Physician Assistant

## 2018-07-07 VITALS — BP 109/72 | HR 68 | Temp 98.1°F | Ht 65.0 in | Wt 215.0 lb

## 2018-07-07 DIAGNOSIS — Z9189 Other specified personal risk factors, not elsewhere classified: Secondary | ICD-10-CM

## 2018-07-07 DIAGNOSIS — Z6835 Body mass index (BMI) 35.0-35.9, adult: Secondary | ICD-10-CM | POA: Diagnosis not present

## 2018-07-07 DIAGNOSIS — E559 Vitamin D deficiency, unspecified: Secondary | ICD-10-CM

## 2018-07-07 DIAGNOSIS — E8881 Metabolic syndrome: Secondary | ICD-10-CM

## 2018-07-07 MED ORDER — VITAMIN D (ERGOCALCIFEROL) 1.25 MG (50000 UNIT) PO CAPS: 50000.0000 [IU] | ORAL_CAPSULE | ORAL | 0 refills | Status: DC

## 2018-07-07 NOTE — Progress Notes (Signed)
Office: [phone]  /  Fax: [phone]   HPI:   Chief Complaint: OBESITY Karolee StampsJanelle is here to discuss her progress with her obesity treatment plan. She is on the Category 2 plan and is following her eating plan approximately 50% of the time. She states she is exercising 0 minutes 0 times per week. Machaela reports that she has had trouble following the plan since her daughter was doing some baking. She is also not wanting to eat as much meat and is interested in the Pescatarian plan.  Her weight is 215 lb (97.5 kg) today and has gained 1 lb since her last visit. She has lost 7 lbs since starting treatment with us.  Vitamin D deficiency Karolee StampsJanelle has a diagnosis of Vitamin D deficiency and her last level was not at goal. She is currently taking prescription Vit D and denies nausea, vomiting or muscle weakness.  At risk for osteopenia and osteoporosis Jentri is at higher risk of osteopenia and osteoporosis due to Vitamin D deficiency.   Insulin Resistance Keamber has a diagnosis of insulin resistance based on her elevated fasting insulin level >5. Although Elli's blood glucose readings are still under good control, insulin resistance puts her at greater risk of metabolic syndrome and diabetes. She is on no medication and continues to work on diet and exercise to decrease risk of diabetes. No polyphagia.  ASSESSMENT AND PLAN:  Vitamin D deficiency - Plan: Vitamin D, Ergocalciferol, (DRISDOL) 1.25 MG (50000 UT) CAPS capsule  Insulin resistance  At risk for osteoporosis  Class 2 severe obesity with serious comorbidity and body mass index (BMI) of 35.0 to 35.9 in adult, unspecified obesity type (HCC)  PLAN:  Vitamin D Deficiency Jacolyn was informed that low Vitamin D levels contributes to fatigue and are associated with obesity, breast, and colon cancer. She agrees to continue to take prescription Vit D @ 50,000 IU every week #4 with 0 refills and add 5,000 units OTC daily. She will  follow-up for routine testing of Vitamin D, at least 2-3 times per year. She was informed of the risk of over-replacement of Vitamin D and agrees to not increase her dose unless she discusses this with us first. Karolee StampsJanelle agrees to follow-up with our clinic in 2 weeks.  At risk for osteopenia and osteoporosis Ronalee was given extended  (15 minutes) osteoporosis prevention counseling today. Karolee StampsJanelle is at risk for osteopenia and osteoporsis due to her Vitamin D deficiency. She was encouraged to take her Vitamin D and follow her higher calcium diet and increase strengthening exercise to help strengthen her bones and decrease her risk of osteopenia and osteoporosis.  Insulin Resistance Marqueta will continue to work on weight loss, exercise, and decreasing simple carbohydrates in her diet to help decrease the risk of diabetes. We dicussed metformin including benefits and risks. She was informed that eating too many simple carbohydrates or too many calories at one sitting increases the likelihood of GI side effects. Karolee StampsJanelle will continue with weight loss and will follow-up with us as directed to monitor her progress.  Obesity Karolee StampsJanelle is currently in the action stage of change. As such, her goal is to continue with weight loss efforts. She has agreed to switch to the [employer] plan. Karolee StampsJanelle has been instructed to work up to a goal of 150 minutes of combined cardio and strengthening exercise per week for weight loss and overall health benefits. We discussed the following Behavioral Modification Strategies today: increasing lean protein intake, work on meal planning and easy  cooking plans.  Reilly has agreed to follow-up with our clinic in 2 weeks. She was informed of the importance of frequent follow-up visits to maximize her success with intensive lifestyle modifications for her multiple health conditions.  ALLERGIES: No Known Allergies  MEDICATIONS: Current Outpatient Medications on File Prior to  Visit  Medication Sig Dispense Refill   Cholecalciferol (VITAMIN D) 125 MCG (5000 UT) CAPS Take 1 capsule by mouth daily.     acetaminophen (TYLENOL) 650 MG CR tablet Take 650 mg by mouth every 8 (eight) hours as needed for pain.     ALPRAZolam (XANAX) 0.5 MG tablet Take 0.5 mg by mouth 2 (two) times daily as needed for anxiety.     metroNIDAZOLE (METROGEL) 0.75 % vaginal gel as needed.     Multiple Vitamins-Minerals (WOMENS ONE DAILY) TABS Take 1 tablet by mouth daily.     valACYclovir (VALTREX) 500 MG tablet Take 500 mg by mouth daily as needed.      No current facility-administered medications on file prior to visit.     PAST MEDICAL HISTORY: Past Medical History:  Diagnosis Date   Anxiety    Arthritis    Back pain    Chest pain    Dizziness    Fatigue    Generalized headaches    Hand tingling    Hip pain    HLD (hyperlipidemia)    HSV (herpes simplex virus) infection    Knee pain    Multiple food allergies    Avocado, Banana, [name], [name], sometimes apples:  Nausea, vomitting itching in throat   Palpitations    Postpartum care following vaginal delivery 10/05/2010    PAST SURGICAL HISTORY: Past Surgical History:  Procedure Laterality Date   NO PAST SURGERIES      SOCIAL HISTORY: Social History   Tobacco Use   Smoking status: Former Smoker    Packs/day: 1.00    Years: 10.00    Pack years: 10.00    Types: Cigarettes    Last attempt to quit: 2007    Years since quitting: 13.4   Smokeless tobacco: Never Used  Substance Use Topics   Alcohol use: Yes    Comment: rare on ocassion   Drug use: No    FAMILY HISTORY: Family History  Problem Relation Age of Onset   Lymphoma Mother    Breast cancer Maternal Grandmother    Diabetes Maternal Grandmother    Diabetes Maternal Grandfather    Cancer Maternal Grandfather        Unsure if Prostate or colon   Diabetes Paternal Grandmother    Diabetes Paternal Grandfather     Obesity Father    ROS: Review of Systems  Gastrointestinal: Negative for nausea and vomiting.  Musculoskeletal:       Negative for muscle weakness.  Endo/Heme/Allergies:       Negative for polyphagia.   PHYSICAL EXAM: Blood pressure 109/72, pulse 68, temperature 98.1 F (36.7 C), height 5\' 5"  (1.651 m), weight 215 lb (97.5 kg), last menstrual period 04/06/2018, SpO2 95 %. Body mass index is 35.78 kg/m. Physical Exam Vitals signs reviewed.  Constitutional:      Appearance: Normal appearance. She is obese.  Cardiovascular:     Rate and Rhythm: Normal rate.     Pulses: Normal pulses.  Pulmonary:     Effort: Pulmonary effort is normal.     Breath sounds: Normal breath sounds.  Musculoskeletal: Normal range of motion.  Skin:    General: Skin is warm and dry.  Neurological:     Mental Status: She is alert and oriented to person, place, and time.  Psychiatric:        Behavior: Behavior normal.   RECENT LABS AND TESTS: BMET    Component Value Date/Time   NA 139 07/05/2018 0904   K 5.0 07/05/2018 0904   CL 103 07/05/2018 0904   CO2 22 07/05/2018 0904   GLUCOSE 92 07/05/2018 0904   GLUCOSE 93 12/07/2017 1600   BUN 14 07/05/2018 0904   CREATININE 0.90 07/05/2018 0904   CREATININE 0.78 11/02/2014 1540   CALCIUM 8.9 07/05/2018 0904   GFRNONAA 80 07/05/2018 0904   GFRAA 92 07/05/2018 0904   Lab Results  Component Value Date   HGBA1C 5.3 07/05/2018   HGBA1C 5.4 03/01/2018   Lab Results  Component Value Date   INSULIN 12.9 07/05/2018   INSULIN 18.8 03/01/2018   CBC    Component Value Date/Time   WBC 7.7 03/01/2018 1120   WBC 8.4 12/07/2017 1600   RBC 4.99 03/01/2018 1120   RBC 4.76 12/07/2017 1600   HGB 13.4 03/01/2018 1120   HCT 41.8 03/01/2018 1120   PLT 175.0 12/07/2017 1600   MCV 84 03/01/2018 1120   MCH 26.9 03/01/2018 1120   MCH 27.0 11/02/2014 1540   MCHC 32.1 03/01/2018 1120   MCHC 33.5 12/07/2017 1600   RDW 13.2 03/01/2018 1120   LYMPHSABS 2.3  03/01/2018 1120   MONOABS 0.6 12/07/2017 1600   EOSABS 0.1 03/01/2018 1120   BASOSABS 0.0 03/01/2018 1120   Iron/TIBC/Ferritin/ %Sat No results found for: IRON, TIBC, FERRITIN, IRONPCTSAT Lipid Panel     Component Value Date/Time   CHOL 162 07/05/2018 0904   TRIG 93 07/05/2018 0904   HDL 49 07/05/2018 0904   CHOLHDL 3 12/07/2017 1600   VLDL 27.8 12/07/2017 1600   LDLCALC 94 07/05/2018 0904   Hepatic Function Panel     Component Value Date/Time   PROT 6.6 07/05/2018 0904   ALBUMIN 4.3 07/05/2018 0904   AST 17 07/05/2018 0904   ALT 15 07/05/2018 0904   ALKPHOS 44 07/05/2018 0904   BILITOT 0.4 07/05/2018 0904      Component Value Date/Time   TSH 2.990 07/05/2018 0904   TSH 1.830 03/01/2018 1120   TSH 1.62 12/07/2017 1600   Results for BESSY, REANEY (MRN [id number]) as of 07/07/2018 15:11  Ref. Range 07/05/2018 09:04  Vitamin D, 25-Hydroxy Latest Ref Range: 30.0 - 100.0 ng/mL 42.7    OBESITY BEHAVIORAL INTERVENTION VISIT  Today's visit was #9   Starting weight: 222 lbs Starting date: 03/01/2018 Today's weight: 215 lbs  Today's date: 07/07/2018 Total lbs lost to date: 7  ASK: We discussed the diagnosis of obesity with [name] today and Micca agreed to give [place] permission to discuss obesity behavioral modification therapy today.  ASSESS: Angelette has the diagnosis of obesity and her BMI today is 35.8. Pricilla is in the action stage of change.   ADVISE: Fenna was educated on the multiple health risks of obesity as well as the benefit of weight loss to improve her health. She was advised of the need for long term treatment and the importance of lifestyle modifications to improve her current health and to decrease her risk of future health problems.  AGREE: Multiple dietary modification options and treatment options were discussed and  Tylin agreed to follow the recommendations documented in the above note.  ARRANGE: Gerry was educated on the  importance of frequent visits to treat  obesity as outlined per CMS and USPSTF guidelines and agreed to schedule her next follow up appointment today.  Fernanda Drum, am acting as transcriptionist for Alois Cliche, PA-C I, Alois Cliche, PA-C have reviewed above note and agree with its content

## 2018-07-09 ENCOUNTER — Encounter (INDEPENDENT_AMBULATORY_CARE_PROVIDER_SITE_OTHER): Payer: Self-pay | Admitting: Physician Assistant

## 2018-07-20 ENCOUNTER — Ambulatory Visit (INDEPENDENT_AMBULATORY_CARE_PROVIDER_SITE_OTHER): Payer: BC Managed Care – PPO | Admitting: Psychology

## 2018-07-20 ENCOUNTER — Other Ambulatory Visit: Payer: Self-pay

## 2018-07-20 ENCOUNTER — Ambulatory Visit (INDEPENDENT_AMBULATORY_CARE_PROVIDER_SITE_OTHER): Payer: BLUE CROSS/BLUE SHIELD | Admitting: Physician Assistant

## 2018-07-20 DIAGNOSIS — F3289 Other specified depressive episodes: Secondary | ICD-10-CM | POA: Diagnosis not present

## 2018-07-20 NOTE — Progress Notes (Addendum)
Office: [phone]  /  Fax: [phone]    Date: July 20, 2018   Appointment Start Time: 8:34am Duration: 26 minutes Provider: Glennie Isle, Psy.D. Type of Session: Individual Therapy  Location of Patient: Home Location of Provider: Provider's Home Type of Contact: Telepsychological Visit via [employer] Web[employer]   Session Content: Kathy [name] is a 43 y.o. female presenting via Kathy [name] for a follow-up appointment to address the previously established treatment goal of decreasing emotional eating. Of note, this provider called Kathy [name] at 8:33am, as she did not present for today's appointment. She explained she was "trying to get back in." As such, today's appointment was initiated 4 minutes late. Today's appointment was a telepsychological visit, as this provider's clinic is seeing a limited number of patients for in-person visits due to COVID-19. Therapeutic services will resume to in-person appointments once deemed appropriate. Kathy [name] expressed understanding regarding the rationale for telepsychological services, and provided verbal consent for today's appointment. Prior to proceeding with today's appointment, Kathy [name]'s physical location at the time of this appointment was obtained. Kathy [name] reported she was at home and provided the address. In the event of technical difficulties, Kathy [name] shared a phone number she could be reached at. Kathy [name] and this provider participated in today's telepsychological service. Also, Kathy [name] denied anyone else being present in the room or on the WebEx appointment.  This provider conducted a brief check-in and verbally administered the PHQ-9 and GAD-7. Kathy [name] reported she is still in the process of applying to jobs and she was granted the opportunity to re-take the test she previously did not pass to stay in her current position. Additionally, Kathy [name] reported she "broke up" with her boyfriend. Associated thoughts and feelings were briefly processed. Kathy [name] described  feeling "a little annoyed" and "relieved." Moreover, she discussed experiencing worry about her job and COVID-19. Furthermore, Kathy [name] reported engaging in positive self-talk and acknowledged she did not engage in thought defusion. She was encouraged to engage in thought defusion between now and the next appointment; she agreed.  While Kathy [name] reportedly gained weight, she reported improvement in her labs per her appointment with Kathy Potash, PA-C. Additionally, she discussed "doing better" with protein intake and "refusing" certain foods to ensure she is making better choices. Regarding emotional eating, Kathy [name] stated, "Maybe out of boredom." As such, psychoeducation regarding pleasurable activities, including its impact on emotional eating and overall well-being was provided. Kathy [name] was provided with a handout via e-mail with various options of pleasurable activities, and was encouraged to engage in one activity a day and additional activities as needed when triggered to emotionally eat. Kathy [name] agreed. Kathy [name] provided verbal consent during today's appointment for this provider to send the handout via e-mail. Session concluded with a discussion regarding termination planning. At this time, the frequency of appointments will be reduced. Kathy [name] was receptive to today's session as evidenced by openness to sharing, responsiveness to feedback, and willingness to engage in pleasurable activities.  Mental Status Examination:  Appearance: neat Behavior: cooperative Mood: euthymic Affect: mood congruent Speech: normal in rate, volume, and tone Eye Contact: appropriate Psychomotor Activity: appropriate Thought Process: linear, logical, and goal directed  Content/Perceptual Disturbances: denies suicidal and homicidal ideation, plan, and intent and no hallucinations, delusions, bizarre thinking or behavior reported or observed Orientation: time, person, place and purpose of appointment  Cognition/Sensorium: memory, attention, language, and fund of knowledge intact  Insight: good Judgment: good  Structured Assessment Results: The Patient Health Questionnaire-9 (PHQ-9) is a self-report measure that assesses symptoms and severity of depression over the  course of the last two weeks. Kathy [name] obtained a score of 1 suggesting minimal depression. Kathy [name] finds the endorsed symptoms to be not difficult at all. Little interest or pleasure in doing things 0  Feeling down, depressed, or hopeless 1  Trouble falling or staying asleep, or sleeping too much 0  Feeling tired or having little energy 0  Poor appetite or overeating 0  Feeling bad about yourself --- or that you are a failure or have let yourself or your family down 0  Trouble concentrating on things, such as reading the newspaper or watching television 0  Moving or speaking so slowly that other people could have noticed? Or the opposite --- being so fidgety or restless that you have been moving around a lot more than usual 0  Thoughts that you would be better off dead or hurting yourself in some way 0  PHQ-9 Score 1    The Generalized Anxiety Disorder-7 (GAD-7) is a brief self-report measure that assesses symptoms of anxiety over the course of the last two weeks. Kathy [name] obtained a score of 9 suggesting mild anxiety. Kathy [name] finds the endorsed symptoms to be not difficult at all. Feeling nervous, anxious, on edge 1  Not being able to stop or control worrying 3  Worrying too much about different things 3  Trouble relaxing 0  Being so restless that it's hard to sit still 0  Becoming easily annoyed or irritable 0  Feeling afraid as if something awful might happen 2  GAD-7 Score 9   Interventions:  Conducted a brief chart review Verbal administration of PHQ-9 and GAD-7 for symptom monitoring Provided empathic reflections and validation Processed thoughts and feelings Psychoeducation provided regarding pleasurable activities  Discussed termination planning Provided positive reinforcement Employed supportive psychotherapy interventions to facilitate reduced distress, and to improve coping skills with identified stressors  DSM-5 Diagnosis: 311 (F32.8) Other Specified Depressive Disorder, Emotional Eating Behaviors  Treatment Goal & Progress: During the initial appointment with this provider, the following treatment goal was established: decrease emotional eating. Kathy [name] has demonstrated progress in her goal as evidenced by increased awareness of hunger patterns and triggers for emotional eating, specifically boredom. She demonstrates willingness to engage in learned skills to assist with coping.   Plan: Kathy [name] continues to appear able and willing to participate as evidenced by engagement in reciprocal conversation, and asking questions for clarification as appropriate. The next appointment will be scheduled in two weeks, which will be via [employer] WebEx. Once this provider's office resumes in-person appointments and it is deemed appropriate, Kathy [name] will be notified. The next session will focus on reviewing pleasurable activities, and the introduction of cognitive distortions as they were not introduced today based on Beva's presenting concerns.

## 2018-07-21 ENCOUNTER — Encounter (INDEPENDENT_AMBULATORY_CARE_PROVIDER_SITE_OTHER): Payer: Self-pay | Admitting: Physician Assistant

## 2018-07-21 ENCOUNTER — Ambulatory Visit (INDEPENDENT_AMBULATORY_CARE_PROVIDER_SITE_OTHER): Payer: BC Managed Care – PPO | Admitting: Physician Assistant

## 2018-07-21 ENCOUNTER — Other Ambulatory Visit: Payer: Self-pay

## 2018-07-21 VITALS — BP 96/62 | HR 72 | Temp 97.8°F | Ht 65.0 in | Wt 216.0 lb

## 2018-07-21 DIAGNOSIS — E559 Vitamin D deficiency, unspecified: Secondary | ICD-10-CM | POA: Diagnosis not present

## 2018-07-21 DIAGNOSIS — Z6836 Body mass index (BMI) 36.0-36.9, adult: Secondary | ICD-10-CM

## 2018-07-21 NOTE — Progress Notes (Signed)
Office: [phone]  /  Fax: [phone]   HPI:   TIME: 25 minutes   Chief Complaint: OBESITY Kathy [name] is here to discuss her progress with her obesity treatment plan. She is on the Category 2/Pescatarian plan and is following her eating plan approximately 75% of the time. She states she is walking 30-35 minutes 2 times per week. Kathy [name] reports that she has not been measuring snack calories and has been eating more beans. She wants to start eating more fish. Her weight is 216 lb (98 kg) today and has had a weight gain of 1 lb since her last visit. She has lost 6 lbs since starting treatment with Kathy [name].  Vitamin D deficiency Kathy [name] has a diagnosis of Vitamin D deficiency. She is currently taking prescription and OTC Vit D and denies nausea, vomiting or muscle weakness.  ASSESSMENT AND PLAN:  Vitamin D deficiency  Class 2 severe obesity with serious comorbidity and body mass index (BMI) of 36.0 to 36.9 in adult, unspecified obesity type (HCC)  PLAN:  Vitamin D Deficiency Kathy [name] was informed that low Vitamin D levels contributes to fatigue and are associated with obesity, breast, and colon cancer. She agrees to continue taking Vit D and will follow-up for routine testing of Vitamin D, at least 2-3 times per year. She was informed of the risk of over-replacement of Vitamin D and agrees to not increase her dose unless she discusses this with Kathy [name] first. Kathy [name] agrees to follow-up with our clinic in 2 weeks.  I spent > than 50% of the 25 minute visit on counseling as documented in the note.  Obesity Kathy [name] is currently in the action stage of change. As such, her goal is to continue with weight loss efforts. She has agreed to follow the Category 2 plan. Kathy [name] has been instructed to work up to a goal of 150 minutes of combined cardio and strengthening exercise per week for weight loss and overall health benefits. We discussed the following Behavioral Modification Strategies today:  work on meal planning, easy cooking plans, and keeping healthy foods in the home.  Kathy [name] has agreed to follow-up with our clinic in 2 weeks. She was informed of the importance of frequent follow-up visits to maximize her success with intensive lifestyle modifications for her multiple health conditions.  I spent > than 50% of the 25 minute visit on counseling as documented in the note.   ALLERGIES: No Known Allergies  MEDICATIONS: Current Outpatient Medications on File Prior to Visit  Medication Sig Dispense Refill   acetaminophen (TYLENOL) 650 MG CR tablet Take 650 mg by mouth every 8 (eight) hours as needed for pain.     ALPRAZolam (XANAX) 0.5 MG tablet Take 0.5 mg by mouth 2 (two) times daily as needed for anxiety.     Cholecalciferol (VITAMIN D) 125 MCG (5000 UT) CAPS Take 1 capsule by mouth daily.     metroNIDAZOLE (METROGEL) 0.75 % vaginal gel as needed.     Multiple Vitamins-Minerals (WOMENS ONE DAILY) TABS Take 1 tablet by mouth daily.     valACYclovir (VALTREX) 500 MG tablet Take 500 mg by mouth daily as needed.      Vitamin D, Ergocalciferol, (DRISDOL) 1.25 MG (50000 UT) CAPS capsule Take 1 capsule (50,000 Units total) by mouth every 7 (seven) days. 4 capsule 0   No current facility-administered medications on file prior to visit.     PAST MEDICAL HISTORY: Past Medical History:  Diagnosis Date   Anxiety    Arthritis  Back pain    Chest pain    Dizziness    Fatigue    Generalized headaches    Hand tingling    Hip pain    HLD (hyperlipidemia)    HSV (herpes simplex virus) infection    Knee pain    Multiple food allergies    Kathy [name], Kathy [name], Kathy [name], Kathy [name], Kathy [name]:  Nausea, vomitting itching in throat   Palpitations    Postpartum care following vaginal delivery 10/05/2010    PAST SURGICAL HISTORY: Past Surgical History:  Procedure Laterality Date   NO PAST SURGERIES      SOCIAL HISTORY: Social History   Tobacco Use     Smoking status: Former Smoker    Packs/day: 1.00    Years: 10.00    Pack years: 10.00    Types: Cigarettes    Quit date: 2007    Years since quitting: 13.4   Smokeless tobacco: Never Used  Substance Use Topics   Alcohol use: Yes    Comment: rare on ocassion   Drug use: No    FAMILY HISTORY: Family History  Problem Relation Age of Onset   Lymphoma Mother    Breast cancer Maternal Grandmother    Diabetes Maternal Grandmother    Diabetes Maternal Grandfather    Cancer Maternal Grandfather        Unsure if Prostate or colon   Diabetes Paternal Grandmother    Diabetes Paternal Grandfather    Obesity Father    ROS: Review of Systems  Gastrointestinal: Negative for nausea and vomiting.  Musculoskeletal:       Negative for muscle weakness.   PHYSICAL EXAM: Blood pressure 96/62, pulse 72, temperature 97.8 F (36.6 C), temperature source Oral, height 5\' 5"  (1.651 m), weight 216 lb (98 kg), SpO2 99 %. Body mass index is 35.94 kg/m. Physical Exam Vitals signs reviewed.  Constitutional:      Appearance: Normal appearance. She is obese.  Cardiovascular:     Rate and Rhythm: Normal rate.     Pulses: Normal pulses.  Pulmonary:     Effort: Pulmonary effort is normal.     Breath sounds: Normal breath sounds.  Musculoskeletal: Normal range of motion.  Skin:    General: Skin is warm and dry.  Neurological:     Mental Status: She is alert and oriented to person, place, and time.  Psychiatric:        Behavior: Behavior normal.   RECENT LABS AND TESTS: BMET    Component Value Date/Time   NA 139 07/05/2018 0904   K 5.0 07/05/2018 0904   CL 103 07/05/2018 0904   CO2 22 07/05/2018 0904   GLUCOSE 92 07/05/2018 0904   GLUCOSE 93 12/07/2017 1600   BUN 14 07/05/2018 0904   CREATININE 0.90 07/05/2018 0904   CREATININE 0.78 11/02/2014 1540   CALCIUM 8.9 07/05/2018 0904   GFRNONAA 80 07/05/2018 0904   GFRAA 92 07/05/2018 0904   Lab Results  Component Value Date    HGBA1C 5.3 07/05/2018   HGBA1C 5.4 03/01/2018   Lab Results  Component Value Date   INSULIN 12.9 07/05/2018   INSULIN 18.8 03/01/2018   CBC    Component Value Date/Time   WBC 7.7 03/01/2018 1120   WBC 8.4 12/07/2017 1600   RBC 4.99 03/01/2018 1120   RBC 4.76 12/07/2017 1600   HGB 13.4 03/01/2018 1120   HCT 41.8 03/01/2018 1120   PLT 175.0 12/07/2017 1600   MCV 84 03/01/2018 1120   MCH 26.9 03/01/2018  1120   MCH 27.0 11/02/2014 1540   MCHC 32.1 03/01/2018 1120   MCHC 33.5 12/07/2017 1600   RDW 13.2 03/01/2018 1120   LYMPHSABS 2.3 03/01/2018 1120   MONOABS 0.6 12/07/2017 1600   EOSABS 0.1 03/01/2018 1120   BASOSABS 0.0 03/01/2018 1120   Iron/TIBC/Ferritin/ %Sat No results found for: IRON, TIBC, FERRITIN, IRONPCTSAT Lipid Panel     Component Value Date/Time   CHOL 162 07/05/2018 0904   TRIG 93 07/05/2018 0904   HDL 49 07/05/2018 0904   CHOLHDL 3 12/07/2017 1600   VLDL 27.8 12/07/2017 1600   LDLCALC 94 07/05/2018 0904   Hepatic Function Panel     Component Value Date/Time   PROT 6.6 07/05/2018 0904   ALBUMIN 4.3 07/05/2018 0904   AST 17 07/05/2018 0904   ALT 15 07/05/2018 0904   ALKPHOS 44 07/05/2018 0904   BILITOT 0.4 07/05/2018 0904      Component Value Date/Time   TSH 2.990 07/05/2018 0904   TSH 1.830 03/01/2018 1120   TSH 1.62 12/07/2017 1600   Results for Georgeanne NimCARTER, Ivanna N (MRN [id number]) as of 07/21/2018 14:01  Ref. Range 07/05/2018 09:04  Vitamin D, 25-Hydroxy Latest Ref Range: 30.0 - 100.0 ng/mL 42.7    OBESITY BEHAVIORAL INTERVENTION VISIT  Today's visit was #10    Starting weight: 222 lbs Starting date: 03/01/2018  Today's weight: 216 lbs  Today's date: 07/21/2018 Total lbs lost to date: 6  ASK: We discussed the diagnosis of obesity with Georgeanne NimJanelle N Wollenberg today and Dayelin agreed to give Kathy [name] permission to discuss obesity behavioral modification therapy today.  ASSESS: Kathy [name] has the diagnosis of obesity and her BMI today is  36.0. Kathy [name] is in the action stage of change.   ADVISE: Kathy [name] was educated on the multiple health risks of obesity as well as the benefit of weight loss to improve her health. She was advised of the need for long term treatment and the importance of lifestyle modifications to improve her current health and to decrease her risk of future health problems.  AGREE: Multiple dietary modification options and treatment options were discussed and  Shiloh agreed to follow the recommendations documented in the above note.  ARRANGE: Kathy [name] was educated on the importance of frequent visits to treat obesity as outlined per CMS and USPSTF guidelines and agreed to schedule her next follow up appointment today.  Fernanda DrumI, Denise Haag, am acting as transcriptionist for Alois Clicheracey Aguilar, PA-C I, Alois Clicheracey Aguilar, PA-C have reviewed above note and agree with its content

## 2018-08-02 ENCOUNTER — Encounter (INDEPENDENT_AMBULATORY_CARE_PROVIDER_SITE_OTHER): Payer: Self-pay | Admitting: Physician Assistant

## 2018-08-02 NOTE — Telephone Encounter (Signed)
Ok to fill   thanks

## 2018-08-02 NOTE — Telephone Encounter (Signed)
Please advise? Last refill on 6/5. Last appt on 6/18. Next appt on 7/14. 6/2 vit D level 42.7.

## 2018-08-03 ENCOUNTER — Other Ambulatory Visit (INDEPENDENT_AMBULATORY_CARE_PROVIDER_SITE_OTHER): Payer: Self-pay

## 2018-08-03 DIAGNOSIS — E559 Vitamin D deficiency, unspecified: Secondary | ICD-10-CM

## 2018-08-03 MED ORDER — VITAMIN D (ERGOCALCIFEROL) 1.25 MG (50000 UNIT) PO CAPS: 50000.0000 [IU] | ORAL_CAPSULE | ORAL | 0 refills | Status: DC

## 2018-08-10 ENCOUNTER — Ambulatory Visit (INDEPENDENT_AMBULATORY_CARE_PROVIDER_SITE_OTHER): Payer: BC Managed Care – PPO | Admitting: Physician Assistant

## 2018-08-10 ENCOUNTER — Ambulatory Visit (INDEPENDENT_AMBULATORY_CARE_PROVIDER_SITE_OTHER): Payer: Self-pay | Admitting: Psychology

## 2018-08-16 ENCOUNTER — Other Ambulatory Visit: Payer: Self-pay

## 2018-08-16 ENCOUNTER — Encounter (INDEPENDENT_AMBULATORY_CARE_PROVIDER_SITE_OTHER): Payer: Self-pay | Admitting: Physician Assistant

## 2018-08-16 ENCOUNTER — Ambulatory Visit (INDEPENDENT_AMBULATORY_CARE_PROVIDER_SITE_OTHER): Payer: BC Managed Care – PPO | Admitting: Physician Assistant

## 2018-08-16 VITALS — BP 112/76 | HR 78 | Temp 98.4°F | Ht 65.0 in | Wt 217.0 lb

## 2018-08-16 DIAGNOSIS — E8881 Metabolic syndrome: Secondary | ICD-10-CM

## 2018-08-16 DIAGNOSIS — Z9189 Other specified personal risk factors, not elsewhere classified: Secondary | ICD-10-CM

## 2018-08-16 DIAGNOSIS — E559 Vitamin D deficiency, unspecified: Secondary | ICD-10-CM | POA: Diagnosis not present

## 2018-08-16 DIAGNOSIS — Z6836 Body mass index (BMI) 36.0-36.9, adult: Secondary | ICD-10-CM

## 2018-08-16 MED ORDER — VITAMIN D (ERGOCALCIFEROL) 1.25 MG (50000 UNIT) PO CAPS: 50000.0000 [IU] | ORAL_CAPSULE | ORAL | 0 refills | Status: DC

## 2018-08-17 NOTE — Progress Notes (Signed)
Office: [phone]  /  Fax: [phone]   HPI:   Chief Complaint: OBESITY Kathy [name] is here to discuss her progress with her obesity treatment plan. She is on the Category 2 plan and is following her eating plan approximately 0 % of the time. She states she is exercising 0 minutes 0 times per week. Kathy [name] reports that she has not been able to refocus since being sent home from work. She is ready to get back on track.  Her weight is 217 lb (98.4 kg) today and has gained 1 lb since her last visit. She has lost 5 lbs since starting treatment with us.  Vitamin D Deficiency Kathy [name] has a diagnosis of vitamin D deficiency. She is currently taking OTC Vit D and prescription Vit D. She denies nausea, vomiting or muscle weakness.  Insulin Resistance Tuesday has a diagnosis of insulin resistance based on her elevated fasting insulin level >5. Although Kathy [name]'s blood glucose readings are still under good control, insulin resistance puts her at greater risk of metabolic syndrome and diabetes. She is not taking any medications and she denies polyphagia. She continues to work on diet and exercise to decrease risk of diabetes.  At risk for diabetes Kathy [name] is at higher than average risk for developing diabetes due to her obesity and insulin resistance. She currently denies polyuria or polydipsia.  ASSESSMENT AND PLAN:  Vitamin D deficiency - Plan: Vitamin D, Ergocalciferol, (DRISDOL) 1.25 MG (50000 UT) CAPS capsule  Insulin resistance  At risk for diabetes mellitus  Class 2 severe obesity with serious comorbidity and body mass index (BMI) of 36.0 to 36.9 in adult, unspecified obesity type (HCC)  PLAN:  Vitamin D Deficiency Kathy [name] was informed that low vitamin D levels contributes to fatigue and are associated with obesity, breast, and colon cancer. Kathy [name] agrees to continue taking prescription Vit D 50,000 IU every week #4 and we will refill for 1 month. She will follow up for routine  testing of vitamin D, at least 2-3 times per year. She was informed of the risk of over-replacement of vitamin D and agrees to not increase her dose unless she discusses this with us first. Kathy [name] agrees to follow up with our clinic in 2 to 3 weeks.  Insulin Resistance Kathy [name] will continue to work on weight loss, exercise, and decreasing simple carbohydrates in her diet to help decrease the risk of diabetes. We dicussed metformin including benefits and risks. She was informed that eating too many simple carbohydrates or too many calories at one sitting increases the likelihood of GI side effects. Kathy [name] declined metformin for now and prescription was not written today. Kathy [name] agrees to follow up with our clinic in 2 to 3 weeks as directed to monitor her progress.  Diabetes risk counseling Kathy [name] was given extended (15 minutes) diabetes prevention counseling today. She is 43 y.o. female and has risk factors for diabetes including obesity and insulin resistance. We discussed intensive lifestyle modifications today with an emphasis on weight loss as well as increasing exercise and decreasing simple carbohydrates in her diet.  Obesity Kathy [name] is currently in the action stage of change. As such, her goal is to continue with weight loss efforts She has agreed to follow the Category 2 plan Kathy [name] has been instructed to work up to a goal of 150 minutes of combined cardio and strengthening exercise per week for weight loss and overall health benefits. We discussed the following Behavioral Modification Strategies today: work on meal planning and easy cooking plans and  keeping healthy foods in the home   Kathy [name] has agreed to follow up with our clinic in 2 to 3 weeks. She was informed of the importance of frequent follow up visits to maximize her success with intensive lifestyle modifications for her multiple health conditions.  ALLERGIES: No Known Allergies  MEDICATIONS: Current Outpatient  Medications on File Prior to Visit  Medication Sig Dispense Refill  . acetaminophen (TYLENOL) 650 MG CR tablet Take 650 mg by mouth every 8 (eight) hours as needed for pain.    Marland Kitchen. ALPRAZolam (XANAX) 0.5 MG tablet Take 0.5 mg by mouth 2 (two) times daily as needed for anxiety.    . Cholecalciferol (VITAMIN D) 125 MCG (5000 UT) CAPS Take 1 capsule by mouth daily.    . metroNIDAZOLE (METROGEL) 0.75 % vaginal gel as needed.    . Multiple Vitamins-Minerals (WOMENS ONE DAILY) TABS Take 1 tablet by mouth daily.    . valACYclovir (VALTREX) 500 MG tablet Take 500 mg by mouth daily as needed.      No current facility-administered medications on file prior to visit.     PAST MEDICAL HISTORY: Past Medical History:  Diagnosis Date  . Anxiety   . Arthritis   . Back pain   . Chest pain   . Dizziness   . Fatigue   . Generalized headaches   . Hand tingling   . Hip pain   . HLD (hyperlipidemia)   . HSV (herpes simplex virus) infection   . Knee pain   . Multiple food allergies    Avocado, Banana, [name], [name], sometimes apples:  Nausea, vomitting itching in throat  . Palpitations   . Postpartum care following vaginal delivery 10/05/2010    PAST SURGICAL HISTORY: Past Surgical History:  Procedure Laterality Date  . NO PAST SURGERIES      SOCIAL HISTORY: Social History   Tobacco Use  . Smoking status: Former Smoker    Packs/day: 1.00    Years: 10.00    Pack years: 10.00    Types: Cigarettes    Quit date: 2007    Years since quitting: 13.5  . Smokeless tobacco: Never Used  Substance Use Topics  . Alcohol use: Yes    Comment: rare on ocassion  . Drug use: No    FAMILY HISTORY: Family History  Problem Relation Age of Onset  . Lymphoma Mother   . Breast cancer Maternal Grandmother   . Diabetes Maternal Grandmother   . Diabetes Maternal Grandfather   . Cancer Maternal Grandfather        Unsure if Prostate or colon  . Diabetes Paternal Grandmother   . Diabetes Paternal  Grandfather   . Obesity Father     ROS: Review of Systems  Constitutional: Negative for weight loss.  Gastrointestinal: Negative for nausea and vomiting.  Genitourinary: Negative for frequency.  Musculoskeletal:       Negative muscle weakness  Endo/Heme/Allergies: Negative for polydipsia.       Negative polyphagia    PHYSICAL EXAM: Blood pressure 112/76, pulse 78, temperature 98.4 F (36.9 C), temperature source Oral, height 5\' 5"  (1.651 m), weight 217 lb (98.4 kg), SpO2 96 %. Body mass index is 36.11 kg/m. Physical Exam Vitals signs reviewed.  Constitutional:      Appearance: Normal appearance. She is obese.  Cardiovascular:     Rate and Rhythm: Normal rate.     Pulses: Normal pulses.  Pulmonary:     Effort: Pulmonary effort is normal.     Breath sounds:  Normal breath sounds.  Musculoskeletal: Normal range of motion.  Skin:    General: Skin is warm and dry.  Neurological:     Mental Status: She is alert and oriented to person, place, and time.  Psychiatric:        Mood and Affect: Mood normal.        Behavior: Behavior normal.     RECENT LABS AND TESTS: BMET    Component Value Date/Time   NA 139 07/05/2018 0904   K 5.0 07/05/2018 0904   CL 103 07/05/2018 0904   CO2 22 07/05/2018 0904   GLUCOSE 92 07/05/2018 0904   GLUCOSE 93 12/07/2017 1600   BUN 14 07/05/2018 0904   CREATININE 0.90 07/05/2018 0904   CREATININE 0.78 11/02/2014 1540   CALCIUM 8.9 07/05/2018 0904   GFRNONAA 80 07/05/2018 0904   GFRAA 92 07/05/2018 0904   Lab Results  Component Value Date   HGBA1C 5.3 07/05/2018   HGBA1C 5.4 03/01/2018   Lab Results  Component Value Date   INSULIN 12.9 07/05/2018   INSULIN 18.8 03/01/2018   CBC    Component Value Date/Time   WBC 7.7 03/01/2018 1120   WBC 8.4 12/07/2017 1600   RBC 4.99 03/01/2018 1120   RBC 4.76 12/07/2017 1600   HGB 13.4 03/01/2018 1120   HCT 41.8 03/01/2018 1120   PLT 175.0 12/07/2017 1600   MCV 84 03/01/2018 1120   MCH  26.9 03/01/2018 1120   MCH 27.0 11/02/2014 1540   MCHC 32.1 03/01/2018 1120   MCHC 33.5 12/07/2017 1600   RDW 13.2 03/01/2018 1120   LYMPHSABS 2.3 03/01/2018 1120   MONOABS 0.6 12/07/2017 1600   EOSABS 0.1 03/01/2018 1120   BASOSABS 0.0 03/01/2018 1120   Iron/TIBC/Ferritin/ %Sat No results found for: IRON, TIBC, FERRITIN, IRONPCTSAT Lipid Panel     Component Value Date/Time   CHOL 162 07/05/2018 0904   TRIG 93 07/05/2018 0904   HDL 49 07/05/2018 0904   CHOLHDL 3 12/07/2017 1600   VLDL 27.8 12/07/2017 1600   LDLCALC 94 07/05/2018 0904   Hepatic Function Panel     Component Value Date/Time   PROT 6.6 07/05/2018 0904   ALBUMIN 4.3 07/05/2018 0904   AST 17 07/05/2018 0904   ALT 15 07/05/2018 0904   ALKPHOS 44 07/05/2018 0904   BILITOT 0.4 07/05/2018 0904      Component Value Date/Time   TSH 2.990 07/05/2018 0904   TSH 1.830 03/01/2018 1120   TSH 1.62 12/07/2017 1600      OBESITY BEHAVIORAL INTERVENTION VISIT  Today's visit was # 11   Starting weight: 222 lbs Starting date: 03/01/2018 Today's weight : 217 lbs Today's date: 08/16/2018 Total lbs lost to date: 5    ASK: We discussed the diagnosis of obesity with Kathy [name] today and Kathy [name] agreed to give [place] permission to discuss obesity behavioral modification therapy today.  ASSESS: [place] has the diagnosis of obesity and her BMI today is 36.11 Essa is in the action stage of change   ADVISE: Junie was educated on the multiple health risks of obesity as well as the benefit of weight loss to improve her health. She was advised of the need for long term treatment and the importance of lifestyle modifications to improve her current health and to decrease her risk of future health problems.  AGREE: Multiple dietary modification options and treatment options were discussed and  Zitlali agreed to follow the recommendations documented in the above note.  ARRANGE: Mel[place] was  educated on the importance of  frequent visits to treat obesity as outlined per CMS and USPSTF guidelines and agreed to schedule her next follow up appointment today.  Trude McburneyI, Donnia Poplaski, am acting as transcriptionist for Alois Clicheracey Aguilar, PA-C I, Alois Clicheracey Aguilar, PA-C have reviewed above note and agree with its content

## 2018-08-22 ENCOUNTER — Other Ambulatory Visit: Payer: Self-pay

## 2018-08-22 ENCOUNTER — Ambulatory Visit (INDEPENDENT_AMBULATORY_CARE_PROVIDER_SITE_OTHER): Payer: BC Managed Care – PPO | Admitting: Psychology

## 2018-08-22 DIAGNOSIS — F3289 Other specified depressive episodes: Secondary | ICD-10-CM | POA: Diagnosis not present

## 2018-08-22 NOTE — Progress Notes (Signed)
Office: [phone]  /  Fax: [phone]    Date: August 22, 2018    Appointment Start Time: 10:04am Duration: 29 minutes Provider: Glennie Isle, Psy.D. Type of Session: Individual Therapy  Location of Patient: Home Location of Provider: Provider's Home Type of Contact: Telepsychological Visit via [employer] Web[employer]   Session Content: Kathy [name] is a 43 y.o. female presenting via [place] for a follow-up appointment to address the previously established treatment goal of decreasing emotional eating. Of note, this provider called Kathy [name] at 10:03am as she did not present for today's appointment. Kathy [name] noted, "I totally forgot. I'll join now." As such, today's appointment was initiated 4 minutes late. Today's appointment was a telepsychological visit, as this provider's clinic is seeing a limited number of patients for in-person visits due to COVID-19. Therapeutic services will resume to in-person appointments once deemed appropriate. Sieanna expressed understanding regarding the rationale for telepsychological services, and provided verbal consent for today's appointment. Prior to proceeding with today's appointment, Kathy [name]'s physical location at the time of this appointment was obtained. Kathy [name] reported she was at home and provided the address. In the event of technical difficulties, Kathy [name] shared a phone number she could be reached at. Kathy [name] and this provider participated in today's telepsychological service. Also, Kathy [name] denied anyone else being present in the room or on the WebEx appointment.  This provider conducted a brief check-in and verbally administered the PHQ-9 and GAD-7. Kathy [name] shared recent updates, including a vacation. Regarding eating, Kathy [name] noted she is not following the meal plan and she met with Kathy Potash, PA-C last week. She noted a plan "to get re focused" after her appointment with Kathy [name]. She further explained she has not had "the mental focus" to follow the  meal plan, as she is focused on studying for a work exam. Regarding emotional eating, Kathy [name] noted, "I haven't really been thinking about my eating." Due to ongoing stressors and continued emotional eating, it was recommended a referral be placed for longer-term therapeutic services. Options on establishing care with a new provider was discussed, including Kathy [name] calling her insurance company for a list of in-network providers, exploring [email or web address], or this provider placing a referral. Kathy [name] noted, "I guess you can put in a referral." She provided verbal consent for this provider to add it is therapy for emotional eating and ongoing stressors. This provider reviewed pleasurable activities. She indicated she initially engaged in activities, but then stopped. The importance of self-care was reviewed as Kathy [name] discussed ongoing stress and fatigue associated with studying. This provider and Kathy [name] reviewed the list previously provided and Kathy [name] was observed circling options. Kathy [name] was receptive to today's session as evidenced by openness to sharing, responsiveness to feedback, and willingness to engage in pleasurable activities.  Mental Status Examination:  Appearance: neat Behavior: cooperative Mood: euthymic Affect: mood congruent Speech: normal in rate, volume, and tone Eye Contact: appropriate Psychomotor Activity: appropriate Thought Process: linear, logical, and goal directed  Content/Perceptual Disturbances: denies suicidal and homicidal ideation, plan, and intent and no hallucinations, delusions, bizarre thinking or behavior reported or observed Orientation: time, person, place and purpose of appointment Cognition/Sensorium: memory, attention, language, and fund of knowledge intact  Insight: fair Judgment: fair  Structured Assessment Results: The Patient Health Questionnaire-9 (PHQ-9) is a self-report measure that assesses symptoms and severity of depression over the course of  the last two weeks. Kathy [name] obtained a score of 6 suggesting mild depression. Kathy [name] finds the endorsed symptoms to be not difficult at all. Little interest or pleasure in  doing things 0  Feeling down, depressed, or hopeless 0  Trouble falling or staying asleep, or sleeping too much 0  Feeling tired or having little energy 3  Poor appetite or overeating 0  Feeling bad about yourself --- or that you are a failure or have let yourself or your family down 0  Trouble concentrating on things, such as reading the newspaper or watching television 3  Moving or speaking so slowly that other people could have noticed? Or the opposite --- being so fidgety or restless that you have been moving around a lot more than usual 0  Thoughts that you would be better off dead or hurting yourself in some way 0  PHQ-9 Score 6    The Generalized Anxiety Disorder-7 (GAD-7) is a brief self-report measure that assesses symptoms of anxiety over the course of the last two weeks. Kathy [name] obtained a score of 7 suggesting mild anxiety. Kathy [name] finds the endorsed symptoms to be not difficult at all. Feeling nervous, anxious, on edge 0  Not being able to stop or control worrying 3  Worrying too much about different things 3  Trouble relaxing 0  Being so restless that it's hard to sit still 0  Becoming easily annoyed or irritable 0  Feeling afraid as if something awful might happen 1  GAD-7 Score 7   Interventions:  Conducted a brief chart review Verbal administration of PHQ-9 and GAD-7 for symptom monitoring Provided empathic reflections and validation Reviewed content from the previous session Discussed option for a referral for longer-term therapeutic services Employed supportive psychotherapy interventions to facilitate reduced distress, and to improve coping skills with identified stressors Discussed importance of self-care  DSM-5 Diagnosis: 311 (F32.8) Other Specified Depressive Disorder, Emotional Eating  Behaviors  Treatment Goal & Progress: During the initial appointment with this provider, the following treatment goal was established: decrease emotional eating. Rubi has some demonstrated progress in her goal as evidenced by increased awareness of hunger patterns and triggers for emotional eating. Despite deviations from the meal plan and engagement in emotional eating, Kathy [name] continues demonstrate willingness to engage in learned skills to assist with coping.   Plan: Kathy [name] continues to appear able and willing to participate as evidenced by engagement in reciprocal conversation, and asking questions for clarification as appropriate. The next appointment will be scheduled in two weeks, which will be via [employer]. Once this provider's office resumes in-person appointments and it is deemed appropriate, Kathy [name] will be notified. The next session will focus on reviewing learned skills, and the exploration of cognitive distortions associated with emotional eating as they were not introduced during today's appointment based on Kathy [name]'s presenting concerns.

## 2018-08-29 ENCOUNTER — Encounter (INDEPENDENT_AMBULATORY_CARE_PROVIDER_SITE_OTHER): Payer: Self-pay | Admitting: Physician Assistant

## 2018-08-29 NOTE — Telephone Encounter (Signed)
Please advise 

## 2018-08-30 ENCOUNTER — Ambulatory Visit (INDEPENDENT_AMBULATORY_CARE_PROVIDER_SITE_OTHER): Payer: BC Managed Care – PPO | Admitting: Physician Assistant

## 2018-08-30 NOTE — Telephone Encounter (Signed)
Can you send this to [place] or billing maybe? Not sure what to do with this.

## 2018-08-30 NOTE — Telephone Encounter (Signed)
Are you able to assist with this?

## 2018-08-30 NOTE — Telephone Encounter (Signed)
Can you please address this Marcie Bal?  Thank you.

## 2018-09-06 ENCOUNTER — Ambulatory Visit (INDEPENDENT_AMBULATORY_CARE_PROVIDER_SITE_OTHER): Payer: Self-pay | Admitting: Psychology

## 2018-09-14 ENCOUNTER — Encounter: Payer: Self-pay | Admitting: Family Medicine

## 2018-09-14 ENCOUNTER — Ambulatory Visit: Payer: BC Managed Care – PPO | Admitting: Professional

## 2018-09-15 NOTE — Telephone Encounter (Signed)
I don't have enough experience with patients taking it It is not harmful

## 2018-10-20 ENCOUNTER — Ambulatory Visit (INDEPENDENT_AMBULATORY_CARE_PROVIDER_SITE_OTHER): Payer: BC Managed Care – PPO

## 2018-10-20 ENCOUNTER — Other Ambulatory Visit: Payer: Self-pay

## 2018-10-20 DIAGNOSIS — Z23 Encounter for immunization: Secondary | ICD-10-CM

## 2018-10-20 NOTE — Progress Notes (Signed)
Flu shot given to patient w/o any complications 

## 2018-11-03 ENCOUNTER — Encounter: Payer: Self-pay | Admitting: Family Medicine

## 2018-11-03 ENCOUNTER — Other Ambulatory Visit: Payer: Self-pay

## 2018-11-04 ENCOUNTER — Other Ambulatory Visit: Payer: Self-pay

## 2018-11-04 ENCOUNTER — Ambulatory Visit: Payer: BC Managed Care – PPO | Admitting: Family Medicine

## 2018-11-04 ENCOUNTER — Encounter: Payer: Self-pay | Admitting: Family Medicine

## 2018-11-04 VITALS — BP 124/64 | HR 73 | Temp 97.9°F | Ht 65.0 in | Wt 224.5 lb

## 2018-11-04 DIAGNOSIS — G5601 Carpal tunnel syndrome, right upper limb: Secondary | ICD-10-CM

## 2018-11-04 DIAGNOSIS — M79641 Pain in right hand: Secondary | ICD-10-CM

## 2018-11-04 MED ORDER — MELOXICAM 15 MG PO TABS: 15.0000 mg | ORAL_TABLET | Freq: Every day | ORAL | 0 refills | Status: DC

## 2018-11-04 NOTE — Progress Notes (Signed)
Musculoskeletal Exam  Patient: Kathy [name] DOB: [birth year]  DOS: 11/04/2018  SUBJECTIVE:  Chief Complaint:   Chief Complaint  Patient presents with  . Hand Pain    right thumb    Kathy [name] is a 43 y.o.  female for evaluation and treatment of R hand pain.   Onset:  1 week ago. No inj or change in activity.  Location: Base of thumb/hand Character:  stabbing  Progression of issue:  has worsened Associated symptoms: decreased Rom Treatment: to date has been rest and Ace bandage.   Neurovascular symptoms: no  Also notes she will have tingling in her R hand when she wakes up. Hx of CTS when she was preg 8 yrs ago. Feels similar to that. She is not dropping things. Has not tried anything at home.  She was given a splint in the past but is not currently using it as she does not know its whereabouts.  ROS: Musculoskeletal/Extremities: +R hand pain Neuro: +tingling at night  Past Medical History:  Diagnosis Date  . Anxiety   . Arthritis   . Back pain   . Chest pain   . Dizziness   . Fatigue   . Generalized headaches   . Hand tingling   . Hip pain   . HLD (hyperlipidemia)   . HSV (herpes simplex virus) infection   . Knee pain   . Multiple food allergies    Avocado, Banana, [name], [name], sometimes apples:  Nausea, vomitting itching in throat  . Palpitations   . Postpartum care following vaginal delivery 10/05/2010    Objective: VITAL SIGNS: BP 124/64 (BP Location: Left Arm, Patient Position: Sitting, Cuff Size: Normal)   Pulse 73   Temp 97.9 F (36.6 C) (Temporal)   Ht 5\' 5"  (1.651 m)   Wt 224 lb 8 oz (101.8 kg)   SpO2 97%   BMI 37.36 kg/m  Constitutional: Well formed, well developed. No acute distress. Cardiovascular: Brisk cap refill Thorax & Lungs: No accessory muscle use Musculoskeletal: R hand.   Normal active range of motion: yes.   Normal passive range of motion: yes Tenderness to palpation: yes; over thenar eminence Deformity:  no Ecchymosis: no Neurologic: Normal sensory function. No focal deficits noted. Neg Tinels' Psychiatric: Normal mood. Age appropriate judgment and insight. Alert & oriented x 3.    Assessment:  Pain of right hand - Plan: meloxicam (MOBIC) 15 MG tablet  Carpal tunnel syndrome of right wrist  Plan: 1- Heat, ice, try to stretch area. Thumb spica suggested.  If no improvement will do Occupational Therapy.  If it starts to trigger will inject. 2- Brace at night. If no improvement, will inject.  F/u prn. The patient voiced understanding and agreement to the plan.   [place], DO 11/04/18  12:20 PM

## 2018-11-04 NOTE — Patient Instructions (Addendum)
Ice/cold pack over area for 10-15 min twice daily.  OK to take Tylenol 1000 mg (2 extra strength tabs) or 975 mg (3 regular strength tabs) every 6 hours as needed.  If this does not get better in next month, let me know. Occupational therapy for the hand is the next step. An injection for your carpal tunnel is the next step. See if Dr. Etter Sjogren does these.   Consider a thumb spica splint.   Wear the splint at night.  Let us know if you need anything.

## 2018-11-06 ENCOUNTER — Encounter: Payer: Self-pay | Admitting: Family Medicine

## 2018-11-08 ENCOUNTER — Encounter: Payer: Self-pay | Admitting: Family Medicine

## 2018-11-09 ENCOUNTER — Other Ambulatory Visit: Payer: Self-pay

## 2018-11-09 DIAGNOSIS — Z20828 Contact with and (suspected) exposure to other viral communicable diseases: Secondary | ICD-10-CM

## 2018-11-09 DIAGNOSIS — Z20822 Contact with and (suspected) exposure to covid-19: Secondary | ICD-10-CM

## 2018-11-15 DIAGNOSIS — Z20828 Contact with and (suspected) exposure to other viral communicable diseases: Secondary | ICD-10-CM | POA: Diagnosis not present

## 2018-11-17 ENCOUNTER — Encounter: Payer: Self-pay | Admitting: Family Medicine

## 2018-11-26 DIAGNOSIS — Z20828 Contact with and (suspected) exposure to other viral communicable diseases: Secondary | ICD-10-CM | POA: Diagnosis not present

## 2018-11-30 ENCOUNTER — Other Ambulatory Visit: Payer: Self-pay

## 2018-11-30 ENCOUNTER — Ambulatory Visit (INDEPENDENT_AMBULATORY_CARE_PROVIDER_SITE_OTHER): Payer: BC Managed Care – PPO | Admitting: Family Medicine

## 2018-11-30 ENCOUNTER — Encounter: Payer: Self-pay | Admitting: Family Medicine

## 2018-11-30 VITALS — Temp 98.3°F | Ht 65.0 in | Wt 222.0 lb

## 2018-11-30 DIAGNOSIS — A09 Infectious gastroenteritis and colitis, unspecified: Secondary | ICD-10-CM

## 2018-11-30 MED ORDER — AZITHROMYCIN 250 MG PO TABS: ORAL_TABLET | ORAL | 0 refills | Status: DC

## 2018-11-30 NOTE — Progress Notes (Signed)
Virtual Visit via Video Note  I connected with Kathy [name] on 11/30/18 at  8:20 AM EDT by a video enabled telemedicine application and verified that I am speaking with the correct person using two identifiers.  Location: Patient: home Provider: office    I discussed the limitations of evaluation and management by telemedicine and the availability of in person appointments. The patient expressed understanding and agreed to proceed.  History of Present Illness: Pt is home.  She got back from [place] Thursday.  Sat she had NVD----she has had diarrhea since.  She got a covid test Saturday and Sunday she had fever 100 and took tylenol.  The diarrhea con't  This am it is better but not completely gone covid test was negative     Observations/Objective: Today's Vitals   11/30/18 0826  Temp: 98.3 F (36.8 C)  Weight: 222 lb (100.7 kg)  Height: 5\' 5"  (1.651 m)   Body mass index is 36.94 kg/m. Pt is in NAD  Assessment and Plan: 1. Traveler's diarrhea Can use pepto bismol if needed abx per orders --- call prn  - azithromycin (ZITHROMAX Z-PAK) 250 MG tablet; 4 po qd x 1  Dispense: 4 each; Refill: 0  Brat diet  Slowly advance diet  Call with any questions / concerns  Follow Up Instructions:    I discussed the assessment and treatment plan with the patient. The patient was provided an opportunity to ask questions and all were answered. The patient agreed with the plan and demonstrated an understanding of the instructions.   The patient was advised to call back or seek an in-person evaluation if the symptoms worsen or if the condition fails to improve as anticipated.  I provided 15 minutes of non-face-to-face time during this encounter.   Ann Held, DO

## 2018-12-13 ENCOUNTER — Other Ambulatory Visit: Payer: Self-pay

## 2018-12-13 ENCOUNTER — Ambulatory Visit (INDEPENDENT_AMBULATORY_CARE_PROVIDER_SITE_OTHER): Payer: BC Managed Care – PPO | Admitting: Family Medicine

## 2018-12-13 ENCOUNTER — Encounter: Payer: Self-pay | Admitting: Family Medicine

## 2018-12-13 VITALS — BP 110/60 | HR 76 | Temp 97.5°F | Resp 18 | Ht 65.0 in | Wt 224.0 lb

## 2018-12-13 DIAGNOSIS — Z0000 Encounter for general adult medical examination without abnormal findings: Secondary | ICD-10-CM | POA: Diagnosis not present

## 2018-12-13 DIAGNOSIS — L659 Nonscarring hair loss, unspecified: Secondary | ICD-10-CM | POA: Diagnosis not present

## 2018-12-13 DIAGNOSIS — E559 Vitamin D deficiency, unspecified: Secondary | ICD-10-CM | POA: Diagnosis not present

## 2018-12-13 DIAGNOSIS — N926 Irregular menstruation, unspecified: Secondary | ICD-10-CM

## 2018-12-13 LAB — THYROID PANEL WITH TSH
Free Thyroxine Index: 2.1 (ref 1.4–3.8)
T3 Uptake: 29 % (ref 22–35)
T4, Total: 7.4 ug/dL (ref 5.1–11.9)
TSH: 2.13 mIU/L

## 2018-12-13 LAB — CBC WITH DIFFERENTIAL/PLATELET
Basophils Absolute: 0 10*3/uL (ref 0.0–0.1)
Basophils Relative: 0.5 % (ref 0.0–3.0)
Eosinophils Absolute: 0.1 10*3/uL (ref 0.0–0.7)
Eosinophils Relative: 1.4 % (ref 0.0–5.0)
HCT: 39.7 % (ref 36.0–46.0)
Hemoglobin: 12.9 g/dL (ref 12.0–15.0)
Lymphocytes Relative: 35.7 % (ref 12.0–46.0)
Lymphs Abs: 2 10*3/uL (ref 0.7–4.0)
MCHC: 32.5 g/dL (ref 30.0–36.0)
MCV: 84.6 fl (ref 78.0–100.0)
Monocytes Absolute: 0.4 10*3/uL (ref 0.1–1.0)
Monocytes Relative: 6.3 % (ref 3.0–12.0)
Neutro Abs: 3.2 10*3/uL (ref 1.4–7.7)
Neutrophils Relative %: 56.1 % (ref 43.0–77.0)
Platelets: 181 10*3/uL (ref 150.0–400.0)
RBC: 4.69 Mil/uL (ref 3.87–5.11)
RDW: 13.9 % (ref 11.5–15.5)
WBC: 5.7 10*3/uL (ref 4.0–10.5)

## 2018-12-13 LAB — HCG, SERUM, QUALITATIVE: Preg, Serum: NEGATIVE

## 2018-12-13 LAB — COMPREHENSIVE METABOLIC PANEL
ALT: 15 U/L (ref 0–35)
AST: 16 U/L (ref 0–37)
Albumin: 4.2 g/dL (ref 3.5–5.2)
Alkaline Phosphatase: 43 U/L (ref 39–117)
BUN: 13 mg/dL (ref 6–23)
CO2: 28 mEq/L (ref 19–32)
Calcium: 8.8 mg/dL (ref 8.4–10.5)
Chloride: 103 mEq/L (ref 96–112)
Creatinine, Ser: 0.8 mg/dL (ref 0.40–1.20)
GFR: 95.61 mL/min (ref >60.00)
Glucose, Bld: 95 mg/dL (ref 70–99)
Potassium: 4.2 mEq/L (ref 3.5–5.1)
Sodium: 137 mEq/L (ref 135–145)
Total Bilirubin: 0.6 mg/dL (ref 0.2–1.2)
Total Protein: 6.7 g/dL (ref 6.0–8.3)

## 2018-12-13 LAB — LIPID PANEL
Cholesterol: 170 mg/dL (ref 0–200)
HDL: 50.5 mg/dL (ref >39.00)
LDL Cholesterol: 105 mg/dL — ABNORMAL HIGH (ref 0–99)
NonHDL: 119.43
Total CHOL/HDL Ratio: 3
Triglycerides: 70 mg/dL (ref 0.0–149.0)
VLDL: 14 mg/dL (ref 0.0–40.0)

## 2018-12-13 LAB — VITAMIN B12: Vitamin B-12: 602 pg/mL (ref 211–911)

## 2018-12-13 LAB — VITAMIN D 25 HYDROXY (VIT D DEFICIENCY, FRACTURES): VITD: 48.66 ng/mL (ref 30.00–100.00)

## 2018-12-13 LAB — HEMOGLOBIN A1C: Hgb A1c MFr Bld: 5.6 % (ref 4.6–6.5)

## 2018-12-13 LAB — INSULIN, RANDOM: Insulin: 7.3 u[IU]/mL

## 2018-12-13 NOTE — Progress Notes (Signed)
Subjective:     Kathy [name] is a 43 y.o. female and is here for a comprehensive physical exam. The patient reports problems with hair loss -- she would like her thyroid checked and she c/o knee pain -- she says she has arthritis and she has tendonitis in her R thumb   She will make an app with an ortho.    Social History   Socioeconomic History  . Marital status: Single    Spouse name: Not on file  . Number of children: Not on file  . Years of education: Not on file  . Highest education level: Not on file  Occupational History  . Occupation: Lawyer: [employer]  Social Needs  . Financial resource strain: Not on file  . Food insecurity    Worry: Not on file    Inability: Not on file  . Transportation needs    Medical: Not on file    Non-medical: Not on file  Tobacco Use  . Smoking status: Former Smoker    Packs/day: 1.00    Years: 10.00    Pack years: 10.00    Types: Cigarettes    Quit date: 2007    Years since quitting: 13.8  . Smokeless tobacco: Never Used  Substance and Sexual Activity  . Alcohol use: Yes    Comment: rare on ocassion  . Drug use: No  . Sexual activity: Yes    Partners: Male  Lifestyle  . Physical activity    Days per week: Not on file    Minutes per session: Not on file  . Stress: Not on file  Relationships  . Social Herbalist on phone: Not on file    Gets together: Not on file    Attends religious service: Not on file    Active member of club or organization: Not on file    Attends meetings of clubs or organizations: Not on file    Relationship status: Not on file  . Intimate partner violence    Fear of current or ex partner: Not on file    Emotionally abused: Not on file    Physically abused: Not on file    Forced sexual activity: Not on file  Other Topics Concern  . Not on file  Social History Narrative  . Not on file   Health Maintenance  Topic Date Due  . PAP SMEAR-Modifier  01/02/2017  .  TETANUS/TDAP  10/05/2020  . INFLUENZA VACCINE  Completed  . HIV Screening  Completed    The following portions of the patient's history were reviewed and updated as appropriate:  She  has a past medical history of Anxiety, Arthritis, Back pain, Chest pain, Dizziness, Fatigue, Generalized headaches, Hand tingling, Hip pain, HLD (hyperlipidemia), HSV (herpes simplex virus) infection, Knee pain, Multiple food allergies, Palpitations, and Postpartum care following vaginal delivery (10/05/2010). She does not have any pertinent problems on file. She  has a past surgical history that includes No past surgeries. Her family history includes Breast cancer in her maternal grandmother; Cancer in her maternal grandfather; Diabetes in her maternal grandfather, maternal grandmother, paternal grandfather, and paternal grandmother; Lymphoma in her mother; Obesity in her father. She  reports that she quit smoking about 13 years ago. Her smoking use included cigarettes. She has a 10.00 pack-year smoking history. She has never used smokeless tobacco. She reports current alcohol use. She reports that she does not use drugs. She has a current medication  list which includes the following prescription(s): acetaminophen, alprazolam, ascorbic acid, vitamin d, womens one daily, fish oil, turmeric, valacyclovir, vitamin d (ergocalciferol), and meloxicam. Current Outpatient Medications on File Prior to Visit  Medication Sig Dispense Refill  . acetaminophen (TYLENOL) 650 MG CR tablet Take 650 mg by mouth every 8 (eight) hours as needed for pain.    Marland Kitchen ALPRAZolam (XANAX) 0.5 MG tablet Take 0.5 mg by mouth 2 (two) times daily as needed for anxiety.    . Ascorbic Acid (VITAMIN C PO) Take 1,000 mg by mouth daily.    . Cholecalciferol (VITAMIN D) 125 MCG (5000 UT) CAPS Take 1 capsule by mouth daily.    . Multiple Vitamins-Minerals (WOMENS ONE DAILY) TABS Take 1 tablet by mouth daily.    . Omega-3 Fatty Acids (FISH OIL) 1200 MG CAPS  Take by mouth.    . Turmeric (QC TUMERIC COMPLEX PO) Take by mouth. Pt states taking 2250 MG of Tumeric    . valACYclovir (VALTREX) 500 MG tablet Take 500 mg by mouth daily as needed.     . Vitamin D, Ergocalciferol, (DRISDOL) 1.25 MG (50000 UT) CAPS capsule Take 1 capsule (50,000 Units total) by mouth every 7 (seven) days. 4 capsule 0  . meloxicam (MOBIC) 15 MG tablet Take 1 tablet (15 mg total) by mouth daily. (Patient not taking: Reported on 12/13/2018) 30 tablet 0   No current facility-administered medications on file prior to visit.    She has No Known Allergies..  Review of Systems Review of Systems  Constitutional: Negative for activity change, appetite change and fatigue.  HENT: Negative for hearing loss, congestion, tinnitus and ear discharge.  dentist q44m Eyes: Negative for visual disturbance (see optho q1y -- vision corrected to 20/20 with glasses).  Respiratory: Negative for cough, chest tightness and shortness of breath.   Cardiovascular: Negative for chest pain, palpitations and leg swelling.  Gastrointestinal: Negative for abdominal pain, diarrhea, constipation and abdominal distention.  Genitourinary: Negative for urgency, frequency, decreased urine volume and difficulty urinating.  Musculoskeletal: Negative for back pain, arthralgias and gait problem.  Skin: Negative for color change, pallor and rash.  Neurological: Negative for dizziness, light-headedness, numbness and headaches.  Hematological: Negative for adenopathy. Does not bruise/bleed easily.  Psychiatric/Behavioral: Negative for suicidal ideas, confusion, sleep disturbance, self-injury, dysphoric mood, decreased concentration and agitation.       Objective:    BP 110/60 (BP Location: Right Arm, Patient Position: Sitting, Cuff Size: Large)   Pulse 76   Temp (!) 97.5 F (36.4 C) (Temporal)   Resp 18   Ht 5\' 5"  (1.651 m)   Wt 224 lb (101.6 kg)   LMP 11/08/2018   SpO2 98%   BMI 37.28 kg/m  General  appearance: alert, cooperative, appears stated age and no distress Head: Normocephalic, without obvious abnormality, atraumatic Eyes: conjunctivae/corneas clear. PERRL, EOM's intact. Fundi benign. Ears: normal TM's and external ear canals both ears Nose: Nares normal. Septum midline. Mucosa normal. No drainage or sinus tenderness. Throat: lips, mucosa, and tongue normal; teeth and gums normal Neck: no adenopathy, no carotid bruit, no JVD, supple, symmetrical, trachea midline and thyroid not enlarged, symmetric, no tenderness/mass/nodules Back: symmetric, no curvature. ROM normal. No CVA tenderness. Lungs: clear to auscultation bilaterally Breasts: gyn Heart: regular rate and rhythm, S1, S2 normal, no murmur, click, rub or gallop Abdomen: soft, non-tender; bowel sounds normal; no masses,  no organomegaly Pelvic: deferred--gyn Extremities: extremities normal, atraumatic, no cyanosis or edema Pulses: 2+ and symmetric Skin: Skin color, texture,  turgor normal. No rashes or lesions or normal Lymph nodes: Cervical, supraclavicular, and axillary nodes normal. Neurologic: Alert and oriented X 3, normal strength and tone. Normal symmetric reflexes. Normal coordination and gait   Assessment:    Healthy female exam.      Plan:     ghm utd Check labs  See After Visit Summary for Counseling Recommendations    1. Vitamin D deficiency Check labs  - Vitamin D (25 hydroxy) - Thyroid Panel With TSH  2. Morbid obesity (HCC) Pt requesting labs Had to stop healthy weight and wellness due to cost and covid  - Vitamin B12  3. Preventative health care ghm utd Check labs  See AVS - CBC with Differential - Lipid panel - Comprehensive metabolic panel - Vitamin D (25 hydroxy) - Insulin, random - Hemoglobin A1c  4. Hair loss Check thyroid panel  - Vitamin B12  5. Late menses Pt requesting preg test  - hCG, serum, qualitative (PREGNANCY TEST YES OR NO)

## 2018-12-13 NOTE — Patient Instructions (Addendum)

## 2018-12-18 ENCOUNTER — Encounter: Payer: Self-pay | Admitting: Family Medicine

## 2018-12-19 NOTE — Telephone Encounter (Signed)
Insulin dropping is a good things a1c  Below 6 is what we want ---  Hers is prediabetic  Watch simple sugars and starches

## 2018-12-20 NOTE — Telephone Encounter (Signed)
Wbc was normal

## 2018-12-20 NOTE — Telephone Encounter (Signed)
Any comment on the WBC count she was asking about?

## 2019-01-12 ENCOUNTER — Encounter: Payer: Self-pay | Admitting: Family Medicine

## 2019-01-16 ENCOUNTER — Encounter: Payer: Self-pay | Admitting: Family Medicine

## 2019-01-16 ENCOUNTER — Other Ambulatory Visit: Payer: Self-pay

## 2019-01-16 ENCOUNTER — Ambulatory Visit: Payer: BC Managed Care – PPO | Admitting: Family Medicine

## 2019-01-16 VITALS — BP 118/68 | HR 71 | Temp 97.5°F | Resp 18 | Ht 65.0 in | Wt 229.2 lb

## 2019-01-16 DIAGNOSIS — L659 Nonscarring hair loss, unspecified: Secondary | ICD-10-CM | POA: Insufficient documentation

## 2019-01-16 NOTE — Patient Instructions (Signed)
Alopecia Areata, Adult  Alopecia areata is a condition that causes you to lose hair. You may lose hair on your scalp in patches. In some cases, you may lose all the hair on your scalp (alopecia totalis) or all the hair from your face and body (alopecia universalis). Alopecia areata is an autoimmune disease. This means that your body's defense system (immune system) mistakes normal parts of the body for germs or other things that can make you sick. When you have alopecia areata, the immune system attacks the hair follicles. Alopecia areata usually develops in childhood, but it can develop at any age. For some people, their hair grows back on its own and hair loss does not happen again. For others, their hair may fall out and grow back in cycles. The hair loss may last many years. Having this condition can be emotionally difficult, but it is not dangerous. What are the causes? The cause of this condition is not known. What increases the risk? This condition is more likely to develop in people who have:  A family history of alopecia.  A family history of another autoimmune disease, including type 1 diabetes and rheumatoid arthritis.  Asthma and allergies.  Down syndrome. What are the signs or symptoms? Round spots of patchy hair loss on the scalp is the main symptom of this condition. The spots may be mildly itchy. Other symptoms include:  Short dark hairs in the bald patches that are wider at the top (exclamation point hairs).  Dents, white spots, or lines in the fingernails or toenails.  Balding and body hair loss. This is rare. How is this diagnosed? This condition is diagnosed based on your symptoms and family history. Your health care provider will also check your scalp skin, teeth, and nails. Your health care provider may refer you to a specialist in hair and skin disorders (dermatologist). You may also have tests, including:  A hair pull test.  Blood tests or other screening tests  to check for autoimmune diseases, such as thyroid disease or diabetes.  Skin biopsy to confirm the diagnosis.  A procedure to examine the skin with a lighted magnifying instrument (dermoscopy). How is this treated? There is no cure for alopecia areata. Treatment is aimed at promoting the regrowth of hair and preventing the immune system from overreacting. No single treatment is right for all people with alopecia areata. It depends on the type of hair loss you have and how severe it is. Work with your health care provider to find the best treatment for you. Treatment may include:  Having regular checkups to make sure the condition is not getting worse (watchful waiting).  Steroid creams or pills for 6-8 weeks to stop the immune reaction and help hair to regrow more quickly.  Other topical medicines to alter the immune system response and support the hair growth cycle.  Steroid injections.  Therapy and counseling with a support group or therapist if you are having trouble coping with hair loss. Follow these instructions at home:  Learn as much as you can about your condition.  Apply topical creams only as told by your health care provider.  Take over-the-counter and prescription medicines only as told by your health care provider.  Consider getting a wig or products to make hair look fuller or to cover bald spots, if you feel uncomfortable with your appearance.  Get therapy or counseling if you are having a hard time coping with hair loss. Ask your health care provider to recommend   a counselor or support group.  Keep all follow-up visits as told by your health care provider. This is important. Contact a health care provider if:  Your hair loss gets worse, even with treatment.  You have new symptoms.  You are struggling emotionally. Summary  Alopecia areata is an autoimmune condition that makes your body's defense system (immune system) attack the hair follicles. This causes you  to lose hair.  Treatments may include regular checkups to make sure that the condition is not getting worse (watchful waiting), medicines, and steroid injections. This information is not intended to replace advice given to you by your health care provider. Make sure you discuss any questions you have with your health care provider. Document Released: 08/24/2003 Document Revised: 01/01/2017 Document Reviewed: 02/07/2016 Elsevier Patient Education  2020 [employer]  

## 2019-01-16 NOTE — Progress Notes (Signed)
Patient ID: Kathy [name] Popp, female    DOB: September 30, 1975  Age: 43 y.o. MRN: [id number]    Subjective:  Subjective  HPI Kathy [name] Knecht presents for hair loss --- hair comes out everyday in clumps  She states she is also c/o struggling to lose weight and she would like to discuss this more.  Review of Systems  Constitutional: Positive for unexpected weight change. Negative for appetite change, diaphoresis and fatigue.  Eyes: Negative for pain, redness and visual disturbance.  Respiratory: Negative for cough, chest tightness, shortness of breath and wheezing.   Cardiovascular: Negative for chest pain, palpitations and leg swelling.  Endocrine: Negative for cold intolerance, heat intolerance, polydipsia, polyphagia and polyuria.  Genitourinary: Negative for difficulty urinating, dysuria and frequency.  Neurological: Negative for dizziness, light-headedness, numbness and headaches.    History Past Medical History:  Diagnosis Date   Anxiety    Arthritis    Back pain    Chest pain    Dizziness    Fatigue    Generalized headaches    Hand tingling    Hip pain    HLD (hyperlipidemia)    HSV (herpes simplex virus) infection    Knee pain    Multiple food allergies    Avocado, Banana, [name], [name], sometimes apples:  Nausea, vomitting itching in throat   Palpitations    Postpartum care following vaginal delivery 10/05/2010    She has a past surgical history that includes No past surgeries.   Her family history includes Breast cancer in her maternal grandmother; Cancer in her maternal grandfather; Diabetes in her maternal grandfather, maternal grandmother, paternal grandfather, and paternal grandmother; Lymphoma in her mother; Obesity in her father.She reports that she quit smoking about 13 years ago. Her smoking use included cigarettes. She has a 10.00 pack-year smoking history. She has never used smokeless tobacco. She reports current alcohol use. She reports that  she does not use drugs.  Current Outpatient Medications on File Prior to Visit  Medication Sig Dispense Refill   acetaminophen (TYLENOL) 650 MG CR tablet Take 650 mg by mouth every 8 (eight) hours as needed for pain.     ALPRAZolam (XANAX) 0.5 MG tablet Take 0.5 mg by mouth 2 (two) times daily as needed for anxiety.     Ascorbic Acid (VITAMIN C PO) Take 1,000 mg by mouth daily.     Cholecalciferol (VITAMIN D) 125 MCG (5000 UT) CAPS Take 1 capsule by mouth daily.     Multiple Vitamins-Minerals (WOMENS ONE DAILY) TABS Take 1 tablet by mouth daily.     Omega-3 Fatty Acids (FISH OIL) 1200 MG CAPS Take by mouth.     Turmeric (QC TUMERIC COMPLEX PO) Take by mouth. Pt states taking 2250 MG of Tumeric     valACYclovir (VALTREX) 500 MG tablet Take 500 mg by mouth daily as needed.      Vitamin D, Ergocalciferol, (DRISDOL) 1.25 MG (50000 UT) CAPS capsule Take 1 capsule (50,000 Units total) by mouth every 7 (seven) days. 4 capsule 0   No current facility-administered medications on file prior to visit.     Objective:  Objective  Physical Exam Vitals and nursing note reviewed.  Constitutional:      Appearance: She is well-developed.  HENT:     Head: Normocephalic and atraumatic.  Eyes:     Conjunctiva/sclera: Conjunctivae normal.  Neck:     Thyroid: No thyromegaly.     Vascular: No carotid bruit or JVD.  Cardiovascular:     Rate  and Rhythm: Normal rate and regular rhythm.     Heart sounds: Normal heart sounds. No murmur.  Pulmonary:     Effort: Pulmonary effort is normal. No respiratory distress.     Breath sounds: Normal breath sounds. No wheezing or rales.  Chest:     Chest wall: No tenderness.  Musculoskeletal:     Cervical back: Normal range of motion and neck supple.  Neurological:     Mental Status: She is alert and oriented to person, place, and time.    BP 118/68 (BP Location: Left Arm, Patient Position: Sitting, Cuff Size: Normal)    Pulse 71    Temp (!) 97.5 F (36.4  C) (Temporal)    Resp 18    Ht  (1.651 m)    Wt 229 lb 3.2 oz (104 kg)    SpO2 98%    BMI 38.14 kg/m  Wt Readings from Last 3 Encounters:  01/16/19 229 lb 3.2 oz (104 kg)  12/13/18 224 lb (101.6 kg)  11/30/18 222 lb (100.7 kg)     Lab Results  Component Value Date   WBC 5.7 12/13/2018   HGB 12.9 12/13/2018   HCT 39.7 12/13/2018   PLT 181.0 12/13/2018   GLUCOSE 95 12/13/2018   CHOL 170 12/13/2018   TRIG 70.0 12/13/2018   HDL 50.50 12/13/2018   LDLCALC 105 (H) 12/13/2018   ALT 15 12/13/2018   AST 16 12/13/2018   NA 137 12/13/2018   K 4.2 12/13/2018   CL 103 12/13/2018   CREATININE 0.80 12/13/2018   BUN 13 12/13/2018   CO2 28 12/13/2018   TSH 2.13 12/13/2018   INR 1.0 11/26/2016   HGBA1C 5.6 12/13/2018    ECHOCARDIOGRAM COMPLETE  Result Date: 03/08/2017                       [facility]*                       [address]                        [place], [place] [address]                            [phone] ------------------------------------------------------------------- Transthoracic Echocardiography Patient:    Kathy, [name] MR #:       [id number] Study Date: 03/08/2017 Gender:     F Age:        49 Height:     165.1 cm Weight:     94.2 kg BSA:        2.12 m^2 Pt. Status: Room:  PERFORMING   [facility] [place] [facility]  SONOGRAPHER  [place], RDCS  ATTENDING    Zola Button, [place]ling [place]  ORDERING     [place]elson, Myrene Buddy R  REFERRING    [place] R cc: ------------------------------------------------------------------- LV EF: 60% -   65% ------------------------------------------------------------------- Indications:      Chest pain 786.51. ------------------------------------------------------------------- History:   PMH:   Palpitations. ------------------------------------------------------------------- Study Conclusions - Left ventricle: The cavity size was normal. Systolic function was   normal. The estimated ejection fraction was in the range of  60%   to 65%. Wall motion was normal; there were no regional wall   motion abnormalities. - Aortic valve: Valve area (VTI): 3.05 cm^2. Valve area (Vmax):   3.39 cm^2.  Valve area (Vmean): 3.12 cm^2. Impressions: - 1. Left ventricular systolic function is preserved visually   estimated at 60-65%. Study is otherwise unremarkable. ------------------------------------------------------------------- Study data:  No prior study was available for comparison.  Study status:  Routine.  Procedure:  The patient reported no pain pre or post test. Transthoracic echocardiography. Image quality was adequate.  Study completion:  There were no complications. Transthoracic echocardiography.  M-mode, complete 2D, spectral Doppler, and color Doppler.  Birthdate:  Patient birthdate: Jul 26, 1975.  Age:  Patient is 43 yr old.  Sex:  Gender: female. BMI: 34.5 kg/m^2.  Blood pressure:     120/70  Patient status: Outpatient.  Study date:  Study date: 03/08/2017. Study time: 03:22 PM.  Location:  [facility]. ------------------------------------------------------------------- ------------------------------------------------------------------- Left ventricle:  The cavity size was normal. Systolic function was normal. The estimated ejection fraction was in the range of 60% to 65%. Wall motion was normal; there were no regional wall motion abnormalities. ------------------------------------------------------------------- Aortic valve:   Trileaflet; normal thickness leaflets. Mobility was not restricted.  Doppler:  Transvalvular velocity was within the normal range. There was no stenosis. There was no regurgitation. VTI ratio of LVOT to aortic valve: 0.74. Valve area (VTI): 3.05 cm^2. Indexed valve area (VTI): 1.44 cm^2/m^2. Peak velocity ratio of LVOT to aortic valve: 0.82. Valve area (Vmax): 3.39 cm^2. Indexed valve area (Vmax): 1.6 cm^2/m^2. Mean velocity ratio of LVOT to aortic valve: 0.75. Valve area (Vmean): 3.12 cm^2. Indexed valve area  (Vmean): 1.47 cm^2/m^2.    Mean gradient (S): 4 mm Hg. Peak gradient (S): 6 mm Hg. ------------------------------------------------------------------- Aorta:  Aortic root: The aortic root was normal in size. ------------------------------------------------------------------- Mitral valve:   Structurally normal valve.   Mobility was not restricted.  Doppler:  Transvalvular velocity was within the normal range. There was no evidence for stenosis. There was no regurgitation.    Peak gradient (D): 4 mm Hg. ------------------------------------------------------------------- Left atrium:  The atrium was normal in size. ------------------------------------------------------------------- Right ventricle:  The cavity size was normal. Wall thickness was normal. Systolic function was normal. ------------------------------------------------------------------- Pulmonic valve:   Not well visualized.  Doppler:  Transvalvular velocity was within the normal range. There was no evidence for stenosis. ------------------------------------------------------------------- Tricuspid valve:   Structurally normal valve.    Doppler: Transvalvular velocity was within the normal range. There was no regurgitation. ------------------------------------------------------------------- Pulmonary artery:   The main pulmonary artery was normal-sized. Systolic pressure was within the normal range. ------------------------------------------------------------------- Right atrium:  The atrium was normal in size. ------------------------------------------------------------------- Pericardium:  There was no pericardial effusion. ------------------------------------------------------------------- Systemic veins: Inferior vena cava: The vessel was normal in size. ------------------------------------------------------------------- Measurements  Left ventricle                           Value          Reference  LV ID, ED, PLAX chordal                  48.6  mm        43 - 52  LV ID, ES, PLAX chordal                  32.3  mm       23 - 38  LV fx shortening, PLAX chordal           34    %        >=29  LV PW thickness, ED  9.24  mm       ----------  IVS/LV PW ratio, ED                      1              <=1.3  Stroke volume, 2D                        93    ml       ----------  Stroke volume/bsa, 2D                    44    ml/m^2   ----------  LV e&', lateral                           14.5  cm/s     ----------  LV E/e&', lateral                         6.67           ----------  LV e&', medial                            8.49  cm/s     ----------  LV E/e&', medial                          11.39          ----------  LV e&', average                           11.5  cm/s     ----------  LV E/e&', average                         8.41           ----------  Longitudinal strain, TDI                 25    %        ----------   Ventricular septum                       Value          Reference  IVS thickness, ED                        9.23  mm       ----------   LVOT                                     Value          Reference  LVOT ID, S                               23    mm       ----------  LVOT area                                4.15  cm^2     ----------  LVOT peak velocity, S                    99.8  cm/s     ----------  LVOT mean velocity, S                    67.8  cm/s     ----------  LVOT VTI, S                              22.3  cm       ----------   Aortic valve                             Value          Reference  Aortic valve peak velocity, S            122   cm/s     ----------  Aortic valve mean velocity, S            90.2  cm/s     ----------  Aortic valve VTI, S                      30.3  cm       ----------  Aortic mean gradient, S                  4     mm Hg    ----------  Aortic peak gradient, S                  6     mm Hg    ----------  VTI ratio, LVOT/AV                       0.74           ----------  Aortic valve area, VTI                    3.05  cm^2     ----------  Aortic valve area/bsa, VTI               1.44  cm^2/m^2 ----------  Velocity ratio, peak, LVOT/AV            0.82           ----------  Aortic valve area, peak velocity         3.39  cm^2     ----------  Aortic valve area/bsa, peak              1.6   cm^2/m^2 ----------  velocity  Velocity ratio, mean, LVOT/AV            0.75           ----------  Aortic valve area, mean velocity         3.12  cm^2     ----------  Aortic valve area/bsa, mean              1.47  cm^2/m^2 ----------  velocity   Aorta                                    Value          Reference  Aortic root ID, ED  34    mm       ----------  Ascending aorta ID, A-P, S               32    mm       ----------   Left atrium                              Value          Reference  LA ID, A-P, ES                           34    mm       ----------  LA ID/bsa, A-P                           1.61  cm/m^2   <=2.2  LA volume, S                             48.5  ml       ----------  LA volume/bsa, S                         22.9  ml/m^2   ----------  LA volume, ES, 1-p A4C                   44.1  ml       ----------  LA volume/bsa, ES, 1-p A4C               20.8  ml/m^2   ----------  LA volume, ES, 1-p A2C                   50.5  ml       ----------  LA volume/bsa, ES, 1-p A2C               23.9  ml/m^2   ----------   Mitral valve                             Value          Reference  Mitral E-wave peak velocity              96.7  cm/s     ----------  Mitral A-wave peak velocity              64.3  cm/s     ----------  Mitral deceleration time                 201   ms       150 - 230  Mitral peak gradient, D                  4     mm Hg    ----------  Mitral E/A ratio, peak                   1.5            ----------   Right atrium                             Value  Reference  RA ID, S-I, ES, A4C               (H)    56.9  mm       34 - 49  RA area, ES, A4C                         18.7  cm^2     8.3 - 19.5  RA  volume, ES, A/L                       51.9  ml       ----------  RA volume/bsa, ES, A/L                   24.5  ml/m^2   ----------   Systemic veins                           Value          Reference  Estimated CVP                            3     mm Hg    ----------   Right ventricle                          Value          Reference  TAPSE                                    24.4  mm       ----------  RV s&', lateral, S                        14    cm/s     ---------- Legend: (L)  and  (H)  mark values outside specified reference range. ------------------------------------------------------------------- Prepared and Electronically Authenticated by Belva Crome, MD 2019-02-04T16:36:19    Assessment & Plan:  Plan  I have discontinued Nalayah [name]. Garraway's meloxicam. I am also having her maintain her valACYclovir, ALPRAZolam, acetaminophen, Womens One Daily, Vitamin D, Vitamin D (Ergocalciferol), Fish Oil, Turmeric (QC TUMERIC COMPLEX PO), and Ascorbic Acid (VITAMIN C PO).  No orders of the defined types were placed in this encounter.   Problem List Items Addressed This Visit      Unprioritized   Hair loss - Primary   Relevant Orders   Ambulatory referral to Dermatology   Antinuclear Antib (ANA)   Morbid obesity (HCC)       Check labs Pt is taking b vitamins Eating protein  Eating enough cal  Refer to derm  Recheck ANA  rto prn  Face to face 25 min --- with >50% talking about hair loss, diet and derm referral  2.  Obesity=== pt did not want to go to healthy weight and wellness due to cost She will call her ins co to see if they will pay for saxenda  Follow-up: Return if symptoms worsen or fail to improve.  Donato Schultz, DO

## 2019-01-19 LAB — ANA: Anti Nuclear Antibody (ANA): NEGATIVE

## 2019-01-29 ENCOUNTER — Encounter: Payer: Self-pay | Admitting: Family Medicine

## 2019-01-30 ENCOUNTER — Encounter: Payer: Self-pay | Admitting: Family Medicine

## 2019-02-07 DIAGNOSIS — Z118 Encounter for screening for other infectious and parasitic diseases: Secondary | ICD-10-CM | POA: Diagnosis not present

## 2019-02-07 DIAGNOSIS — Z6838 Body mass index (BMI) 38.0-38.9, adult: Secondary | ICD-10-CM | POA: Diagnosis not present

## 2019-02-07 DIAGNOSIS — Z1159 Encounter for screening for other viral diseases: Secondary | ICD-10-CM | POA: Diagnosis not present

## 2019-02-07 DIAGNOSIS — B373 Candidiasis of vulva and vagina: Secondary | ICD-10-CM | POA: Diagnosis not present

## 2019-02-07 DIAGNOSIS — N760 Acute vaginitis: Secondary | ICD-10-CM | POA: Diagnosis not present

## 2019-02-07 DIAGNOSIS — Z113 Encounter for screening for infections with a predominantly sexual mode of transmission: Secondary | ICD-10-CM | POA: Diagnosis not present

## 2019-02-07 DIAGNOSIS — Z01419 Encounter for gynecological examination (general) (routine) without abnormal findings: Secondary | ICD-10-CM | POA: Diagnosis not present

## 2019-02-07 DIAGNOSIS — Z114 Encounter for screening for human immunodeficiency virus [HIV]: Secondary | ICD-10-CM | POA: Diagnosis not present

## 2019-02-17 DIAGNOSIS — Z1231 Encounter for screening mammogram for malignant neoplasm of breast: Secondary | ICD-10-CM | POA: Diagnosis not present

## 2019-02-23 DIAGNOSIS — L659 Nonscarring hair loss, unspecified: Secondary | ICD-10-CM | POA: Diagnosis not present

## 2019-02-23 DIAGNOSIS — L658 Other specified nonscarring hair loss: Secondary | ICD-10-CM | POA: Diagnosis not present

## 2019-02-23 DIAGNOSIS — L650 Telogen effluvium: Secondary | ICD-10-CM | POA: Diagnosis not present

## 2019-03-20 DIAGNOSIS — Z30430 Encounter for insertion of intrauterine contraceptive device: Secondary | ICD-10-CM | POA: Diagnosis not present

## 2019-03-20 DIAGNOSIS — N854 Malposition of uterus: Secondary | ICD-10-CM | POA: Diagnosis not present

## 2019-04-12 ENCOUNTER — Ambulatory Visit: Payer: BC Managed Care – PPO | Attending: Internal Medicine

## 2019-04-12 DIAGNOSIS — Z20822 Contact with and (suspected) exposure to covid-19: Secondary | ICD-10-CM | POA: Diagnosis not present

## 2019-04-13 LAB — NOVEL CORONAVIRUS, NAA: SARS-CoV-2, NAA: NOT DETECTED

## 2019-04-17 ENCOUNTER — Ambulatory Visit: Payer: BC Managed Care – PPO | Attending: Internal Medicine

## 2019-04-17 DIAGNOSIS — Z20822 Contact with and (suspected) exposure to covid-19: Secondary | ICD-10-CM

## 2019-04-18 LAB — NOVEL CORONAVIRUS, NAA: SARS-CoV-2, NAA: NOT DETECTED

## 2019-04-20 DIAGNOSIS — Z30431 Encounter for routine checking of intrauterine contraceptive device: Secondary | ICD-10-CM | POA: Diagnosis not present

## 2019-05-16 ENCOUNTER — Encounter: Payer: Self-pay | Admitting: Family Medicine

## 2019-05-21 ENCOUNTER — Encounter: Payer: Self-pay | Admitting: Family Medicine

## 2019-05-22 NOTE — Telephone Encounter (Signed)
I do not ----- too many possible side effects --- including heart attack

## 2019-05-29 ENCOUNTER — Ambulatory Visit: Payer: BC Managed Care – PPO | Admitting: Family Medicine

## 2019-05-29 ENCOUNTER — Other Ambulatory Visit: Payer: Self-pay

## 2019-05-29 ENCOUNTER — Ambulatory Visit: Payer: BC Managed Care – PPO | Attending: Internal Medicine

## 2019-05-29 ENCOUNTER — Encounter: Payer: Self-pay | Admitting: Family Medicine

## 2019-05-29 VITALS — BP 118/86 | Ht 65.0 in | Wt 240.0 lb

## 2019-05-29 DIAGNOSIS — M7711 Lateral epicondylitis, right elbow: Secondary | ICD-10-CM

## 2019-05-29 DIAGNOSIS — Z20822 Contact with and (suspected) exposure to covid-19: Secondary | ICD-10-CM | POA: Diagnosis not present

## 2019-05-29 HISTORY — DX: Lateral epicondylitis, right elbow: M77.11

## 2019-05-29 NOTE — Patient Instructions (Signed)
You have lateral epicondylitis Try to avoid painful activities as much as possible. Ice the area 3-4 times a day for 15 minutes at a time. Tylenol or aleve as needed for pain. Counterforce brace as directed can help unload area - wear this regularly if it provides you with relief. Hammer rotation exercise, wrist extension exercise with 1 pound weight - 3 sets of 10 once a day.   Stretching - hold for 20-30 seconds and repeat 3 times. Consider physical therapy, nitro patches if not improving. Follow up in 6 weeks.

## 2019-05-29 NOTE — Progress Notes (Signed)
    SUBJECTIVE:   CHIEF COMPLAINT / HPI:   Right elbow pain Kathy [name] reports that at the end of February, she began to have right elbow pain after doing a workout including tricep extensions.  She has tried to no longer do these types of workouts and to avoid other activities that cause her pain, but her pain has not improved over the last 2 months.  She says that she has difficulties with any movement involving wrist extension or twisting of the wrist.  She has not tried icing the area but does take ibuprofen and Tylenol for other aches and pains.  She denies any new numbness and tingling of the right upper extremity.  She also denies swelling or bruising of the elbow.  She has never had these symptoms before.  PERTINENT  PMH / PSH: Primary osteoarthritis of both hands, hypermobility arthralgia  OBJECTIVE:   BP 118/86   Ht 5\' 5"  (1.651 m)   Wt 240 lb (108.9 kg)   BMI 39.94 kg/m   General: well appearing, appears stated age Right upper extremity: No visual abnormality.  Tender to palpation at the insertion of the extensor tendons of the forearm near the lateral epicondyle.  Nontender to palpation along the medial epicondyle or along the radial nerve.  Normal ROM on elbow flexion and extension.  Pain on resisted extension of the wrist and supination, slightly less pain on resisted extension of the third digit.  Neurovascularly intact.  : No bony abnormality of the right elbow.  Hypoechoic change within common extensor tendon.  No other abnormalities noted.  ASSESSMENT/PLAN:   Lateral epicondylitis of right elbow History and physical exam support this diagnosis, likely caused by irritation due to tricep extension exercises.  Patient was prescribed home exercises to rehab the extensor tendons of the forearm and was given a counterforce brace.  She will follow up in 1 to 2 months if needed.     [place], MD [facility]

## 2019-05-29 NOTE — Assessment & Plan Note (Signed)
History and physical exam support this diagnosis, likely caused by irritation due to tricep extension exercises.  Patient was prescribed home exercises to rehab the extensor tendons of the forearm and was given a counterforce brace.  She will follow up in 1 to 2 months if needed.

## 2019-05-30 LAB — NOVEL CORONAVIRUS, NAA: SARS-CoV-2, NAA: NOT DETECTED

## 2019-05-30 LAB — SARS-COV-2, NAA 2 DAY TAT

## 2019-07-10 ENCOUNTER — Encounter: Payer: Self-pay | Admitting: Family Medicine

## 2019-07-10 ENCOUNTER — Other Ambulatory Visit: Payer: Self-pay

## 2019-07-10 ENCOUNTER — Ambulatory Visit: Payer: BC Managed Care – PPO | Admitting: Family Medicine

## 2019-07-10 DIAGNOSIS — M7711 Lateral epicondylitis, right elbow: Secondary | ICD-10-CM

## 2019-07-10 NOTE — Progress Notes (Signed)
   Kathy [name] is a 44 y.o. female who presents to [facility] today for the following:  Right elbow pain f/u  Patient presenting for R elbow pain f/u. Was seen by Dr. Sherle Poe on 4/26 and diagnosed with lateral epicondylitis. Was instructed at that time to do home exercises and follow up in 1-2 months.   Patient reports she is still having pain, especially when trying to pick things up. States she previously had pain when trying to open jars but this is resolved.  States that she has been using her brace which helps.  Has not been doing home exercises much.  Has not tried Voltaren gel or any other over-the-counter pills.  Is not using any medications.  Is not using heat or ice.  States that the pain is the same and has not improved or worsened.  PMH reviewed. Osteoarthritis of hands bilaterally, hypermobility arthralgia ROS as above. Medications reviewed.  Exam:  BP 112/72   Ht 5\' 5"  (1.651 m)   Wt 228 lb (103.4 kg)   BMI 37.94 kg/m  Gen: Well NAD MSK: Elbow: - Inspection: no obvious deformity. No swelling, erythema or bruising - Palpation: TTP over lateral epicondyle  - ROM: full active ROM in flexion and extension. No crepitus - Strength: 5/5 strength in wrist flexion and extension. Minimal pain with 3rd finger and wrist extension. 5/5 strength in biceps, triceps. - Neuro: NV intact distally   Assessment and Plan: 1) Lateral epicondylitis of right elbow Patient with continued pain secondary to lateral epicondylitis.  Patient has not been compliant on her home exercise program.  Shared decision making with patient and multiple treatment options discussed with her.  We will plan to start physical therapy.  If no improvement can do trial of nitroglycerin patches.  Advised to follow-up if no improvement.   , PGY-3 [facility] Resident  07/10/2019 2:06 PM

## 2019-07-10 NOTE — Assessment & Plan Note (Signed)
Patient with continued pain secondary to lateral epicondylitis.  Patient has not been compliant on her home exercise program.  Shared decision making with patient and multiple treatment options discussed with her.  We will plan to start physical therapy.  If no improvement can do trial of nitroglycerin patches.  Advised to follow-up if no improvement.

## 2019-07-10 NOTE — Patient Instructions (Signed)
You have lateral epicondylitis Try to avoid painful activities as much as possible. Ice the area 3-4 times a day for 15 minutes at a time. Tylenol or aleve as needed for pain. Counterforce brace as directed can help unload area - wear this regularly if it provides you with relief. Start physical therapy. Hammer rotation exercise, wrist extension exercise with 1 pound weight - 3 sets of 10 once a day.   Stretching - hold for 20-30 seconds and repeat 3 times. Consider nitro patches if not improving. Follow up in 6 weeks.

## 2019-07-11 ENCOUNTER — Telehealth: Payer: Self-pay | Admitting: Family Medicine

## 2019-07-11 NOTE — Telephone Encounter (Signed)
Patient called and would like to transfer care to Alysia Penna at [facility] from Seabron Spates at [facility], stating Myrene Buddy did not seem a good fit for her care.  Please let me know if this transfer is approved by you both.

## 2019-07-11 NOTE — Telephone Encounter (Signed)
Fine with me

## 2019-07-11 NOTE — Telephone Encounter (Signed)
ok 

## 2019-07-13 ENCOUNTER — Ambulatory Visit: Payer: BC Managed Care – PPO | Admitting: Nurse Practitioner

## 2019-07-13 DIAGNOSIS — L700 Acne vulgaris: Secondary | ICD-10-CM | POA: Diagnosis not present

## 2019-07-13 DIAGNOSIS — L218 Other seborrheic dermatitis: Secondary | ICD-10-CM | POA: Diagnosis not present

## 2019-07-20 DIAGNOSIS — L659 Nonscarring hair loss, unspecified: Secondary | ICD-10-CM | POA: Diagnosis not present

## 2019-07-20 DIAGNOSIS — E669 Obesity, unspecified: Secondary | ICD-10-CM | POA: Diagnosis not present

## 2019-07-20 DIAGNOSIS — Z6837 Body mass index (BMI) 37.0-37.9, adult: Secondary | ICD-10-CM | POA: Diagnosis not present

## 2019-08-21 ENCOUNTER — Ambulatory Visit: Payer: BC Managed Care – PPO | Admitting: Family Medicine

## 2019-09-12 DIAGNOSIS — L700 Acne vulgaris: Secondary | ICD-10-CM | POA: Diagnosis not present

## 2019-09-12 DIAGNOSIS — L218 Other seborrheic dermatitis: Secondary | ICD-10-CM | POA: Diagnosis not present

## 2019-09-15 DIAGNOSIS — Z6837 Body mass index (BMI) 37.0-37.9, adult: Secondary | ICD-10-CM | POA: Diagnosis not present

## 2019-09-15 DIAGNOSIS — E669 Obesity, unspecified: Secondary | ICD-10-CM | POA: Diagnosis not present

## 2019-09-15 DIAGNOSIS — Z30431 Encounter for routine checking of intrauterine contraceptive device: Secondary | ICD-10-CM | POA: Diagnosis not present

## 2019-10-16 DIAGNOSIS — Z1159 Encounter for screening for other viral diseases: Secondary | ICD-10-CM | POA: Diagnosis not present

## 2019-10-16 DIAGNOSIS — Z113 Encounter for screening for infections with a predominantly sexual mode of transmission: Secondary | ICD-10-CM | POA: Diagnosis not present

## 2019-10-16 DIAGNOSIS — Z118 Encounter for screening for other infectious and parasitic diseases: Secondary | ICD-10-CM | POA: Diagnosis not present

## 2019-10-16 DIAGNOSIS — Z114 Encounter for screening for human immunodeficiency virus [HIV]: Secondary | ICD-10-CM | POA: Diagnosis not present

## 2019-11-02 ENCOUNTER — Other Ambulatory Visit: Payer: BC Managed Care – PPO

## 2019-11-02 DIAGNOSIS — Z20822 Contact with and (suspected) exposure to covid-19: Secondary | ICD-10-CM | POA: Diagnosis not present

## 2019-11-03 LAB — NOVEL CORONAVIRUS, NAA: SARS-CoV-2, NAA: NOT DETECTED

## 2019-11-03 LAB — SARS-COV-2, NAA 2 DAY TAT

## 2019-12-19 ENCOUNTER — Encounter: Payer: Self-pay | Admitting: Nurse Practitioner

## 2019-12-19 ENCOUNTER — Other Ambulatory Visit: Payer: Self-pay

## 2019-12-19 ENCOUNTER — Ambulatory Visit (INDEPENDENT_AMBULATORY_CARE_PROVIDER_SITE_OTHER): Payer: BC Managed Care – PPO | Admitting: Nurse Practitioner

## 2019-12-19 VITALS — BP 112/60 | HR 68 | Temp 96.5°F | Ht 65.25 in | Wt 220.0 lb

## 2019-12-19 DIAGNOSIS — E559 Vitamin D deficiency, unspecified: Secondary | ICD-10-CM | POA: Diagnosis not present

## 2019-12-19 DIAGNOSIS — Z136 Encounter for screening for cardiovascular disorders: Secondary | ICD-10-CM | POA: Diagnosis not present

## 2019-12-19 DIAGNOSIS — Z6836 Body mass index (BMI) 36.0-36.9, adult: Secondary | ICD-10-CM

## 2019-12-19 DIAGNOSIS — Z0000 Encounter for general adult medical examination without abnormal findings: Secondary | ICD-10-CM

## 2019-12-19 DIAGNOSIS — E6609 Other obesity due to excess calories: Secondary | ICD-10-CM

## 2019-12-19 DIAGNOSIS — Z13220 Encounter for screening for lipoid disorders: Secondary | ICD-10-CM | POA: Diagnosis not present

## 2019-12-19 DIAGNOSIS — Z23 Encounter for immunization: Secondary | ICD-10-CM | POA: Diagnosis not present

## 2019-12-19 LAB — CBC WITH DIFFERENTIAL/PLATELET
Basophils Absolute: 0 10*3/uL (ref 0.0–0.1)
Basophils Relative: 0.5 % (ref 0.0–3.0)
Eosinophils Absolute: 0.1 10*3/uL (ref 0.0–0.7)
Eosinophils Relative: 1 % (ref 0.0–5.0)
HCT: 41 % (ref 36.0–46.0)
Hemoglobin: 13.4 g/dL (ref 12.0–15.0)
Lymphocytes Relative: 31.9 % (ref 12.0–46.0)
Lymphs Abs: 2.1 10*3/uL (ref 0.7–4.0)
MCHC: 32.7 g/dL (ref 30.0–36.0)
MCV: 84.6 fl (ref 78.0–100.0)
Monocytes Absolute: 0.4 10*3/uL (ref 0.1–1.0)
Monocytes Relative: 6.1 % (ref 3.0–12.0)
Neutro Abs: 4.1 10*3/uL (ref 1.4–7.7)
Neutrophils Relative %: 60.5 % (ref 43.0–77.0)
Platelets: 186 10*3/uL (ref 150.0–400.0)
RBC: 4.85 Mil/uL (ref 3.87–5.11)
RDW: 13.7 % (ref 11.5–15.5)
WBC: 6.7 10*3/uL (ref 4.0–10.5)

## 2019-12-19 LAB — LIPID PANEL
Cholesterol: 146 mg/dL (ref 0–200)
HDL: 55.6 mg/dL (ref >39.00)
LDL Cholesterol: 72 mg/dL (ref 0–99)
NonHDL: 90.16
Total CHOL/HDL Ratio: 3
Triglycerides: 89 mg/dL (ref 0.0–149.0)
VLDL: 17.8 mg/dL (ref 0.0–40.0)

## 2019-12-19 LAB — COMPREHENSIVE METABOLIC PANEL
ALT: 16 U/L (ref 0–35)
AST: 19 U/L (ref 0–37)
Albumin: 4.2 g/dL (ref 3.5–5.2)
Alkaline Phosphatase: 40 U/L (ref 39–117)
BUN: 12 mg/dL (ref 6–23)
CO2: 28 mEq/L (ref 19–32)
Calcium: 9.1 mg/dL (ref 8.4–10.5)
Chloride: 102 mEq/L (ref 96–112)
Creatinine, Ser: 0.84 mg/dL (ref 0.40–1.20)
GFR: 85.9 mL/min (ref >60.00)
Glucose, Bld: 89 mg/dL (ref 70–99)
Potassium: 3.7 mEq/L (ref 3.5–5.1)
Sodium: 137 mEq/L (ref 135–145)
Total Bilirubin: 0.6 mg/dL (ref 0.2–1.2)
Total Protein: 7.2 g/dL (ref 6.0–8.3)

## 2019-12-19 NOTE — Patient Instructions (Addendum)
Sign medical release to get records from [facility]  Go to lab for blood draw  You will be contacted once your form is complete and faxed.  Medical Screening Exam  A medical screening exam helps determine whether or not you need immediate medical treatment. This type of exam may be done in the emergency department, an urgent care setting, or your health care provider's office. During the exam, a health care provider does a short physical exam and asks about your medical history to assess:  Your current symptoms.  Your overall health. Depending on your symptoms, you may need additional tests. What are the possible outcomes of a medical screening exam? Your medical screening exam may determine that:  You do not need emergency treatment at this time.  You need treatment right away.  You need to be transferred to another medical center.  You need to have more tests. A medical specialist may be consulted if necessary. When should I seek medical care? If you have a regular health care provider, make an appointment for a follow-up visit with him or her. If you do not have a regular health care provider, ask about resources in your community. Get help right away if:  Your condition gets worse or you develop new or troubling symptoms before you see your health care provider. If this occurs, go to an emergency department right away. In an emergency:  Call 911 or have someone drive you to the nearest hospital.  Do not drive yourself. Summary  A medical screening exam helps determine whether or not you need immediate medical treatment.  During the exam, a health care provider does a short physical exam and asks about your current symptoms and overall health.  More tests may be ordered during the exam.  You may need to be transferred to another medical center. This information is not intended to replace advice given to you by your health care provider. Make sure you discuss any  questions you have with your health care provider. Document Revised: 05/13/2018 Document Reviewed: 08/17/2017 Elsevier Patient Education  2020 [employer].

## 2019-12-19 NOTE — Progress Notes (Signed)
Subjective:    Patient ID: Kathy [name], female    DOB: 27-Apr-1975, 44 y.o.   MRN: [id number]  Patient presents today for CPE   HPI  Sexual History (orientation,birth control, marital status, STD):married, sexually active, up to date with PAP and mammogram. Completed by GYN  Depression/Suicide: [facility] 12/19/2019 06/08/2018 05/24/2018 05/09/2018 04/25/2018 04/04/2018 03/01/2018  Decreased Interest 0 1 0 1 0 1 1  Down, Depressed, Hopeless 1 2 1 1 1 1 2   PHQ - 2 Score 1 3 1 2 1 2 3   Altered sleeping 0 1 0 0 0 0 0  Tired, decreased energy 1 0 1 0 1 3 2   Change in appetite 0 0 0 0 1 0 3  Feeling bad or failure about yourself  0 0 0 0 0 1 1  Trouble concentrating 0 1 0 0 1 0 0  Moving slowly or fidgety/restless 0 0 0 0 0 0 0  Suicidal thoughts 0 0 0 0 0 0 0  PHQ-9 Score 2 5 2 2 4 6 9   Difficult doing work/chores Not difficult at all - - - - - Not difficult at all  Some recent data might be hidden   Vision:up to date  Dental:up to date  Immunizations: (TDAP, Hep C screen, Pneumovax, Influenza, zoster)  Health Maintenance  Topic Date Due  .  Hepatitis C: One time screening is recommended by Center for Disease Control  (CDC) for  adults born from [birth year] through 1963.   Never done  . Tetanus Vaccine  10/05/2020  . Pap Smear  12/15/2020  . Flu Shot  Completed  . COVID-19 Vaccine  Completed  . HIV Screening  Completed   Diet:regular.  Weight:  Wt Readings from Last 3 Encounters:  12/19/19 220 lb (99.8 kg)  07/10/19 228 lb (103.4 kg)  05/29/19 240 lb (108.9 kg)   Fall Risk: Fall Risk  12/19/2019 11/26/2016  Falls in the past year? 0 No  Number falls in past yr: 0 -  Injury with Fall? 0 -   Medications and allergies reviewed with patient and updated if appropriate.  Patient Active Problem List   Diagnosis Date Noted  . Lateral epicondylitis of right elbow 05/29/2019  . Hair loss 01/16/2019  . Morbid obesity (HCC) 05/30/2018  . Primary osteoarthritis of  both hands 05/26/2018  . 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 Hypermobility arthralgia 05/26/2018  . Vitamin D deficiency 03/24/2018  . Family history of systemic lupus erythematosus 03/24/2018  . HSV (herpes simplex virus) infection 03/24/2018  . History of anxiety 03/24/2018  . History of hyperlipidemia 03/24/2018  . Abnormal smell 02/11/2017  . Chest pain 02/11/2017  . Biological false positive RPR test 11/26/2016    Current Outpatient Medications on File Prior to Visit  Medication Sig Dispense Refill  . acetaminophen (TYLENOL) 650 MG CR tablet Take 650 mg by mouth every 8 (eight) hours as needed for pain.    Marland Kitchen. ALPRAZolam (XANAX) 0.5 MG tablet Take 0.5 mg by mouth 2 (two) times daily as needed for anxiety.    . Aspirin-Acetaminophen-Caffeine (EXCEDRIN PO) Take by mouth.    Marland Kitchen. ibuprofen (ADVIL) 200 MG tablet Take 200 mg by mouth every 6 (six) hours as needed.    . valACYclovir (VALTREX) 500 MG tablet Take 500 mg by mouth daily as needed.     . Vitamin D, Ergocalciferol, (DRISDOL) 1.25 MG (50000 UT) CAPS capsule Take 1 capsule (50,000 Units total) by mouth every 7 (  seven) days. 4 capsule 0   No current facility-administered medications on file prior to visit.    Past Medical History:  Diagnosis Date  . Anxiety   . Arthritis   . Back pain   . Chest pain   . Dizziness   . Fatigue   . Generalized headaches   . Hand tingling   . Hip pain   . HLD (hyperlipidemia)   . HSV (herpes simplex virus) infection   . Knee pain   . Multiple food allergies    Avocado, Banana, [name], [name], sometimes apples:  Nausea, vomitting itching in throat  . Palpitations   . Postpartum care following vaginal delivery 10/05/2010    Past Surgical History:  Procedure Laterality Date  . NO PAST SURGERIES      Social History   Socioeconomic History  . Marital status: Single    Spouse name: Not on file  . Number of children: Not on file  . Years of education: Not on file  . Highest  education level: Not on file  Occupational History  . Occupation: Water quality scientist: [employer]  Tobacco Use  . Smoking status: Former Smoker    Packs/day: 1.00    Years: 10.00    Pack years: 10.00    Types: Cigarettes    Quit date: 2007    Years since quitting: 14.8  . Smokeless tobacco: Never Used  Vaping Use  . Vaping Use: Never used  Substance and Sexual Activity  . Alcohol use: Yes    Comment: rare on ocassion  . Drug use: No  . Sexual activity: Yes    Partners: Male  Other Topics Concern  . Not on file  Social History Narrative  . Not on file   Social Determinants of Health   Financial Resource Strain:   . Difficulty of Paying Living Expenses: Not on file  Food Insecurity:   . Worried About Programme researcher, broadcasting/film/video in the Last Year: Not on file  . Ran Out of Food in the Last Year: Not on file  Transportation Needs:   . Lack of Transportation (Medical): Not on file  . Lack of Transportation (Non-Medical): Not on file  Physical Activity:   . Days of Exercise per Week: Not on file  . Minutes of Exercise per Session: Not on file  Stress:   . Feeling of Stress : Not on file  Social Connections:   . Frequency of Communication with Friends and Family: Not on file  . Frequency of Social Gatherings with Friends and Family: Not on file  . Attends Religious Services: Not on file  . Active Member of Clubs or Organizations: Not on file  . Attends Banker Meetings: Not on file  . Marital Status: Not on file    Family History  Problem Relation Age of Onset  . Lymphoma Mother   . Breast cancer Maternal Grandmother   . Diabetes Maternal Grandmother   . Diabetes Maternal Grandfather   . Cancer Maternal Grandfather        Unsure if Prostate or colon  . Diabetes Paternal Grandmother   . Diabetes Paternal Grandfather   . Obesity Father        Review of Systems  Constitutional: Negative for fever, malaise/fatigue and weight loss.  HENT: Negative  for congestion and sore throat.   Eyes:       Negative for visual changes  Respiratory: Negative for cough and shortness of breath.   Cardiovascular:  Negative for chest pain, palpitations and leg swelling.  Gastrointestinal: Negative for blood in stool, constipation, diarrhea and heartburn.  Genitourinary: Negative for dysuria, frequency and urgency.  Musculoskeletal: Negative for falls, joint pain and myalgias.  Skin: Negative for rash.  Neurological: Negative for dizziness, sensory change and headaches.  Endo/Heme/Allergies: Does not bruise/bleed easily.  Psychiatric/Behavioral: Negative for depression, substance abuse and suicidal ideas. The patient is not nervous/anxious.     Objective:   Vitals:   12/19/19 0938  BP: 112/60  Pulse: 68  Temp: (!) 96.5 F (35.8 C)  SpO2: 97%   Body mass index is 36.33 kg/m.  Physical Examination:  Physical Exam Vitals and nursing note reviewed.  Constitutional:      General: She is not in acute distress.    Appearance: She is well-developed.  HENT:     Right Ear: Tympanic membrane, ear canal and external ear normal.     Left Ear: Tympanic membrane, ear canal and external ear normal.  Eyes:     Extraocular Movements: Extraocular movements intact.     Conjunctiva/sclera: Conjunctivae normal.  Cardiovascular:     Rate and Rhythm: Normal rate and regular rhythm.     Pulses: Normal pulses.     Heart sounds: Normal heart sounds.  Pulmonary:     Effort: Pulmonary effort is normal. No respiratory distress.     Breath sounds: Normal breath sounds.  Chest:     Chest wall: No tenderness.  Abdominal:     General: Bowel sounds are normal.     Palpations: Abdomen is soft.  Genitourinary:    Comments: Deferred breast and pelvic exam to GYN Musculoskeletal:        General: Normal range of motion.     Cervical back: Normal range of motion and neck supple.  Lymphadenopathy:     Cervical: No cervical adenopathy.  Skin:    General: Skin is  warm and dry.  Neurological:     Mental Status: She is alert and oriented to person, place, and time.     Deep Tendon Reflexes: Reflexes are normal and symmetric.  Psychiatric:        Mood and Affect: Mood normal.        Behavior: Behavior normal.        Thought Content: Thought content normal.    ASSESSMENT and PLAN: This visit occurred during the SARS-CoV-2 public health emergency.  Safety protocols were in place, including screening questions prior to the visit, additional usage of staff PPE, and extensive cleaning of exam room while observing appropriate contact time as indicated for disinfecting solutions.   Kathy [name] was seen today for establish care.  Diagnoses and all orders for this visit:  Preventative health care -     CBC with Differential/Platelet -     Comprehensive metabolic panel -     Lipid panel  Influenza vaccine needed -     Flu Vaccine QUAD 6+ mos PF IM (Fluarix Quad PF)  Encounter for lipid screening for cardiovascular disease -     Lipid panel      Problem List Items Addressed This Visit    None    Visit Diagnoses    Preventative health care    -  Primary   Relevant Orders   CBC with Differential/Platelet   Comprehensive metabolic panel   Lipid panel   Influenza vaccine needed       Relevant Orders   Flu Vaccine QUAD 6+ mos PF IM (Fluarix Quad PF) (Completed)  Encounter for lipid screening for cardiovascular disease       Relevant Orders   Lipid panel      Follow up: Return in about 1 year (around 12/18/2020) for CPE (fasting).  Alysia Penna, NP

## 2019-12-20 ENCOUNTER — Encounter: Payer: Self-pay | Admitting: Nurse Practitioner

## 2019-12-22 ENCOUNTER — Encounter: Payer: Self-pay | Admitting: Nurse Practitioner

## 2019-12-22 LAB — VITAMIN D 1,25 DIHYDROXY
Vitamin D 1, 25 (OH)2 Total: 41 pg/mL (ref 18–72)
Vitamin D2 1, 25 (OH)2: <8 pg/mL
Vitamin D3 1, 25 (OH)2: 41 pg/mL

## 2020-01-01 ENCOUNTER — Other Ambulatory Visit: Payer: Self-pay

## 2020-01-01 DIAGNOSIS — R8290 Unspecified abnormal findings in urine: Secondary | ICD-10-CM

## 2020-01-01 NOTE — Progress Notes (Signed)
uri

## 2020-01-01 NOTE — Addendum Note (Signed)
Addended by: Michaela Corner on: 01/01/2020 03:27 PM   Modules accepted: Orders

## 2020-01-02 ENCOUNTER — Other Ambulatory Visit (INDEPENDENT_AMBULATORY_CARE_PROVIDER_SITE_OTHER): Payer: BC Managed Care – PPO

## 2020-01-02 DIAGNOSIS — E6609 Other obesity due to excess calories: Secondary | ICD-10-CM

## 2020-01-02 DIAGNOSIS — Z6836 Body mass index (BMI) 36.0-36.9, adult: Secondary | ICD-10-CM | POA: Diagnosis not present

## 2020-01-02 DIAGNOSIS — R8290 Unspecified abnormal findings in urine: Secondary | ICD-10-CM | POA: Diagnosis not present

## 2020-01-02 DIAGNOSIS — N3000 Acute cystitis without hematuria: Secondary | ICD-10-CM

## 2020-01-02 LAB — HEMOGLOBIN A1C: Hgb A1c MFr Bld: 5.7 % (ref 4.6–6.5)

## 2020-01-03 ENCOUNTER — Encounter: Payer: Self-pay | Admitting: Nurse Practitioner

## 2020-01-03 MED ORDER — NITROFURANTOIN MONOHYD MACRO 100 MG PO CAPS: 100.0000 mg | ORAL_CAPSULE | Freq: Two times a day (BID) | ORAL | 0 refills | Status: AC

## 2020-01-03 NOTE — Addendum Note (Signed)
Addended by: Alysia Penna L on: 01/03/2020 02:30 PM   Modules accepted: Orders

## 2020-01-05 ENCOUNTER — Encounter: Payer: Self-pay | Admitting: Nurse Practitioner

## 2020-01-05 DIAGNOSIS — N3000 Acute cystitis without hematuria: Secondary | ICD-10-CM

## 2020-01-05 LAB — URINALYSIS W MICROSCOPIC + REFLEX CULTURE
Bilirubin Urine: NEGATIVE
Glucose, UA: NEGATIVE
Hyaline Cast: NONE SEEN /LPF
Ketones, ur: NEGATIVE
Nitrites, Initial: POSITIVE — AB
Protein, ur: NEGATIVE
RBC / HPF: NONE SEEN /HPF (ref 0–2)
Specific Gravity, Urine: 1.012 (ref 1.001–1.03)
pH: 5.5 (ref 5.0–8.0)

## 2020-01-05 LAB — URINE CULTURE
MICRO NUMBER:: 11261652
SPECIMEN QUALITY:: ADEQUATE

## 2020-01-05 LAB — CULTURE INDICATED

## 2020-01-05 LAB — INSULIN, RANDOM: Insulin: 8.8 u[IU]/mL

## 2020-02-10 ENCOUNTER — Other Ambulatory Visit: Payer: BC Managed Care – PPO

## 2020-02-10 ENCOUNTER — Other Ambulatory Visit: Payer: Self-pay

## 2020-02-10 DIAGNOSIS — Z20822 Contact with and (suspected) exposure to covid-19: Secondary | ICD-10-CM

## 2020-02-11 DIAGNOSIS — Z20822 Contact with and (suspected) exposure to covid-19: Secondary | ICD-10-CM | POA: Diagnosis not present

## 2020-02-13 LAB — SARS-COV-2, NAA 2 DAY TAT

## 2020-02-13 LAB — NOVEL CORONAVIRUS, NAA: SARS-CoV-2, NAA: NOT DETECTED

## 2020-02-14 ENCOUNTER — Other Ambulatory Visit: Payer: BC Managed Care – PPO

## 2020-04-29 DIAGNOSIS — Z131 Encounter for screening for diabetes mellitus: Secondary | ICD-10-CM | POA: Diagnosis not present

## 2020-04-29 DIAGNOSIS — Z0000 Encounter for general adult medical examination without abnormal findings: Secondary | ICD-10-CM | POA: Diagnosis not present

## 2020-04-29 DIAGNOSIS — Z6837 Body mass index (BMI) 37.0-37.9, adult: Secondary | ICD-10-CM | POA: Diagnosis not present

## 2020-04-29 DIAGNOSIS — Z01419 Encounter for gynecological examination (general) (routine) without abnormal findings: Secondary | ICD-10-CM | POA: Diagnosis not present

## 2020-04-29 DIAGNOSIS — Z1329 Encounter for screening for other suspected endocrine disorder: Secondary | ICD-10-CM | POA: Diagnosis not present

## 2020-04-29 DIAGNOSIS — Z1231 Encounter for screening mammogram for malignant neoplasm of breast: Secondary | ICD-10-CM | POA: Diagnosis not present

## 2020-04-29 DIAGNOSIS — Z118 Encounter for screening for other infectious and parasitic diseases: Secondary | ICD-10-CM | POA: Diagnosis not present

## 2020-04-29 DIAGNOSIS — Z1159 Encounter for screening for other viral diseases: Secondary | ICD-10-CM | POA: Diagnosis not present

## 2020-04-29 DIAGNOSIS — Z113 Encounter for screening for infections with a predominantly sexual mode of transmission: Secondary | ICD-10-CM | POA: Diagnosis not present

## 2020-04-29 DIAGNOSIS — Z124 Encounter for screening for malignant neoplasm of cervix: Secondary | ICD-10-CM | POA: Diagnosis not present

## 2020-04-29 DIAGNOSIS — Z114 Encounter for screening for human immunodeficiency virus [HIV]: Secondary | ICD-10-CM | POA: Diagnosis not present

## 2020-04-29 DIAGNOSIS — Z01411 Encounter for gynecological examination (general) (routine) with abnormal findings: Secondary | ICD-10-CM | POA: Diagnosis not present

## 2020-07-02 DIAGNOSIS — Z30432 Encounter for removal of intrauterine contraceptive device: Secondary | ICD-10-CM | POA: Diagnosis not present

## 2020-10-16 DIAGNOSIS — J310 Chronic rhinitis: Secondary | ICD-10-CM | POA: Diagnosis not present

## 2020-10-16 DIAGNOSIS — J343 Hypertrophy of nasal turbinates: Secondary | ICD-10-CM | POA: Diagnosis not present

## 2020-10-16 DIAGNOSIS — K219 Gastro-esophageal reflux disease without esophagitis: Secondary | ICD-10-CM | POA: Diagnosis not present

## 2020-10-16 DIAGNOSIS — J342 Deviated nasal septum: Secondary | ICD-10-CM | POA: Diagnosis not present

## 2020-10-28 ENCOUNTER — Encounter: Payer: Self-pay | Admitting: Family Medicine

## 2020-10-28 DIAGNOSIS — M546 Pain in thoracic spine: Secondary | ICD-10-CM | POA: Diagnosis not present

## 2020-10-28 DIAGNOSIS — M25512 Pain in left shoulder: Secondary | ICD-10-CM | POA: Diagnosis not present

## 2020-10-28 DIAGNOSIS — M545 Low back pain, unspecified: Secondary | ICD-10-CM | POA: Diagnosis not present

## 2020-10-29 ENCOUNTER — Ambulatory Visit: Payer: BC Managed Care – PPO | Admitting: Family Medicine

## 2020-11-11 DIAGNOSIS — Z1159 Encounter for screening for other viral diseases: Secondary | ICD-10-CM | POA: Diagnosis not present

## 2020-11-11 DIAGNOSIS — Z113 Encounter for screening for infections with a predominantly sexual mode of transmission: Secondary | ICD-10-CM | POA: Diagnosis not present

## 2020-11-11 DIAGNOSIS — R87610 Atypical squamous cells of undetermined significance on cytologic smear of cervix (ASC-US): Secondary | ICD-10-CM | POA: Diagnosis not present

## 2020-11-11 DIAGNOSIS — Z118 Encounter for screening for other infectious and parasitic diseases: Secondary | ICD-10-CM | POA: Diagnosis not present

## 2020-11-11 DIAGNOSIS — Z114 Encounter for screening for human immunodeficiency virus [HIV]: Secondary | ICD-10-CM | POA: Diagnosis not present

## 2020-11-11 DIAGNOSIS — N760 Acute vaginitis: Secondary | ICD-10-CM | POA: Diagnosis not present

## 2020-11-19 DIAGNOSIS — M5451 Vertebrogenic low back pain: Secondary | ICD-10-CM | POA: Diagnosis not present

## 2020-11-19 DIAGNOSIS — M546 Pain in thoracic spine: Secondary | ICD-10-CM | POA: Diagnosis not present

## 2020-11-25 DIAGNOSIS — M25512 Pain in left shoulder: Secondary | ICD-10-CM | POA: Diagnosis not present

## 2020-11-27 ENCOUNTER — Other Ambulatory Visit: Payer: Self-pay

## 2020-11-27 ENCOUNTER — Encounter: Payer: Self-pay | Admitting: Nurse Practitioner

## 2020-11-27 ENCOUNTER — Ambulatory Visit (INDEPENDENT_AMBULATORY_CARE_PROVIDER_SITE_OTHER): Payer: BC Managed Care – PPO

## 2020-11-27 DIAGNOSIS — Z23 Encounter for immunization: Secondary | ICD-10-CM | POA: Diagnosis not present

## 2020-11-27 NOTE — Progress Notes (Signed)
After obtaining informed consent, the flu immunization was given IM in the right deltoid by Dominic Pea, CMA. Pt tolerated injection well. Pt instructed to report any adverse reactions to me immediately.

## 2020-12-19 ENCOUNTER — Other Ambulatory Visit: Payer: Self-pay

## 2020-12-19 ENCOUNTER — Ambulatory Visit (INDEPENDENT_AMBULATORY_CARE_PROVIDER_SITE_OTHER): Payer: BC Managed Care – PPO | Admitting: Nurse Practitioner

## 2020-12-19 ENCOUNTER — Encounter: Payer: Self-pay | Admitting: Nurse Practitioner

## 2020-12-19 VITALS — BP 124/76 | HR 71 | Temp 97.1°F | Ht 65.0 in | Wt 237.0 lb

## 2020-12-19 DIAGNOSIS — Z13220 Encounter for screening for lipoid disorders: Secondary | ICD-10-CM

## 2020-12-19 DIAGNOSIS — H608X3 Other otitis externa, bilateral: Secondary | ICD-10-CM | POA: Diagnosis not present

## 2020-12-19 DIAGNOSIS — Z0001 Encounter for general adult medical examination with abnormal findings: Secondary | ICD-10-CM | POA: Diagnosis not present

## 2020-12-19 DIAGNOSIS — Z136 Encounter for screening for cardiovascular disorders: Secondary | ICD-10-CM

## 2020-12-19 DIAGNOSIS — R739 Hyperglycemia, unspecified: Secondary | ICD-10-CM

## 2020-12-19 LAB — TSH: TSH: 2.58 u[IU]/mL (ref 0.35–5.50)

## 2020-12-19 LAB — COMPREHENSIVE METABOLIC PANEL
ALT: 16 U/L (ref 0–35)
AST: 17 U/L (ref 0–37)
Albumin: 4.3 g/dL (ref 3.5–5.2)
Alkaline Phosphatase: 42 U/L (ref 39–117)
BUN: 13 mg/dL (ref 6–23)
CO2: 28 mEq/L (ref 19–32)
Calcium: 9.5 mg/dL (ref 8.4–10.5)
Chloride: 103 mEq/L (ref 96–112)
Creatinine, Ser: 0.95 mg/dL (ref 0.40–1.20)
GFR: 73.59 mL/min (ref >60.00)
Glucose, Bld: 99 mg/dL (ref 70–99)
Potassium: 4.7 mEq/L (ref 3.5–5.1)
Sodium: 137 mEq/L (ref 135–145)
Total Bilirubin: 0.6 mg/dL (ref 0.2–1.2)
Total Protein: 7.3 g/dL (ref 6.0–8.3)

## 2020-12-19 LAB — LIPID PANEL
Cholesterol: 178 mg/dL (ref 0–200)
HDL: 51.2 mg/dL (ref >39.00)
LDL Cholesterol: 109 mg/dL — ABNORMAL HIGH (ref 0–99)
NonHDL: 126.52
Total CHOL/HDL Ratio: 3
Triglycerides: 87 mg/dL (ref 0.0–149.0)
VLDL: 17.4 mg/dL (ref 0.0–40.0)

## 2020-12-19 LAB — HEMOGLOBIN A1C: Hgb A1c MFr Bld: 5.9 % (ref 4.6–6.5)

## 2020-12-19 MED ORDER — HYDROCORTISONE-ACETIC ACID 1-2 % OT SOLN: 3.0000 [drp] | Freq: Three times a day (TID) | OTIC | 0 refills | Status: DC

## 2020-12-19 NOTE — Patient Instructions (Signed)
Go to lab for blood draw.  Start heart healthy diet and exercise (3-4x/week, 30-66mns each, combination of cardio and weight training)  Preventive Care [age]Years Old, Female Preventive care refers to lifestyle choices and visits with your health care provider that can promote health and wellness. Preventive care visits are also called wellness exams. What can I expect for my preventive care visit? Counseling Your health care provider may ask you questions about your: Medical history, including: Past medical problems. Family medical history. Pregnancy history. Current health, including: Menstrual cycle. Method of birth control. Emotional well-being. Home life and relationship well-being. Sexual activity and sexual health. Lifestyle, including: Alcohol, nicotine or tobacco, and drug use. Access to firearms. Diet, exercise, and sleep habits. Work and work eStatistician Sunscreen use. Safety issues such as seatbelt and bike helmet use. Physical exam Your health care provider will check your: Height and weight. These may be used to calculate your BMI (body mass index). BMI is a measurement that tells if you are at a healthy weight. Waist circumference. This measures the distance around your waistline. This measurement also tells if you are at a healthy weight and may help predict your risk of certain diseases, such as type 2 diabetes and high blood pressure. Heart rate and blood pressure. Body temperature. Skin for abnormal spots. What immunizations do I need? Vaccines are usually given at various ages, according to a schedule. Your health care provider will recommend vaccines for you based on your age, medical history, and lifestyle or other factors, such as travel or where you work. What tests do I need? Screening Your health care provider may recommend screening tests for certain conditions. This may include: Lipid and cholesterol levels. Diabetes screening. This is done by  checking your blood sugar (glucose) after you have not eaten for a while (fasting). Pelvic exam and Pap test. Hepatitis B test. Hepatitis C test. HIV (human immunodeficiency virus) test. STI (sexually transmitted infection) testing, if you are at risk. Lung cancer screening. Colorectal cancer screening. Mammogram. Talk with your health care provider about when you should start having regular mammograms. This may depend on whether you have a family history of breast cancer. BRCA-related cancer screening. This may be done if you have a family history of breast, ovarian, tubal, or peritoneal cancers. Bone density scan. This is done to screen for osteoporosis. Talk with your health care provider about your test results, treatment options, and if necessary, the need for more tests. Follow these instructions at home: Eating and drinking  Eat a diet that includes fresh fruits and vegetables, whole grains, lean protein, and low-fat dairy products. Take vitamin and mineral supplements as recommended by your health care provider. Do not drink alcohol if: Your health care provider tells you not to drink. You are pregnant, may be pregnant, or are planning to become pregnant. If you drink alcohol: Limit how much you have to 0-1 drink a day. Know how much alcohol is in your drink. In the [place] one drink equals one 12 oz bottle of beer (355 mL), one 5 oz glass of wine (148 mL), or one 1 oz glass of hard liquor (44 mL). Lifestyle Brush your teeth every morning and night with fluoride toothpaste. Floss one time each day. Exercise for at least 30 minutes 5 or more days each week. Do not use any products that contain nicotine or tobacco. These products include cigarettes, chewing tobacco, and vaping devices, such as e-cigarettes. If you need help quitting, ask your health care  provider. Do not use drugs. If you are sexually active, practice safe sex. Use a condom or other form of protection to prevent  STIs. If you do not wish to become pregnant, use a form of birth control. If you plan to become pregnant, see your health care provider for a prepregnancy visit. Take aspirin only as told by your health care provider. Make sure that you understand how much to take and what form to take. Work with your health care provider to find out whether it is safe and beneficial for you to take aspirin daily. Find healthy ways to manage stress, such as: Meditation, yoga, or listening to music. Journaling. Talking to a trusted person. Spending time with friends and family. Minimize exposure to UV radiation to reduce your risk of skin cancer. Safety Always wear your seat belt while driving or riding in a vehicle. Do not drive: If you have been drinking alcohol. Do not ride with someone who has been drinking. When you are tired or distracted. While texting. If you have been using any mind-altering substances or drugs. Wear a helmet and other protective equipment during sports activities. If you have firearms in your house, make sure you follow all gun safety procedures. Seek help if you have been physically or sexually abused. What's next? Visit your health care provider once a year for an annual wellness visit. Ask your health care provider how often you should have your eyes and teeth checked. Stay up to date on all vaccines. This information is not intended to replace advice given to you by your health care provider. Make sure you discuss any questions you have with your health care provider. Document Revised: 07/17/2020 Document Reviewed: 07/17/2020 Elsevier Patient Education  [employer].

## 2020-12-19 NOTE — Progress Notes (Signed)
Subjective:    Patient ID: Kathy [name], female    DOB: 22-May-1975, 45 y.o.   MRN: [id number]  Patient presents today for CPE and eval of chronic conditions  Ear Drainage  There is pain in both ears. This is a recurrent problem. The current episode started 1 to 4 weeks ago. The problem occurs constantly. The problem has been waxing and waning. There has been no fever. The patient is experiencing no pain. Associated symptoms include ear discharge. Pertinent negatives include no abdominal pain, coughing, diarrhea, headaches, hearing loss, neck pain, rash, rhinorrhea, sore throat or vomiting. She has tried nothing for the symptoms. There is no history of a chronic ear infection, hearing loss or a tympanostomy tube.   Vision:up to date Dental:up to date Diet:regular Exercise: none Weight:  Wt Readings from Last 3 Encounters:  12/19/20 237 lb (107.5 kg)  12/19/19 220 lb (99.8 kg)  07/10/19 228 lb (103.4 kg)    Sexual History (orientation,birth control, marital status, STD):will have mammogram and pap smear completed byGYN  Depression/Suicide: Depression screen [facility] 2/9 12/19/2020 12/19/2019 06/08/2018 05/24/2018 05/09/2018 04/25/2018 04/04/2018  Decreased Interest 0 0 1 0 1 0 1  Down, Depressed, Hopeless 1 1 2 1 1 1 1   PHQ - 2 Score 1 1 3 1 2 1 2   Altered sleeping 0 0 1 0 0 0 0  Tired, decreased energy 0 1 0 1 0 1 3  Change in appetite 0 0 0 0 0 1 0  Feeling bad or failure about yourself  0 0 0 0 0 0 1  Trouble concentrating 0 0 1 0 0 1 0  Moving slowly or fidgety/restless 0 0 0 0 0 0 0  Suicidal thoughts 0 0 0 0 0 0 0  PHQ-9 Score 1 2 5 2 2 4 6   Difficult doing work/chores Not difficult at all Not difficult at all - - - - -  Some recent data might be hidden   Immunizations: (TDAP, Hep C screen, Pneumovax, Influenza, zoster)  Health Maintenance  Topic Date Due   Hepatitis C Screening: USPSTF Recommendation to screen - Ages 4-79 yo.  Never done   COVID-19 Vaccine (3 - Booster for  Pfizer series) 07/16/2019   Tetanus Vaccine  10/05/2020   Pap Smear  12/15/2020   Flu Shot  Completed   HIV Screening  Completed   Pneumococcal Vaccination  Aged Out   HPV Vaccine  Aged Out   Fall Risk: Fall Risk  12/19/2020 12/19/2019 11/26/2016  Falls in the past year? 0 0 No  Number falls in past yr: 0 0 -  Injury with Fall? 0 0 -  Risk for fall due to : No Fall Risks - -  Follow up Falls evaluation completed - -   Medications and allergies reviewed with patient and updated if appropriate.  Patient Active Problem List   Diagnosis Date Noted   Lateral epicondylitis of right elbow 05/29/2019   Hair loss 01/16/2019   Morbid obesity (HCC) 05/30/2018   Primary osteoarthritis of both hands 05/26/2018   Chondromalacia of patella 05/26/2018   Hypermobility arthralgia 05/26/2018   Vitamin D deficiency 03/24/2018   Family history of systemic lupus erythematosus 03/24/2018   HSV (herpes simplex virus) infection 03/24/2018   History of anxiety 03/24/2018   History of hyperlipidemia 03/24/2018   Abnormal smell 02/11/2017   Chest pain 02/11/2017   Biological false positive RPR test 11/26/2016    Current Outpatient Medications on File Prior to Visit  Medication Sig Dispense Refill   acetaminophen (TYLENOL) 650 MG CR tablet Take 650 mg by mouth every 8 (eight) hours as needed for pain.     ALPRAZolam (XANAX) 0.5 MG tablet Take 0.5 mg by mouth 2 (two) times daily as needed for anxiety.     Aspirin-Acetaminophen-Caffeine (EXCEDRIN PO) Take by mouth.     ibuprofen (ADVIL) 200 MG tablet Take 200 mg by mouth every 6 (six) hours as needed.     TURMERIC PO turmeric     valACYclovir (VALTREX) 500 MG tablet Take 500 mg by mouth daily as needed.      No current facility-administered medications on file prior to visit.    Past Medical History:  Diagnosis Date   Anxiety    Arthritis    Back pain    Chest pain    Dizziness    Fatigue    Generalized headaches    Hand tingling    Hip  pain    HLD (hyperlipidemia)    HSV (herpes simplex virus) infection    Knee pain    Multiple food allergies    Avocado, Banana, [name], [name], sometimes apples:  Nausea, vomitting itching in throat   Palpitations    Postpartum care following vaginal delivery 10/05/2010    Past Surgical History:  Procedure Laterality Date   NO PAST SURGERIES      Social History   Socioeconomic History   Marital status: Single    Spouse name: Not on file   Number of children: Not on file   Years of education: Not on file   Highest education level: Not on file  Occupational History   Occupation: Client Associate    Employer: [employer]  Tobacco Use   Smoking status: Former    Packs/day: 1.00    Years: 10.00    Pack years: 10.00    Types: Cigarettes    Quit date: 2007    Years since quitting: 15.8   Smokeless tobacco: Never  Vaping Use   Vaping Use: Never used  Substance and Sexual Activity   Alcohol use: Yes    Comment: rare on ocassion   Drug use: No   Sexual activity: Yes    Partners: Male  Other Topics Concern   Not on file  Social History Narrative   Not on file   Social Determinants of Health   Financial Resource Strain: Not on file  Food Insecurity: Not on file  Transportation Needs: Not on file  Physical Activity: Not on file  Stress: Not on file  Social Connections: Not on file    Family History  Problem Relation Age of Onset   Lymphoma Mother    Breast cancer Maternal Grandmother    Diabetes Maternal Grandmother    Diabetes Maternal Grandfather    Cancer Maternal Grandfather        Unsure if Prostate or colon   Diabetes Paternal Grandmother    Diabetes Paternal Grandfather    Obesity Father         Review of Systems  Constitutional:  Negative for fever, malaise/fatigue and weight loss.  HENT:  Positive for ear discharge. Negative for congestion, hearing loss, rhinorrhea and sore throat.   Eyes:        Negative for visual changes   Respiratory:  Negative for cough and shortness of breath.   Cardiovascular:  Negative for chest pain, palpitations and leg swelling.  Gastrointestinal:  Negative for abdominal pain, blood in stool, constipation, diarrhea, heartburn and vomiting.  Genitourinary:  Negative for dysuria, frequency and urgency.  Musculoskeletal:  Negative for falls, joint pain, myalgias and neck pain.  Skin:  Negative for rash.  Neurological:  Negative for dizziness, sensory change and headaches.  Endo/Heme/Allergies:  Does not bruise/bleed easily.  Psychiatric/Behavioral:  Negative for depression, substance abuse and suicidal ideas. The patient is not nervous/anxious.    Objective:   Vitals:   12/19/20 0936  BP: 124/76  Pulse: 71  Temp: (!) 97.1 F (36.2 C)  SpO2: 97%    Body mass index is 39.44 kg/m.   Physical Examination:  Physical Exam Vitals reviewed.  Constitutional:      General: She is not in acute distress.    Appearance: She is well-developed.  HENT:     Right Ear: Hearing, tympanic membrane and external ear normal. Drainage and swelling present. No tenderness. No middle ear effusion. There is no impacted cerumen. No mastoid tenderness.     Left Ear: Hearing, tympanic membrane and external ear normal. Drainage and swelling present. No tenderness.  No middle ear effusion. There is no impacted cerumen. No mastoid tenderness.     Nose: Nose normal.     Mouth/Throat:     Pharynx: No oropharyngeal exudate.  Eyes:     Conjunctiva/sclera: Conjunctivae normal.     Pupils: Pupils are equal, round, and reactive to light.  Cardiovascular:     Rate and Rhythm: Normal rate and regular rhythm.     Pulses: Normal pulses.     Heart sounds: Normal heart sounds.  Pulmonary:     Effort: Pulmonary effort is normal. No respiratory distress.     Breath sounds: Normal breath sounds.  Chest:     Chest wall: No tenderness.  Genitourinary:    Comments: Pelvic and breast exam deferred to  GYN Musculoskeletal:        General: Normal range of motion.     Right lower leg: No edema.     Left lower leg: No edema.  Skin:    General: Skin is warm and dry.  Neurological:     Mental Status: She is alert and oriented to person, place, and time.     Deep Tendon Reflexes: Reflexes are normal and symmetric.    ASSESSMENT and PLAN: This visit occurred during the SARS-CoV-2 public health emergency.  Safety protocols were in place, including screening questions prior to the visit, additional usage of staff PPE, and extensive cleaning of exam room while observing appropriate contact time as indicated for disinfecting solutions.   Kathy [name] was seen today for annual exam.  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    Start heart healthy diet and exercise (3-4x/week, 30-20mins each, combination of cardio and weight training) Maintain upcoming appt with GYN for PAP and breast exam. Have report faxed to me.  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  Alysia Penna, NP

## 2020-12-19 NOTE — Progress Notes (Signed)
Stable lab results with hgbA1c at 5.9% Make lifestyle modifications as discussed

## 2020-12-25 DIAGNOSIS — M7542 Impingement syndrome of left shoulder: Secondary | ICD-10-CM | POA: Insufficient documentation

## 2020-12-31 ENCOUNTER — Telehealth: Payer: Self-pay | Admitting: Nurse Practitioner

## 2020-12-31 NOTE — Telephone Encounter (Signed)
Pt called and just wanted to let us know that she tested positive for covid over the weekend, she didn't want to schedule an appt so I just let her know to call us if needed

## 2021-02-20 NOTE — Progress Notes (Signed)
Acute Office Visit  Subjective:    Patient ID: COURTLAND COPPA, female    DOB: 1975/02/05, 46 y.o.   MRN: [id number]  Chief Complaint  Patient presents with   Breast Pain    Pt c/o of pain under left breast when coughing or sneezing, x1 week.     HPI Patient is in today for pain under left breast for 1 week. The pain is aggravated by coughing, sneezing, and twisting. When she is breathing normal it doesn't hurt. Deep breaths don't worsen the pain either. She states it feel like a bruise and sometimes it is sore when touching. She does not remember any injuries and was not recently sick. The pain is about a 7-8 when sneezing or coughing. The pain does not radiate. She has not tried anything at home because it doesn't hurt all the time. She denies shortness of breath and chest pain.   Past Medical History:  Diagnosis Date   Anxiety    Arthritis    Back pain    Chest pain    Dizziness    Fatigue    Generalized headaches    Hand tingling    Hip pain    HLD (hyperlipidemia)    HSV (herpes simplex virus) infection    Knee pain    Multiple food allergies    Avocado, Banana, [name], [name], sometimes apples:  Nausea, vomitting itching in throat   Palpitations    Postpartum care following vaginal delivery 10/05/2010    Past Surgical History:  Procedure Laterality Date   NO PAST SURGERIES      Family History  Problem Relation Age of Onset   Lymphoma Mother    Breast cancer Maternal Grandmother    Diabetes Maternal Grandmother    Diabetes Maternal Grandfather    Cancer Maternal Grandfather        Unsure if Prostate or colon   Diabetes Paternal Grandmother    Diabetes Paternal Grandfather    Obesity Father     Social History   Socioeconomic History   Marital status: Single    Spouse name: Not on file   Number of children: Not on file   Years of education: Not on file   Highest education level: Not on file  Occupational History   Occupation: Client Associate     Employer: [employer]  Tobacco Use   Smoking status: Former    Packs/day: 1.00    Years: 10.00    Pack years: 10.00    Types: Cigarettes    Quit date: 2007    Years since quitting: 16.0   Smokeless tobacco: Never  Vaping Use   Vaping Use: Never used  Substance and Sexual Activity   Alcohol use: Yes    Comment: rare on ocassion   Drug use: No   Sexual activity: Yes    Partners: Male  Other Topics Concern   Not on file  Social History Narrative   Not on file   Social Determinants of Health   Financial Resource Strain: Not on file  Food Insecurity: Not on file  Transportation Needs: Not on file  Physical Activity: Not on file  Stress: Not on file  Social Connections: Not on file  Intimate Partner Violence: Not on file    Outpatient Medications Prior to Visit  Medication Sig Dispense Refill   acetaminophen (TYLENOL) 650 MG CR tablet Take 650 mg by mouth every 8 (eight) hours as needed for pain.     acetic acid-hydrocortisone (VOSOL-HC) OTIC  solution Place 3 drops into both ears 3 (three) times daily. 10 mL 0   ALPRAZolam (XANAX) 0.5 MG tablet Take 0.5 mg by mouth 2 (two) times daily as needed for anxiety.     Aspirin-Acetaminophen-Caffeine (EXCEDRIN PO) Take by mouth.     ibuprofen (ADVIL) 200 MG tablet Take 200 mg by mouth every 6 (six) hours as needed.     TURMERIC PO turmeric     valACYclovir (VALTREX) 500 MG tablet Take 500 mg by mouth daily as needed.      No facility-administered medications prior to visit.    No Known Allergies  Review of Systems  See pertinent positives and negatives per HPI.    Objective:    Physical Exam Vitals and nursing note reviewed.  Constitutional:      General: She is not in acute distress.    Appearance: Normal appearance.  HENT:     Head: Normocephalic.  Eyes:     Conjunctiva/sclera: Conjunctivae normal.  Cardiovascular:     Rate and Rhythm: Normal rate and regular rhythm.     Pulses: Normal pulses.     Heart  sounds: Normal heart sounds.  Pulmonary:     Effort: Pulmonary effort is normal.     Breath sounds: Normal breath sounds.  Musculoskeletal:        General: Tenderness (one area to left chest wall under left breast) present. No swelling. Normal range of motion.     Cervical back: Normal range of motion.  Skin:    General: Skin is warm.     Findings: No bruising or rash.  Neurological:     General: No focal deficit present.     Mental Status: She is alert and oriented to person, place, and time.  Psychiatric:        Mood and Affect: Mood normal.        Behavior: Behavior normal.        Thought Content: Thought content normal.        Judgment: Judgment normal.    BP 102/80    Pulse 81    Temp (!) 97.1 F (36.2 C) (Temporal)    Ht 5\' 5"  (1.651 m)    Wt 238 lb (108 kg)    SpO2 98%    BMI 39.61 kg/m  Wt Readings from Last 3 Encounters:  02/21/21 238 lb (108 kg)  12/19/20 237 lb (107.5 kg)  12/19/19 220 lb (99.8 kg)    Health Maintenance Due  Topic Date Due   COVID-19 Vaccine (3 - Booster for Pfizer series) 07/16/2019   PAP SMEAR-Modifier  12/15/2020    There are no preventive care reminders to display for this patient.   Lab Results  Component Value Date   TSH 2.58 12/19/2020   Lab Results  Component Value Date   WBC 6.7 12/19/2019   HGB 13.4 12/19/2019   HCT 41.0 12/19/2019   MCV 84.6 12/19/2019   PLT 186.0 12/19/2019   Lab Results  Component Value Date   NA 137 12/19/2020   K 4.7 12/19/2020   CO2 28 12/19/2020   GLUCOSE 99 12/19/2020   BUN 13 12/19/2020   CREATININE 0.95 12/19/2020   BILITOT 0.6 12/19/2020   ALKPHOS 42 12/19/2020   AST 17 12/19/2020   ALT 16 12/19/2020   PROT 7.3 12/19/2020   ALBUMIN 4.3 12/19/2020   CALCIUM 9.5 12/19/2020   GFR 73.59 12/19/2020   Lab Results  Component Value Date   CHOL 178 12/19/2020   Lab  Results  Component Value Date   HDL 51.20 12/19/2020   Lab Results  Component Value Date   LDLCALC 109 (H) 12/19/2020    Lab Results  Component Value Date   TRIG 87.0 12/19/2020   Lab Results  Component Value Date   CHOLHDL 3 12/19/2020   Lab Results  Component Value Date   HGBA1C 5.9 12/19/2020       Assessment & Plan:   Problem List Items Addressed This Visit   None Visit Diagnoses     Chest wall pain    -  Primary   No red flags on exam. Most likely musculoskeletal. Start naproxen 500mg  BID with food and heat/ice prn. Daily stretches printed. Splint area when cough/sneeze        Meds ordered this encounter  Medications   naproxen (NAPROSYN) 500 MG tablet    Sig: Take 1 tablet (500 mg total) by mouth 2 (two) times daily with a meal.    Dispense:  30 tablet    Refill:  0     Gerre ScullLauren A Nicholai Willette, NP

## 2021-02-21 ENCOUNTER — Other Ambulatory Visit: Payer: Self-pay

## 2021-02-21 ENCOUNTER — Encounter: Payer: Self-pay | Admitting: Nurse Practitioner

## 2021-02-21 ENCOUNTER — Ambulatory Visit: Payer: BC Managed Care – PPO | Admitting: Nurse Practitioner

## 2021-02-21 VITALS — BP 102/80 | HR 81 | Temp 97.1°F | Ht 65.0 in | Wt 238.0 lb

## 2021-02-21 DIAGNOSIS — R0789 Other chest pain: Secondary | ICD-10-CM | POA: Diagnosis not present

## 2021-02-21 MED ORDER — NAPROXEN 500 MG PO TABS: 500.0000 mg | ORAL_TABLET | Freq: Two times a day (BID) | ORAL | 0 refills | Status: DC

## 2021-02-21 NOTE — Patient Instructions (Signed)
It was great to see you!  Start naproxen twice a day. I have sent this to your pharmacy. Take this with food.  You can also use ice/heat and splint the area when you cough or sneeze.   Do these stretches daily.   Follow up in 1 week if your symptoms are not improving.   Take care,  Vance Peper, NP

## 2021-03-31 ENCOUNTER — Encounter: Payer: Self-pay | Admitting: Nurse Practitioner

## 2021-04-01 ENCOUNTER — Encounter: Payer: Self-pay | Admitting: Nurse Practitioner

## 2021-05-08 DIAGNOSIS — Z113 Encounter for screening for infections with a predominantly sexual mode of transmission: Secondary | ICD-10-CM | POA: Diagnosis not present

## 2021-05-08 DIAGNOSIS — L659 Nonscarring hair loss, unspecified: Secondary | ICD-10-CM | POA: Diagnosis not present

## 2021-05-08 DIAGNOSIS — Z114 Encounter for screening for human immunodeficiency virus [HIV]: Secondary | ICD-10-CM | POA: Diagnosis not present

## 2021-05-08 DIAGNOSIS — Z1159 Encounter for screening for other viral diseases: Secondary | ICD-10-CM | POA: Diagnosis not present

## 2021-05-08 DIAGNOSIS — Z1231 Encounter for screening mammogram for malignant neoplasm of breast: Secondary | ICD-10-CM | POA: Diagnosis not present

## 2021-05-08 DIAGNOSIS — L280 Lichen simplex chronicus: Secondary | ICD-10-CM | POA: Diagnosis not present

## 2021-07-07 DIAGNOSIS — N898 Other specified noninflammatory disorders of vagina: Secondary | ICD-10-CM | POA: Diagnosis not present

## 2021-08-11 ENCOUNTER — Encounter: Payer: Self-pay | Admitting: Allergy

## 2021-08-11 ENCOUNTER — Ambulatory Visit: Payer: BC Managed Care – PPO | Admitting: Allergy

## 2021-08-11 VITALS — BP 132/72 | HR 80 | Temp 98.6°F | Ht 65.0 in | Wt 237.0 lb

## 2021-08-11 DIAGNOSIS — T781XXA Other adverse food reactions, not elsewhere classified, initial encounter: Secondary | ICD-10-CM

## 2021-08-11 DIAGNOSIS — R768 Other specified abnormal immunological findings in serum: Secondary | ICD-10-CM

## 2021-08-11 DIAGNOSIS — T781XXD Other adverse food reactions, not elsewhere classified, subsequent encounter: Secondary | ICD-10-CM | POA: Insufficient documentation

## 2021-08-11 DIAGNOSIS — J3089 Other allergic rhinitis: Secondary | ICD-10-CM | POA: Insufficient documentation

## 2021-08-11 NOTE — Assessment & Plan Note (Signed)
Patient had multiple false positive RPR screening since 2014.  Reflex test is always negative.  No prior [place] this diagnosis or treatment.  She usually gets STD testing every 6 months. Denies any frequent infections/antibiotics use.  . Discussed with patient that there is no indication for any immune evaluation from immunology standpoint with the lack of infections/antibiotics use. . Some people do get false positive RPR screening. . Recommend infectious disease evaluation next.

## 2021-08-11 NOTE — Patient Instructions (Addendum)
  RPR screening No indication for any testing from my perspective. Recommend infectious disease evaluation next.   Today's skin testing showed: Positive to grass, ragweed, trees. Borderline to weed pollen. Negative to select foods.   Results given.  Food: Continue to avoid fresh avocados, fresh bananas, apples and watermelon. Discussed that her food triggered oral and throat symptoms are likely caused by oral food allergy syndrome (OFAS). This is caused by cross reactivity of pollen with fresh fruits and vegetables, and nuts. Symptoms are usually localized in the form of itching and burning in mouth and throat. Very rarely it can progress to more severe symptoms. Eating foods in cooked or processed forms usually minimizes symptoms. I recommended avoidance of eating the problem foods, especially during the peak season(s). Sometimes, OFAS can induce severe throat swelling or even a systemic reaction; with such instance, I advised them to report to a local ER. A list of common pollens and food cross-reactivities was provided to the patient.   Environmental allergies Start environmental control measures as below. Use over the counter antihistamines such as Zyrtec (cetirizine), Claritin (loratadine), Allegra (fexofenadine), or Xyzal (levocetirizine) daily as needed. May take twice a day during allergy flares. May switch antihistamines every few months. Use Flonase (fluticasone) nasal spray 1 spray per nostril twice a day as needed for nasal congestion.   Follow up if needed.    Reducing Pollen Exposure Pollen seasons: trees (spring), grass (summer) and ragweed/weeds (fall). Keep windows closed in your home and car to lower pollen exposure.  Install air conditioning in the bedroom and throughout the house if possible.  Avoid going out in dry windy days - especially early morning. Pollen counts are highest between 5 - 10 AM and on dry, hot and windy days.  Save outside activities for late  afternoon or after a heavy rain, when pollen levels are lower.  Avoid mowing of grass if you have grass pollen allergy. Be aware that pollen can also be transported indoors on people and pets.  Dry your clothes in an automatic dryer rather than hanging them outside where they might collect pollen.  Rinse hair and eyes before bedtime.

## 2021-08-11 NOTE — Assessment & Plan Note (Signed)
Noted fresh avocados, watermelon, bananas cause vomiting and fresh apples cause perioral pruritus.  Tolerates processed forms with no issues even the avocado. No prior work-up.  Today's skin prick testing showed: Negative to select foods.  . Continue to avoid fresh avocados, fresh bananas, apples and watermelon. . Discussed that her food triggered oral and throat symptoms are likely caused by oral food allergy syndrome (OFAS). . This is caused by cross reactivity of pollen with fresh fruits and vegetables, and nuts. Symptoms are usually localized in the form of itching and burning in mouth and throat. Very rarely it can progress to more severe symptoms. Eating foods in cooked or processed forms usually minimizes symptoms. I recommended avoidance of eating the problem foods, especially during the peak season(s). Sometimes, OFAS can induce severe throat swelling or even a systemic reaction; with such instance, I advised them to report to a local ER. A list of common pollens and food cross-reactivities was provided to the patient.

## 2021-08-11 NOTE — Assessment & Plan Note (Signed)
Rhinoconjunctivitis symptoms in the spring and takes Flonase as needed with good benefit.  No prior work-up.  Today's skin prick testing showed: Positive to grass, ragweed, trees. Borderline to weed pollen.  Start environmental control measures as below.  Use over the counter antihistamines such as Zyrtec (cetirizine), Claritin (loratadine), Allegra (fexofenadine), or Xyzal (levocetirizine) daily as needed. May take twice a day during allergy flares. May switch antihistamines every few months.  Use Flonase (fluticasone) nasal spray 1 spray per nostril twice a day as needed for nasal congestion.

## 2021-08-11 NOTE — Progress Notes (Signed)
New Patient Note  RE: Kathy [name] MRN: [id number] DOB: 08-27-1975 Date of Office Visit: 08/11/2021  Consult requested by: Anne Ng, NP Primary care provider: Anne Ng, NP  Chief Complaint: New Patient (Initial Visit) (Uses Flonase in spring, does not take allergy meds regularly; has never been allergy tested)  History of Present Illness: I had the pleasure of seeing Kathy [name] for initial evaluation at the [facility] on 08/11/2021. She is a 46 y.o. female, who is self-referred here for the evaluation of her immune system and food sensitivity.  Patient has positive rpr screening test but the reflex test is negative. Patient usually get STD testing every 6 months. No prior confirmatory testing. This all started in 2014. No prior syphilis treatment or confirmed testing.  Patient has no history multiple infections including sinus infection, pneumonia, ear infections, GI infections/diarrhea, skin infections/abscesses.  Patient reports 0 antibiotic use in the last 12 months and 0 hospital admissions. Patient does not have any secondary causes of immunodeficiency including chronic steroid use, diabetes mellitus, protein losing enteropathy, renal or hepatic dysfunction, history of cancer or irradiation or history of HIV, hepatitis B or C.  Food: She reports food allergy to avocados.  She usually has vomiting with fresh avocados. Tolerates avocados if cooked within a soup.   Sometimes she has vomiting with watermelon and bananas. No issues with banana bread.  Sometimes apples cause perioral pruritus.   Past work up includes: none. Dietary History: patient has been eating other foods including milk, eggs, peanut, treenuts, sesame, shellfish, fish, soy, wheat, meats, fruits and vegetables.  Assessment and Plan: Kathy [name] is a 46 y.o. female with: Oral allergy syndrome, subsequent encounter Noted fresh avocados, watermelon, bananas cause  vomiting and fresh apples cause perioral pruritus.  Tolerates processed forms with no issues even the avocado. No prior work-up. Today's skin prick testing showed: Negative to select foods.  Continue to avoid fresh avocados, fresh bananas, apples and watermelon. Discussed that her food triggered oral and throat symptoms are likely caused by oral food allergy syndrome (OFAS). This is caused by cross reactivity of pollen with fresh fruits and vegetables, and nuts. Symptoms are usually localized in the form of itching and burning in mouth and throat. Very rarely it can progress to more severe symptoms. Eating foods in cooked or processed forms usually minimizes symptoms. I recommended avoidance of eating the problem foods, especially during the peak season(s). Sometimes, OFAS can induce severe throat swelling or even a systemic reaction; with such instance, I advised them to report to a local ER. A list of common pollens and food cross-reactivities was provided to the patient.   Other allergic rhinitis Rhinoconjunctivitis symptoms in the spring and takes Flonase as needed with good benefit.  No prior work-up. Today's skin prick testing showed: Positive to grass, ragweed, trees. Borderline to weed pollen. Start environmental control measures as below. Use over the counter antihistamines such as Zyrtec (cetirizine), Claritin (loratadine), Allegra (fexofenadine), or Xyzal (levocetirizine) daily as needed. May take twice a day during allergy flares. May switch antihistamines every few months. Use Flonase (fluticasone) nasal spray 1 spray per nostril twice a day as needed for nasal congestion.   Biological false positive RPR test Patient had multiple false positive RPR screening since 2014.  Reflex test is always negative.  No prior [place] this diagnosis or treatment.  She usually gets STD testing every 6 months. Denies any frequent infections/antibiotics use.  Discussed with patient that there  is no  indication for any immune evaluation from immunology standpoint with the lack of infections/antibiotics use. Some people do get false positive RPR screening. Recommend infectious disease evaluation next.   Return if symptoms worsen or fail to improve.  No orders of the defined types were placed in this encounter.  Lab Orders  No laboratory test(s) ordered today    Other allergy screening: Asthma: no Rhino conjunctivitis: yes Some sneezing and itchy eyes in the spring time only. Takes Flonase prn with good benefit.   Medication allergy: no Hymenoptera allergy: no Urticaria: no Eczema: in the past on the antecubital fossa are.   Diagnostics: Skin Testing: Environmental allergy panel and select foods. Positive to grass, ragweed, trees. Borderline to weed pollen. Negative to select foods.  Results discussed with patient/family.  Airborne Adult Perc - 08/11/21 1500     Time Antigen Placed 1518    Allergen Manufacturer Waynette Buttery    Location Back    Number of Test 59    1. Control-Buffer 50% Glycerol Negative    2. Control-Histamine 1 mg/ml 2+    3. Albumin saline Negative    4. Bahia Negative    5. French Southern Territ[place] --   +-   6. Johnson Negative    7. Kentucky Blue 2+    8. Meadow Fescue 4+    9. Perennial Rye 4+    10. Sweet Vernal 2+    11. Timothy Negative    12. Cocklebur Negative    13. Burweed Marshelder Negative    14. Ragweed, short 2+    15. Ragweed, Giant --   +-   16. Plantain,  English Negative    17. Lamb's Quarters --   +-   18. Sheep Sorrell Negative    19. Rough Pigweed Negative    20. Marsh Elder, Rough Negative    21. Mugwort, Common Negative    22. Ash mix Negative    23. Birch mix Negative    24. Beech American Negative    25. Box, Elder 2+    26. Cedar, red Negative    27. Cottonwood, Guinea-Bissau Negative    28. Elm mix Negative    29. Hickory Negative    30. Maple mix --   +-   31. Oak, Guinea-Bissau mix 2+    32. Pecan Pollen Negative    33. Pine mix Negative     34. Sycamore Eastern Negative    35. Walnut, Black Pollen Negative    36. Alternaria alternata Negative    37. Cladosporium Herbarum Negative    38. Aspergillus mix Negative    39. Penicillium mix Negative    40. Bipolaris sorokiniana (Helminthosporium) Negative    41. Drechslera spicifera (Curvularia) Negative    42. Mucor plumbeus Negative    43. Fusarium moniliforme Negative    44. Aureobasidium pullulans (pullulara) Negative    45. Rhizopus oryzae Negative    46. Botrytis cinera Negative    47. Epicoccum nigrum Negative    48. Phoma betae Negative    49. Candida Albicans Negative    50. Trichophyton mentagrophytes Negative    51. Mite, D Farinae  5,000 AU/ml 2+    52. Mite, D Pteronyssinus  5,000 AU/ml Negative    53. Cat Hair 10,000 BAU/ml Negative    54.  Dog Epithelia Negative    55. Mixed Feathers Negative    56. Horse Epithelia Negative    57. Cockroach, German Negative    58. Mouse Negative  59. Tobacco Leaf Negative             Food Adult Perc - 08/11/21 1500     Time Antigen Placed 1520    Allergen Manufacturer Waynette ButteryGreer    Location Back    Number of allergen test 4     Control-buffer 50% Glycerol Negative    Control-Histamine 1 mg/ml 2+    48. Avocado Negative    57. Banana Negative    58. Apple Negative    62. 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   Hypermobility  arthralgia 05/26/2018   Vitamin D deficiency 03/24/2018   Family history of systemic lupus erythematosus 03/24/2018   HSV (herpes simplex virus) infection 03/24/2018   History of anxiety 03/24/2018   History of hyperlipidemia 03/24/2018   Chest pain 02/11/2017   Biological false positive RPR test 11/26/2016   Past Medical History:  Diagnosis Date   Anxiety    Arthritis    Back pain    Chest pain    Dizziness    Fatigue    Generalized headaches    Hand tingling    Hip pain    HLD (hyperlipidemia)    HSV (herpes simplex virus) infection    Knee pain    Multiple food allergies    Avocado, Banana, [name], [name], sometimes apples:  Nausea, vomitting itching in throat   Palpitations    Postpartum care following vaginal delivery 10/05/2010   Past Surgical History: Past Surgical History:  Procedure Laterality Date   NO PAST SURGERIES     Medication List:  Current Outpatient Medications  Medication Sig Dispense Refill   acetaminophen (TYLENOL) 650 MG CR tablet Take 650 mg by mouth every 8 (eight) hours as needed for pain.     acetic acid-hydrocortisone (VOSOL-HC) OTIC solution Place 3 drops into both ears 3 (three) times daily. 10 mL 0   ALPRAZolam (XANAX) 0.5 MG tablet Take 0.5 mg by mouth 2 (two) times daily as needed for anxiety.     Aspirin-Acetaminophen-Caffeine (EXCEDRIN PO) Take by mouth.     ergocalciferol (VITAMIN D2) 1.25 MG (50000 UT) capsule Take 50,000 Units by mouth once a week.     ibuprofen (ADVIL) 200 MG tablet Take 200 mg by mouth every 6 (six) hours as needed.     Multiple Vitamin (MULTIVITAMIN) tablet Take 1 tablet by mouth daily.     TURMERIC PO turmeric     valACYclovir (VALTREX) 500 MG tablet Take 500 mg by mouth daily as needed.      No current facility-administered medications for this visit.   Allergies: No Known Allergies Social History: Social History   Socioeconomic History   Marital status: Single    Spouse name: Not on file   Number  of children: Not on file   Years of education: Not on file   Highest education level: Not on file  Occupational History  Occupation: Water quality scientist: [employer]  Tobacco Use   Smoking status: Former    Packs/day: 1.00    Years: 10.00    Total pack years: 10.00    Types: Cigarettes    Quit date: 2007    Years since quitting: 16.5   Smokeless tobacco: Never  Vaping Use   Vaping Use: Never used  Substance and Sexual Activity   Alcohol use: Yes    Comment: rare on ocassion   Drug use: No   Sexual activity: Yes    Partners: Male  Other Topics Concern   Not on file  Social History Narrative   Not on file   Social Determinants of Health   Financial Resource Strain: Not on file  Food Insecurity: Not on file  Transportation Needs: Not on file  Physical Activity: Not on file  Stress: Not on file  Social Connections: Not on file   Lives in a 44 year old house. Smoking: denies Occupation: Therapist, music.  Environmental History: Water Damage/mildew in the house:  not sure Carpet in the family room: no Carpet in the bedroom: yes Heating:  gas and electric Cooling: central Pet: no  Family History: Family History  Problem Relation Age of Onset   Lymphoma Mother    Breast cancer Maternal Grandmother    Diabetes Maternal Grandmother    Diabetes Maternal Grandfather    Cancer Maternal Grandfather        Unsure if Prostate or colon   Diabetes Paternal Grandmother    Diabetes Paternal Grandfather    Obesity Father    Problem                               Relation Asthma                                   no Eczema                                no Food allergy                          no Allergic rhino conjunctivitis     daughter   Review of Systems  Constitutional:  Negative for appetite change, chills, fever and unexpected weight change.  HENT:  Negative for congestion and rhinorrhea.   Eyes:  Negative for itching.  Respiratory:  Negative for  cough, chest tightness, shortness of breath and wheezing.   Cardiovascular:  Negative for chest pain.  Gastrointestinal:  Negative for abdominal pain.  Genitourinary:  Negative for difficulty urinating.  Skin:  Negative for rash.  Allergic/Immunologic: Positive for environmental allergies.  Neurological:  Negative for headaches.    Objective: BP 132/72   Pulse 80   Temp 98.6 F (37 C) (Esophageal)   Ht 5\' 5"  (1.651 m)   Wt 237 lb (107.5 kg)   SpO2 97%   BMI 39.44 kg/m  Body mass index is 39.44 kg/m. Physical Exam Vitals and nursing note reviewed.  Constitutional:      Appearance: Normal appearance. She is well-developed.  HENT:     Head: Normocephalic and atraumatic.     Right Ear: Tympanic membrane and external ear normal.     Left Ear: Tympanic membrane and external ear  normal.     Nose: Nose normal.     Mouth/Throat:     Mouth: Mucous membranes are moist.     Pharynx: Oropharynx is clear.  Eyes:     Conjunctiva/sclera: Conjunctivae normal.  Cardiovascular:     Rate and Rhythm: Normal rate and regular rhythm.     Heart sounds: Normal heart sounds. No murmur heard.    No friction rub. No gallop.  Pulmonary:     Effort: Pulmonary effort is normal.     Breath sounds: Normal breath sounds. No wheezing, rhonchi or rales.  Musculoskeletal:     Cervical back: Neck supple.  Skin:    General: Skin is warm.     Findings: No rash.  Neurological:     Mental Status: She is alert and oriented to person, place, and time.  Psychiatric:        Behavior: Behavior normal.   The plan was reviewed with the patient/family, and all questions/concerned were addressed.  It was my pleasure to see Emelee today and participate in her care. Please feel free to contact me with any questions or concerns.  Sincerely,  Wyline Mood, DO Allergy & Immunology  [facility] office: [phone] [facility] office: [phone]

## 2021-11-18 DIAGNOSIS — Z01419 Encounter for gynecological examination (general) (routine) without abnormal findings: Secondary | ICD-10-CM | POA: Diagnosis not present

## 2021-11-18 DIAGNOSIS — Z113 Encounter for screening for infections with a predominantly sexual mode of transmission: Secondary | ICD-10-CM | POA: Diagnosis not present

## 2021-11-18 DIAGNOSIS — Z1231 Encounter for screening mammogram for malignant neoplasm of breast: Secondary | ICD-10-CM | POA: Diagnosis not present

## 2021-11-18 DIAGNOSIS — F32A Depression, unspecified: Secondary | ICD-10-CM | POA: Diagnosis not present

## 2021-11-18 DIAGNOSIS — Z114 Encounter for screening for human immunodeficiency virus [HIV]: Secondary | ICD-10-CM | POA: Diagnosis not present

## 2021-11-18 DIAGNOSIS — R87610 Atypical squamous cells of undetermined significance on cytologic smear of cervix (ASC-US): Secondary | ICD-10-CM | POA: Diagnosis not present

## 2021-11-18 DIAGNOSIS — Z1159 Encounter for screening for other viral diseases: Secondary | ICD-10-CM | POA: Diagnosis not present

## 2021-11-18 DIAGNOSIS — R82998 Other abnormal findings in urine: Secondary | ICD-10-CM | POA: Diagnosis not present

## 2021-11-18 DIAGNOSIS — R42 Dizziness and giddiness: Secondary | ICD-10-CM | POA: Diagnosis not present

## 2021-11-18 DIAGNOSIS — Z118 Encounter for screening for other infectious and parasitic diseases: Secondary | ICD-10-CM | POA: Diagnosis not present

## 2021-11-18 LAB — HM MAMMOGRAPHY

## 2021-11-19 DIAGNOSIS — Z0000 Encounter for general adult medical examination without abnormal findings: Secondary | ICD-10-CM | POA: Diagnosis not present

## 2021-11-19 DIAGNOSIS — Z131 Encounter for screening for diabetes mellitus: Secondary | ICD-10-CM | POA: Diagnosis not present

## 2021-11-19 DIAGNOSIS — Z13220 Encounter for screening for lipoid disorders: Secondary | ICD-10-CM | POA: Diagnosis not present

## 2021-11-19 DIAGNOSIS — Z1329 Encounter for screening for other suspected endocrine disorder: Secondary | ICD-10-CM | POA: Diagnosis not present

## 2021-12-01 LAB — HM PAP SMEAR

## 2021-12-05 ENCOUNTER — Emergency Department (HOSPITAL_COMMUNITY): Payer: BC Managed Care – PPO

## 2021-12-05 ENCOUNTER — Emergency Department (HOSPITAL_COMMUNITY)
Admission: EM | Admit: 2021-12-05 | Discharge: 2021-12-06 | Disposition: A | Discharge status: Home / Self Care | Payer: BC Managed Care – PPO | Attending: Emergency Medicine | Admitting: Emergency Medicine

## 2021-12-05 ENCOUNTER — Encounter (HOSPITAL_COMMUNITY): Payer: Self-pay | Admitting: *Deleted

## 2021-12-05 ENCOUNTER — Other Ambulatory Visit: Payer: Self-pay

## 2021-12-05 DIAGNOSIS — M5459 Other low back pain: Secondary | ICD-10-CM | POA: Diagnosis not present

## 2021-12-05 DIAGNOSIS — M549 Dorsalgia, unspecified: Secondary | ICD-10-CM | POA: Diagnosis not present

## 2021-12-05 DIAGNOSIS — M545 Low back pain, unspecified: Secondary | ICD-10-CM | POA: Insufficient documentation

## 2021-12-05 DIAGNOSIS — Z7982 Long term (current) use of aspirin: Secondary | ICD-10-CM | POA: Insufficient documentation

## 2021-12-05 LAB — I-STAT BETA HCG BLOOD, ED (MC, WL, AP ONLY): I-stat hCG, quantitative: <5 m[IU]/mL (ref <5)

## 2021-12-05 MED ORDER — HYDROCODONE-ACETAMINOPHEN 5-325 MG PO TABS
1.0000 | ORAL_TABLET | Freq: Once | ORAL | Status: AC
  Administered 2021-12-05: 1 via ORAL
  Filled 2021-12-05: qty 1

## 2021-12-05 NOTE — ED Triage Notes (Signed)
The pt arrrived by gems from home lower back pain  after she bent down to tie her shoe

## 2021-12-05 NOTE — ED Notes (Signed)
Patient educated about not driving or performing other critical tasks (such as operating heavy machinery, caring for infant/toddler/child) due to sedative nature of narcotic medications received while in the ED.  Pt/caregiver verbalized understanding.   

## 2021-12-05 NOTE — ED Triage Notes (Signed)
Midline lower back pain that started about 1300 today. Took 800 mg ibuprofen and a Robaxin for pain. Denies numbness and tingling.

## 2021-12-05 NOTE — ED Provider Triage Note (Signed)
Emergency Medicine Provider Triage Evaluation Note  Kathy [name] , a 46 y.o. female  was evaluated in triage.  Pt complains of low back pain after trying to pull shoe off around 1PM. Has tried ibuprofen 800mg , muscle relaxer and lidocaine patch w/o relief. No pain to BLE. Reports pain w/sitting up or standing, feels pressurized and uncomfortable.  Review of Systems  Positive: Back pain Negative: Urinary, bowel symptoms  Physical Exam  BP 116/79   Pulse 93   Temp 98.9 F (37.2 C)   Resp 16   SpO2 100%  Gen:   Awake, no distress   Resp:  Normal effort  MSK:   Moves extremities without difficulty  Other:  Ttp of midline L6/7 spine, no stepoffs  Medical Decision Making  Medically screening exam initiated at 7:22 PM.  Appropriate orders placed.  Kathy [name] was informed that the remainder of the evaluation will be completed by another provider, this initial triage assessment does not replace that evaluation, and the importance of remaining in the ED until their evaluation is complete.    Kathy [name], [place] 12/05/21 1926

## 2021-12-06 MED ORDER — KETOROLAC TROMETHAMINE 60 MG/2ML IM SOLN
30.0000 mg | Freq: Once | INTRAMUSCULAR | Status: AC
  Administered 2021-12-06: 30 mg via INTRAMUSCULAR
  Filled 2021-12-06: qty 2

## 2021-12-06 MED ORDER — LIDOCAINE 5 % EX PTCH
1.0000 | MEDICATED_PATCH | CUTANEOUS | Status: DC
  Administered 2021-12-06: 1 via TRANSDERMAL
  Filled 2021-12-06: qty 1

## 2021-12-06 MED ORDER — IBUPROFEN 600 MG PO TABS: 600.0000 mg | ORAL_TABLET | Freq: Four times a day (QID) | ORAL | 0 refills | Status: AC | PRN

## 2021-12-06 MED ORDER — METHOCARBAMOL 500 MG PO TABS: 500.0000 mg | ORAL_TABLET | Freq: Two times a day (BID) | ORAL | 0 refills | Status: DC

## 2021-12-06 MED ORDER — METHOCARBAMOL 500 MG PO TABS
1000.0000 mg | ORAL_TABLET | Freq: Once | ORAL | Status: AC
  Administered 2021-12-06: 1000 mg via ORAL
  Filled 2021-12-06: qty 2

## 2021-12-06 NOTE — ED Provider Notes (Signed)
[place] EMERGENCY DEPARTMENT Provider Note   CSN: [id number] Arrival date & time: 12/05/21  1904     History {Add pertinent medical, surgical, social history, OB history to HPI:1} Chief Complaint  Patient presents with   Back Pain    Kathy [name] is a 46 y.o. female.   Back Pain      Home Medications Prior to Admission medications   Medication Sig Start Date End Date Taking? Authorizing Provider  acetaminophen (TYLENOL) 650 MG CR tablet Take 650 mg by mouth every 8 (eight) hours as needed for pain.    [provider]  acetic acid-hydrocortisone (VOSOL-HC) OTIC solution Place 3 drops into both ears 3 (three) times daily. 12/19/20   Nche, Charlene Brooke, NP  ALPRAZolam Duanne Moron) 0.5 MG tablet Take 0.5 mg by mouth 2 (two) times daily as needed for anxiety.    [provider]  Aspirin-Acetaminophen-Caffeine (EXCEDRIN PO) Take by mouth.    [provider]  ergocalciferol (VITAMIN D2) 1.25 MG (50000 UT) capsule Take 50,000 Units by mouth once a week.    [provider]  ibuprofen (ADVIL) 200 MG tablet Take 200 mg by mouth every 6 (six) hours as needed.    [provider]  Multiple Vitamin (MULTIVITAMIN) tablet Take 1 tablet by mouth daily.    [provider]  TURMERIC PO turmeric    [provider]  valACYclovir (VALTREX) 500 MG tablet Take 500 mg by mouth daily as needed.     [provider]      Allergies    Patient has no known allergies.    Review of Systems   Review of Systems  Musculoskeletal:  Positive for back pain.    Physical Exam Updated Vital Signs BP 116/80   Pulse 64   Temp 98.7 F (37.1 C)   Resp 18   Ht 5\' 5"  (1.651 m)   Wt 107 kg   SpO2 95%   BMI 39.27 kg/m  Physical Exam  ED Results / Procedures / Treatments   Labs (all labs ordered are listed, but only abnormal results are displayed) Labs Reviewed  I-STAT BETA HCG BLOOD, ED ([facility], [facility], [facility] ONLY)     EKG None  Radiology CT Lumbar Spine Wo Contrast  Result Date: 12/05/2021 CLINICAL DATA:  Low back pain, trauma low back pain severe EXAM: CT LUMBAR SPINE WITHOUT CONTRAST TECHNIQUE: Multidetector CT imaging of the lumbar spine was performed without intravenous contrast administration. Multiplanar CT image reconstructions were also generated. RADIATION DOSE REDUCTION: This exam was performed according to the departmental dose-optimization program which includes automated exposure control, adjustment of the mA and/or kV according to patient size and/or use of iterative reconstruction technique. COMPARISON:  X-ray 03/24/2018 FINDINGS: Segmentation: 5 lumbar type vertebrae. Alignment: Normal. Vertebrae: No acute fracture or focal pathologic process. Paraspinal and other soft tissues: Negative. Disc levels: Intervertebral disc heights are preserved. No appreciable disc herniation. Facet joints within normal limits. No evidence of foraminal or canal stenosis by CT. IMPRESSION: No acute fracture or traumatic malalignment of the lumbar spine. Electronically Signed   By: Davina Poke D.O.   On: 12/05/2021 21:37    Procedures Procedures  {Document cardiac monitor, telemetry assessment procedure when appropriate:1}  Medications Ordered in ED Medications  lidocaine (LIDODERM) 5 % 1 patch (1 patch Transdermal Patch Applied 12/06/21 0248)  HYDROcodone-acetaminophen (NORCO/VICODIN) 5-325 MG per tablet 1 tablet (1 tablet Oral Given 12/05/21 1951)  ketorolac (TORADOL) injection 30 mg (30 mg Intramuscular Given  12/06/21 0249)  methocarbamol (ROBAXIN) tablet 1,000 mg (1,000 mg Oral Given 12/06/21 0249)    ED Course/ Medical Decision Making/ A&P                           Medical Decision Making Risk Prescription drug management.   ***  {Document critical care time when appropriate:1} {Document review of labs and clinical decision tools ie heart score, Chads2Vasc2 etc:1}  {Document your independent  review of radiology images, and any outside records:1} {Document your discussion with family members, caretakers, and with consultants:1} {Document social determinants of health affecting pt's care:1} {Document your decision making why or why not admission, treatments were needed:1} Final Clinical Impression(s) / ED Diagnoses Final diagnoses:  None    Rx / DC Orders ED Discharge Orders     None

## 2021-12-06 NOTE — Discharge Instructions (Signed)
You were seen in the emergency department for evaluation of your back pain.  Your CT abdomen was unremarkable.  This is likely musculoskeletal pain.  Because of this, I am prescribing you 600 mg of ibuprofen to take every 6 hours for the first 48 hours and then as needed afterwards.  I am also prescribing you Robaxin, a muscle relaxer for you to take 1000 mg every 12 hours as needed.  You can apply lidocaine patches to the area as well and these can be found over-the-counter at your local pharmacy or big box store.  If you have any urinary or fecal incontinence, fevers, numbness to your vagina or bottom, weakness, numbness, or tingling, please return to the nearest emergency department for reevaluation.  Otherwise, I have included information for Dr. Clearance Coots who is a sports medicine provider into the discharge for work for you to follow-up with.  Please follow-up with your primary care doctor as well.  If you have any concerns, new or worsening symptoms, please return to the nearest emergency department for reevaluation.  Contact a health care provider if: You have pain that is not relieved with rest or medicine. You have increasing pain going down into your legs or buttocks. Your pain does not improve after 2 weeks. You have pain at night. You lose weight without trying. You have a fever or chills. You develop nausea or vomiting. You develop abdominal pain. Get help right away if: You develop new bowel or bladder control problems. You have unusual weakness or numbness in your arms or legs. You feel faint. These symptoms may represent a serious problem that is an emergency. Do not wait to see if the symptoms will go away. Get medical help right away. Call your local emergency services (911 in the [place] Do not drive yourself to the hospital.

## 2021-12-08 ENCOUNTER — Telehealth: Payer: Self-pay

## 2021-12-08 NOTE — Telephone Encounter (Signed)
Transition Care Management Unsuccessful Follow-up Telephone Call  Date of discharge and from where:  12/06/21 [facility] ED. Dx: Back pain  Attempts:  1st Attempt  Reason for unsuccessful TCM follow-up call:  Left voice message

## 2021-12-08 NOTE — Telephone Encounter (Signed)
Transition Care Management Follow-up Telephone Call Date of discharge and from where: 12/06/21 [facility] ED. Dx: Back pain. How have you been since you were released from the hospital? Still having some back pain Any questions or concerns? Would like to discuss referral to Sports Med or PT for back pain, if no improvement.  Items Reviewed: Did the pt receive and understand the discharge instructions provided? Yes  Medications obtained and verified? Yes  Other? No  Any new allergies since your discharge? No  Dietary orders reviewed? No Do you have support at home? Yes   Home Care and Equipment/Supplies: Were home health services ordered? not applicable If so, what is the name of the agency? N/a  Has the agency set up a time to come to the patient's home? not applicable Were any new equipment or medical supplies ordered?  No What is the name of the medical supply agency? N/a Were you able to get the supplies/equipment? not applicable Do you have any questions related to the use of the equipment or supplies? No  Functional Questionnaire: (I = Independent and D = Dependent) ADLs: I  Bathing/Dressing- I  Meal Prep- I  Eating- I  Maintaining continence- I  Transferring/Ambulation- I  Managing Meds- I  Follow up appointments reviewed:  PCP Hospital f/u appt confirmed? Yes  Scheduled to see Wilfred Lacy on 12/16/21 @ 11:20am. [facility] f/u appt confirmed? No  Scheduled to see n/a on n/a @ n/a. Are transportation arrangements needed? No  If their condition worsens, is the pt aware to call PCP or go to the Emergency Dept.? Yes Was the patient provided with contact information for the PCP's office or ED? Yes Was to pt encouraged to call back with questions or concerns? Yes  Angeline Slim, RN, BSN RN Clinical Supervisor [facility] [employer]

## 2021-12-11 ENCOUNTER — Telehealth: Payer: Self-pay

## 2021-12-11 NOTE — Patient Outreach (Signed)
  Care Coordination TOC Note Transition Care Management Follow-up Telephone Call Date of discharge and from where: [facility]11/3/23 How have you been since you were released from the hospital? Follow up on patients response to EMMI red flag for sad, hopeless anxious or empty Any questions or concerns? Yes- Patient shared that she is feeling sad due to her back injury and back pain.  She has follow up appt with PCP on 12/16/21 and she will discuss at that time.  Provided #[phone] to call if she feels worse over the next few days or to come to the ED.  Items Reviewed: Did the pt receive and understand the discharge instructions provided? Yes  Medications obtained and verified? Yes  Other? No  Any new allergies since your discharge? No  Dietary orders reviewed? No Do you have support at home? Yes   Home Care and Equipment/Supplies: Were home health services ordered? no If so, what is the name of the agency? N/A  Has the agency set up a time to come to the patient's home? no Were any new equipment or medical supplies ordered?  No What is the name of the medical supply agency? N/A Were you able to get the supplies/equipment? no Do you have any questions related to the use of the equipment or supplies? No  Functional Questionnaire: (I = Independent and D = Dependent) ADLs: EMMI Red flag for depression, ADL's not discussed.  Bathing/Dressing- n/a  Meal Prep- n/a  Eating- n/a  Maintaining continence- n/a  Transferring/Ambulation- n/a  Managing Meds- n/a  Follow up appointments reviewed:  PCP Hospital f/u appt confirmed? Yes  Scheduled to see Alysia Penna, NP on 12/16/21 @ 11:20. Specialist Hospital f/u appt confirmed? No   Are transportation arrangements needed? No  If their condition worsens, is the pt aware to call PCP or go to the Emergency Dept.? Yes Was the patient provided with contact information for the PCP's office or ED? Yes Was to pt encouraged to call back with  questions or concerns? Yes  SDOH assessments and interventions completed:   Yes  Care Coordination Interventions Activated:  Yes   Care Coordination Interventions:  No Care Coordination interventions needed at this time.   Encounter Outcome:  Pt. Visit Completed

## 2021-12-16 ENCOUNTER — Ambulatory Visit: Payer: BC Managed Care – PPO | Admitting: Nurse Practitioner

## 2021-12-16 ENCOUNTER — Encounter: Payer: Self-pay | Admitting: Nurse Practitioner

## 2021-12-16 VITALS — BP 122/64 | HR 70 | Temp 97.5°F | Ht 65.0 in | Wt 237.0 lb

## 2021-12-16 DIAGNOSIS — L280 Lichen simplex chronicus: Secondary | ICD-10-CM | POA: Insufficient documentation

## 2021-12-16 DIAGNOSIS — F419 Anxiety disorder, unspecified: Secondary | ICD-10-CM | POA: Insufficient documentation

## 2021-12-16 DIAGNOSIS — S39012D Strain of muscle, fascia and tendon of lower back, subsequent encounter: Secondary | ICD-10-CM

## 2021-12-16 DIAGNOSIS — A601 Herpesviral infection of perianal skin and rectum: Secondary | ICD-10-CM

## 2021-12-16 DIAGNOSIS — A6000 Herpesviral infection of urogenital system, unspecified: Secondary | ICD-10-CM | POA: Insufficient documentation

## 2021-12-16 MED ORDER — VALACYCLOVIR HCL 1 G PO TABS: 1000.0000 mg | ORAL_TABLET | Freq: Two times a day (BID) | ORAL | 1 refills | Status: DC

## 2021-12-16 MED ORDER — METHOCARBAMOL 500 MG PO TABS: 500.0000 mg | ORAL_TABLET | Freq: Two times a day (BID) | ORAL | 0 refills | Status: DC | PRN

## 2021-12-16 NOTE — Progress Notes (Signed)
Established Patient Visit  Patient: Kathy [name]   DOB: Nov 04, 1975   46 y.o. Female  MRN: [id number] Visit Date: 12/16/2021  Subjective:    Chief Complaint  Patient presents with   [facility] F/u Still has some back pain  No other concerns  Requesting records for pap   Back Pain This is a new problem. The current episode started in the past 7 days. The problem occurs constantly. The problem has been gradually improving since onset. The pain is present in the lumbar spine. The quality of the pain is described as aching. The pain does not radiate. The pain is mild. The symptoms are aggravated by bending, sitting and twisting. Stiffness is present All day. Pertinent negatives include no abdominal pain, bladder incontinence, bowel incontinence, chest pain, dysuria, fever, headaches, leg pain, numbness, paresis, paresthesias, pelvic pain, perianal numbness, tingling, weakness or weight loss. Risk factors include obesity and poor posture. She has tried NSAIDs and muscle relaxant for the symptoms. The treatment provided moderate relief.  Onset of pain after bending over to take shoe off. She was standing and reaching over. Improving pain since ED visit with use of robaxin and ibuprofen. Reviewed ED notes, labs results and CT report.  Reviewed medical, surgical, and social history today  Medications: Outpatient Medications Prior to Visit  Medication Sig   acetaminophen (TYLENOL) 650 MG CR tablet Take 650 mg by mouth every 8 (eight) hours as needed for pain.   acetic acid-hydrocortisone (VOSOL-HC) OTIC solution Place 3 drops into both ears 3 (three) times daily.   ALPRAZolam (XANAX) 0.5 MG tablet Take 0.5 mg by mouth 2 (two) times daily as needed for anxiety.   Aspirin-Acetaminophen-Caffeine (EXCEDRIN PO) Take by mouth.   ergocalciferol (VITAMIN D2) 1.25 MG (50000 UT) capsule Take 50,000 Units by mouth once a week.   ibuprofen (ADVIL) 600 MG tablet Take 1  tablet (600 mg total) by mouth every 6 (six) hours as needed.   Multiple Vitamin (MULTIVITAMIN) tablet Take 1 tablet by mouth daily.   TURMERIC PO turmeric   [DISCONTINUED] methocarbamol (ROBAXIN) 500 MG tablet Take 1 tablet (500 mg total) by mouth 2 (two) times daily. Take 1-2 tablets (500mg  to 1000mg  total) by mouth two times daily.   [DISCONTINUED] valACYclovir (VALTREX) 500 MG tablet Take 500 mg by mouth daily as needed.    No facility-administered medications prior to visit.   Reviewed past medical and social history.   ROS per HPI above      Objective:  BP 122/64 (BP Location: Right Arm, Patient Position: Sitting, Cuff Size: Normal)   Pulse 70   Temp (!) 97.5 F (36.4 C) (Temporal)   Ht 5\' 5"  (1.651 m)   Wt 237 lb (107.5 kg)   SpO2 98%   BMI 39.44 kg/m      Physical Exam Cardiovascular:     Rate and Rhythm: Normal rate.     Pulses: Normal pulses.  Pulmonary:     Effort: Pulmonary effort is normal.  Abdominal:     General: Bowel sounds are normal.  Musculoskeletal:        General: No tenderness.     Lumbar back: No spasms or tenderness. Normal range of motion. Negative right straight leg raise test and negative left straight leg raise test.     Right lower leg: No edema.     Left lower leg: No edema.  Skin:  Findings: Rash present. Rash is vesicular.       Neurological:     Mental Status: She is alert.     No results found for any visits on 12/16/21.    Assessment & Plan:    Problem List Items Addressed This Visit       Genitourinary   Genital herpes simplex - Primary   Relevant Medications   valACYclovir (VALTREX) 1000 MG tablet   Other Visit Diagnoses     Strain of muscle, fascia and tendon of lower back, subsequent encounter       Relevant Medications   methocarbamol (ROBAXIN) 500 MG tablet     Continue ibuprofen and robaxin as needed. Maintain proper body mechanics to avoid further injury. Avoid prolong sitting and lifting >10lbs x  1week. Provided work note.  Return in about 2 weeks (around 12/30/2021) for CPE (fasting).     Alysia Penna, NP

## 2021-12-16 NOTE — Patient Instructions (Addendum)
Continue ibuprofen and robaxin as needed. Maintain proper body mechanics to avoid further injury. Avoid prolong sitting and lifting >10lbs x 1week.  Acute Back Pain, Adult Acute back pain is sudden and usually short-lived. It is often caused by an injury to the muscles and tissues in the back. The injury may result from: A muscle, tendon, or ligament getting overstretched or torn. Ligaments are tissues that connect bones to each other. Lifting something improperly can cause a back strain. Wear and tear (degeneration) of the spinal disks. Spinal disks are circular tissue that provide cushioning between the bones of the spine (vertebrae). Twisting motions, such as while playing sports or doing yard work. A hit to the back. Arthritis. You may have a physical exam, lab tests, and imaging tests to find the cause of your pain. Acute back pain usually goes away with rest and home care. Follow these instructions at home: Managing pain, stiffness, and swelling Take over-the-counter and prescription medicines only as told by your health care provider. Treatment may include medicines for pain and inflammation that are taken by mouth or applied to the skin, or muscle relaxants. Your health care provider may recommend applying ice during the first 24-48 hours after your pain starts. To do this: Put ice in a plastic bag. Place a towel between your skin and the bag. Leave the ice on for 20 minutes, 2-3 times a day. Remove the ice if your skin turns bright red. This is very important. If you cannot feel pain, heat, or cold, you have a greater risk of damage to the area. If directed, apply heat to the affected area as often as told by your health care provider. Use the heat source that your health care provider recommends, such as a moist heat pack or a heating pad. Place a towel between your skin and the heat source. Leave the heat on for 20-30 minutes. Remove the heat if your skin turns bright red. This is  especially important if you are unable to feel pain, heat, or cold. You have a greater risk of getting burned. Activity  Do not stay in bed. Staying in bed for more than 1-2 days can delay your recovery. Sit up and stand up straight. Avoid leaning forward when you sit or hunching over when you stand. If you work at a desk, sit close to it so you do not need to lean over. Keep your chin tucked in. Keep your neck drawn back, and keep your elbows bent at a 90-degree angle (right angle). Sit high and close to the steering wheel when you drive. Add lower back (lumbar) support to your car seat, if needed. Take short walks on even surfaces as soon as you are able. Try to increase the length of time you walk each day. Do not sit, drive, or stand in one place for more than 30 minutes at a time. Sitting or standing for long periods of time can put stress on your back. Do not drive or use heavy machinery while taking prescription pain medicine. Use proper lifting techniques. When you bend and lift, use positions that put less stress on your back: [place] your knees. Keep the load close to your body. Avoid twisting. Exercise regularly as told by your health care provider. Exercising helps your back heal faster and helps prevent back injuries by keeping muscles strong and flexible. Work with a physical therapist to make a safe exercise program, as recommended by your health care provider. Do any exercises as told  by your physical therapist. Lifestyle Maintain a healthy weight. Extra weight puts stress on your back and makes it difficult to have good posture. Avoid activities or situations that make you feel anxious or stressed. Stress and anxiety increase muscle tension and can make back pain worse. Learn ways to manage anxiety and stress, such as through exercise. General instructions Sleep on a firm mattress in a comfortable position. Try lying on your side with your knees slightly bent. If you lie on your  back, put a pillow under your knees. Keep your head and neck in a straight line with your spine (neutral position) when using electronic equipment like smartphones or pads. To do this: Raise your smartphone or pad to look at it instead of bending your head or neck to look down. Put the smartphone or pad at the level of your face while looking at the screen. Follow your treatment plan as told by your health care provider. This may include: Cognitive or behavioral therapy. Acupuncture or massage therapy. Meditation or yoga. Contact a health care provider if: You have pain that is not relieved with rest or medicine. You have increasing pain going down into your legs or buttocks. Your pain does not improve after 2 weeks. You have pain at night. You lose weight without trying. You have a fever or chills. You develop nausea or vomiting. You develop abdominal pain. Get help right away if: You develop new bowel or bladder control problems. You have unusual weakness or numbness in your arms or legs. You feel faint. These symptoms may represent a serious problem that is an emergency. Do not wait to see if the symptoms will go away. Get medical help right away. Call your local emergency services (911 in the [place] Do not drive yourself to the hospital. Summary Acute back pain is sudden and usually short-lived. Use proper lifting techniques. When you bend and lift, use positions that put less stress on your back. Take over-the-counter and prescription medicines only as told by your health care provider, and apply heat or ice as told. This information is not intended to replace advice given to you by your health care provider. Make sure you discuss any questions you have with your health care provider. Document Revised: 04/12/2020 Document Reviewed: 04/12/2020 Elsevier Patient Education  2023 [employer].

## 2021-12-17 ENCOUNTER — Ambulatory Visit: Payer: Self-pay

## 2021-12-17 NOTE — Progress Notes (Signed)
ab 

## 2021-12-30 ENCOUNTER — Encounter: Payer: Self-pay | Admitting: Nurse Practitioner

## 2021-12-30 ENCOUNTER — Ambulatory Visit (INDEPENDENT_AMBULATORY_CARE_PROVIDER_SITE_OTHER): Payer: BC Managed Care – PPO | Admitting: Nurse Practitioner

## 2021-12-30 VITALS — BP 126/76 | HR 72 | Temp 96.4°F | Ht 65.0 in | Wt 236.2 lb

## 2021-12-30 DIAGNOSIS — R739 Hyperglycemia, unspecified: Secondary | ICD-10-CM

## 2021-12-30 DIAGNOSIS — E559 Vitamin D deficiency, unspecified: Secondary | ICD-10-CM

## 2021-12-30 DIAGNOSIS — Z0001 Encounter for general adult medical examination with abnormal findings: Secondary | ICD-10-CM

## 2021-12-30 DIAGNOSIS — Z6839 Body mass index (BMI) 39.0-39.9, adult: Secondary | ICD-10-CM

## 2021-12-30 DIAGNOSIS — R7303 Prediabetes: Secondary | ICD-10-CM

## 2021-12-30 DIAGNOSIS — R7301 Impaired fasting glucose: Secondary | ICD-10-CM | POA: Insufficient documentation

## 2021-12-30 DIAGNOSIS — E7800 Pure hypercholesterolemia, unspecified: Secondary | ICD-10-CM

## 2021-12-30 LAB — LIPID PANEL
Cholesterol: 203 mg/dL — ABNORMAL HIGH (ref 0–200)
HDL: 47.5 mg/dL (ref >39.00)
LDL Cholesterol: 128 mg/dL — ABNORMAL HIGH (ref 0–99)
NonHDL: 155.8
Total CHOL/HDL Ratio: 4
Triglycerides: 140 mg/dL (ref 0.0–149.0)
VLDL: 28 mg/dL (ref 0.0–40.0)

## 2021-12-30 LAB — COMPREHENSIVE METABOLIC PANEL
ALT: 18 U/L (ref 0–35)
AST: 14 U/L (ref 0–37)
Albumin: 4.3 g/dL (ref 3.5–5.2)
Alkaline Phosphatase: 36 U/L — ABNORMAL LOW (ref 39–117)
BUN: 10 mg/dL (ref 6–23)
CO2: 29 mEq/L (ref 19–32)
Calcium: 9 mg/dL (ref 8.4–10.5)
Chloride: 106 mEq/L (ref 96–112)
Creatinine, Ser: 0.83 mg/dL (ref 0.40–1.20)
GFR: 85.91 mL/min (ref >60.00)
Glucose, Bld: 107 mg/dL — ABNORMAL HIGH (ref 70–99)
Potassium: 4.9 mEq/L (ref 3.5–5.1)
Sodium: 141 mEq/L (ref 135–145)
Total Bilirubin: 0.4 mg/dL (ref 0.2–1.2)
Total Protein: 6.8 g/dL (ref 6.0–8.3)

## 2021-12-30 LAB — CBC
HCT: 40.5 % (ref 36.0–46.0)
Hemoglobin: 13.4 g/dL (ref 12.0–15.0)
MCHC: 33.1 g/dL (ref 30.0–36.0)
MCV: 83.5 fl (ref 78.0–100.0)
Platelets: 179 10*3/uL (ref 150.0–400.0)
RBC: 4.85 Mil/uL (ref 3.87–5.11)
RDW: 14.1 % (ref 11.5–15.5)
WBC: 5.9 10*3/uL (ref 4.0–10.5)

## 2021-12-30 LAB — VITAMIN D 25 HYDROXY (VIT D DEFICIENCY, FRACTURES): VITD: 45.97 ng/mL (ref 30.00–100.00)

## 2021-12-30 LAB — HEMOGLOBIN A1C: Hgb A1c MFr Bld: 6.1 % (ref 4.6–6.5)

## 2021-12-30 NOTE — Progress Notes (Signed)
Complete physical exam  Patient: Kathy [name]   DOB: Aug 09, 1975   46 y.o. Female  MRN: [id number] Visit Date: 12/30/2021  Subjective:    Chief Complaint  Patient presents with   Annual Exam    Cpe Pt fasting  Joint pain    Kathy [name] is a 46 y.o. female who presents today for a complete physical exam. She reports consuming a general diet.  No exercise at this time due to back pain  She generally feels well. She reports sleeping fairly well. She does have additional problems to discuss today.  Vision:Yes Dental:Yes STD Screen:No  BP Readings from Last 3 Encounters:  12/30/21 126/76  12/16/21 122/64  12/06/21 116/80    Wt Readings from Last 3 Encounters:  12/30/21 236 lb 3.2 oz (107.1 kg)  12/16/21 237 lb (107.5 kg)  12/05/21 236 lb (107 kg)     Most recent fall risk assessment:    12/30/2021    8:54 AM  Fall Risk   Falls in the past year? 0  Number falls in past yr: 0  Injury with Fall? 0     Depression screen:Yes - Depression  Most recent depression screenings:    12/30/2021    9:13 AM 12/11/2021    1:56 PM  PHQ 2/9 Scores  PHQ - 2 Score 2 4  PHQ- 9 Score 5   Exception Documentation  Medical reason    HPI  No problem-specific Assessment & Plan notes found for this encounter.   Past Medical History:  Diagnosis Date   Anxiety    Arthritis    Back pain    Chest pain    Dizziness    Fatigue    Generalized headaches    Hand tingling    Hip pain    HLD (hyperlipidemia)    HSV (herpes simplex virus) infection    Knee pain    Multiple food allergies    Avocado, Banana, [name], [name], sometimes apples:  Nausea, vomitting itching in throat   Palpitations    Postpartum care following vaginal delivery 10/05/2010   Past Surgical History:  Procedure Laterality Date   NO PAST SURGERIES     Social History   Socioeconomic History   Marital status: Single    Spouse name: Not on file   Number of children: Not on file   Years of  education: Not on file   Highest education level: Not on file  Occupational History   Occupation: Client Associate    Employer: [employer]  Tobacco Use   Smoking status: Former    Packs/day: 1.00    Years: 10.00    Total pack years: 10.00    Types: Cigarettes    Quit date: 2007    Years since quitting: 16.9   Smokeless tobacco: Never  Vaping Use   Vaping Use: Never used  Substance and Sexual Activity   Alcohol use: Yes    Comment: rare on ocassion   Drug use: No   Sexual activity: Yes    Partners: Male  Other Topics Concern   Not on file  Social History Narrative   Not on file   Social Determinants of Health   Financial Resource Strain: Not on file  Food Insecurity: No Food Insecurity (12/11/2021)   Hunger Vital Sign    Worried About Running Out of Food in the Last Year: Never true    Ran Out of Food in the Last Year: Never true  Transportation Needs: No Transportation Needs (  12/11/2021)   PRAPARE - Administrator, Civil Service (Medical): No    Lack of Transportation (Non-Medical): No  Physical Activity: Not on file  Stress: Not on file  Social Connections: Not on file  Intimate Partner Violence: Not on file   Family Status  Relation Name Status   Mother  Alive   MGM  Alive   MGF  Alive   PGM  Deceased   PGF  Deceased   Father  Alive   Family History  Problem Relation Age of Onset   Lymphoma Mother    Breast cancer Maternal Grandmother    Diabetes Maternal Grandmother    Diabetes Maternal Grandfather    Cancer Maternal Grandfather        Unsure if Prostate or colon   Diabetes Paternal Grandmother    Diabetes Paternal Grandfather    Obesity Father    Allergies  Allergen Reactions   Avocado Other (See Comments)    Patient Care Team: Pixie Burgener, Bonna Gains, NP as PCP - General (Internal Medicine) Arlana Lindau, NP as Nurse Practitioner (Obstetrics and Gynecology) Illene Labrador, OD (Optometry) Obgyn, [facility]   Medications: Outpatient  Medications Prior to Visit  Medication Sig   acetaminophen (TYLENOL) 650 MG CR tablet Take 650 mg by mouth every 8 (eight) hours as needed for pain.   acetic acid-hydrocortisone (VOSOL-HC) OTIC solution Place 3 drops into both ears 3 (three) times daily.   ALPRAZolam (XANAX) 0.5 MG tablet Take 0.5 mg by mouth 2 (two) times daily as needed for anxiety.   Aspirin-Acetaminophen-Caffeine (EXCEDRIN PO) Take by mouth.   ergocalciferol (VITAMIN D2) 1.25 MG (50000 UT) capsule Take 50,000 Units by mouth once a week.   ibuprofen (ADVIL) 600 MG tablet Take 1 tablet (600 mg total) by mouth every 6 (six) hours as needed.   Multiple Vitamin (MULTIVITAMIN) tablet Take 1 tablet by mouth daily.   TURMERIC PO turmeric   valACYclovir (VALTREX) 1000 MG tablet Take 1 tablet (1,000 mg total) by mouth 2 (two) times daily. [name].Massing   [DISCONTINUED] methocarbamol (ROBAXIN) 500 MG tablet Take 1 tablet (500 mg total) by mouth 2 (two) times daily as needed for muscle spasms. Take 1-2 tablets (500mg  to 1000mg  total) by mouth two times daily. (Patient not taking: Reported on 12/30/2021)   No facility-administered medications prior to visit.    Review of Systems  Constitutional:  Negative for fever.  HENT:  Negative for congestion and sore throat.   Eyes:        Negative for visual changes  Respiratory:  Negative for cough and shortness of breath.   Cardiovascular:  Negative for chest pain, palpitations and leg swelling.  Gastrointestinal:  Negative for blood in stool, constipation and diarrhea.  Genitourinary:  Negative for dysuria, frequency and urgency.  Musculoskeletal:  Negative for myalgias.  Skin:  Negative for rash.  Neurological:  Negative for dizziness and headaches.  Hematological:  Does not bruise/bleed easily.  Psychiatric/Behavioral:  Positive for dysphoric mood. Negative for suicidal ideas. The patient is nervous/anxious.         Objective:  BP 126/76 (BP Location: Right Arm, Patient Position:  Sitting, Cuff Size: Normal)   Pulse 72   Temp (!) 96.4 F (35.8 C) (Temporal)   Ht 5\' 5"  (1.651 m)   Wt 236 lb 3.2 oz (107.1 kg)   LMP 12/19/2021   SpO2 97%   BMI 39.31 kg/m     Physical Exam Vitals and nursing note reviewed.  Constitutional:  General: She is not in acute distress.    Appearance: She is well-developed. She is obese.  HENT:     Right Ear: Tympanic membrane, ear canal and external ear normal.     Left Ear: Tympanic membrane, ear canal and external ear normal.     Nose: Nose normal.  Eyes:     Extraocular Movements: Extraocular movements intact.     Conjunctiva/sclera: Conjunctivae normal.     Pupils: Pupils are equal, round, and reactive to light.  Cardiovascular:     Rate and Rhythm: Normal rate and regular rhythm.     Pulses: Normal pulses.     Heart sounds: Normal heart sounds.  Pulmonary:     Effort: Pulmonary effort is normal. No respiratory distress.     Breath sounds: Normal breath sounds.  Chest:     Chest wall: No tenderness.  Abdominal:     General: Bowel sounds are normal.     Palpations: Abdomen is soft.  Genitourinary:    Comments: Deferred breast and pelvic exam to GYN Musculoskeletal:        General: Normal range of motion.     Cervical back: Normal range of motion and neck supple.     Right lower leg: No edema.     Left lower leg: No edema.  Lymphadenopathy:     Cervical: No cervical adenopathy.  Skin:    General: Skin is warm and dry.  Neurological:     Mental Status: She is alert and oriented to person, place, and time.     Deep Tendon Reflexes: Reflexes are normal and symmetric.  Psychiatric:        Behavior: Behavior normal.        Thought Content: Thought content normal.      Results for orders placed or performed in visit on 12/30/21  Hemoglobin A1c  Result Value Ref Range   Hgb A1c MFr Bld 6.1 4.6 - 6.5 %  Comprehensive metabolic panel  Result Value Ref Range   Sodium 141 135 - 145 mEq/L   Potassium 4.9 3.5 -  5.1 mEq/L   Chloride 106 96 - 112 mEq/L   CO2 29 19 - 32 mEq/L   Glucose, Bld 107 (H) 70 - 99 mg/dL   BUN 10 6 - 23 mg/dL   Creatinine, Ser 1.610.83 0.40 - 1.20 mg/dL   Total Bilirubin 0.4 0.2 - 1.2 mg/dL   Alkaline Phosphatase 36 (L) 39 - 117 U/L   AST 14 0 - 37 U/L   ALT 18 0 - 35 U/L   Total Protein 6.8 6.0 - 8.3 g/dL   Albumin 4.3 3.5 - 5.2 g/dL   GFR 09.6085.91 >45.40>60.00 mL/min   Calcium 9.0 8.4 - 10.5 mg/dL  Lipid panel  Result Value Ref Range   Cholesterol 203 (H) 0 - 200 mg/dL   Triglycerides 981.1140.0 0.0 - 149.0 mg/dL   HDL 91.4747.50 >82.95>39.00 mg/dL   VLDL 62.128.0 0.0 - 30.840.0 mg/dL   LDL Cholesterol 657128 (H) 0 - 99 mg/dL   Total CHOL/HDL Ratio 4    NonHDL 155.80   CBC  Result Value Ref Range   WBC 5.9 4.0 - 10.5 K/uL   RBC 4.85 3.87 - 5.11 Mil/uL   Platelets 179.0 150.0 - 400.0 K/uL   Hemoglobin 13.4 12.0 - 15.0 g/dL   HCT 84.640.5 96.236.0 - 95.246.0 %   MCV 83.5 78.0 - 100.0 fl   MCHC 33.1 30.0 - 36.0 g/dL   RDW 84.114.1 32.411.5 - 40.115.5 %  Vitamin D (25 hydroxy)  Result Value Ref Range   VITD 45.97 30.00 - 100.00 ng/mL      Assessment & Plan:    Routine Health Maintenance and Physical Exam  Immunization History  Administered Date(s) Administered   Influenza Inj Mdck Quad Pf 11/10/2017   Influenza,inj,Quad PF,6+ Mos 11/26/2015, 11/26/2016, 10/20/2018, 12/19/2019, 11/27/2020   PFIZER(Purple Top)SARS-COV-2 Vaccination 04/30/2019, 05/21/2019   Tdap 10/06/2010    Health Maintenance  Topic Date Due   COVID-19 Vaccine (3 - 2023-24 season) 01/01/2022 (Originally 10/03/2021)   INFLUENZA VACCINE  05/03/2022 (Originally 09/02/2021)   Hepatitis C Screening  12/31/2022 (Originally 11/13/1995)   PAP SMEAR-Modifier  12/01/2024   HIV Screening  Completed   HPV VACCINES  Aged Out   Discussed health benefits of physical activity, and encouraged her to engage in regular exercise appropriate for her age and condition. Stable cbc, vit. D, renal and liver function HgbA1c at 6.1%: prediabetes Abnormal lipid panel:  elevated TC and LDL. It is important to maintain heart healthy diet and resume daily exercise F/upp in 49months for repeat hgbA1c and lipid panel (fasting)  Problem List Items Addressed This Visit       Other   Elevated LDL cholesterol level   Relevant Orders   Lipid panel (Completed)   Vitamin D deficiency   Relevant Orders   Vitamin D (25 hydroxy) (Completed)   Other Visit Diagnoses     Encounter for preventative adult health care exam with abnormal findings    -  Primary   Relevant Orders   Comprehensive metabolic panel (Completed)   CBC (Completed)   Hyperglycemia       Relevant Orders   Hemoglobin A1c (Completed)      Return for CPE (fasting).     Alysia Penna, NP

## 2021-12-30 NOTE — Patient Instructions (Signed)
Go to lab Resume daily exercise: start with low impact exercise, stretch before and after exercise.  Preventive Care 14-44 Years Old, Female Preventive care refers to lifestyle choices and visits with your health care provider that can promote health and wellness. Preventive care visits are also called wellness exams. What can I expect for my preventive care visit? Counseling Your health care provider may ask you questions about your: Medical history, including: Past medical problems. Family medical history. Pregnancy history. Current health, including: Menstrual cycle. Method of birth control. Emotional well-being. Home life and relationship well-being. Sexual activity and sexual health. Lifestyle, including: Alcohol, nicotine or tobacco, and drug use. Access to firearms. Diet, exercise, and sleep habits. Work and work Statistician. Sunscreen use. Safety issues such as seatbelt and bike helmet use. Physical exam Your health care provider will check your: Height and weight. These may be used to calculate your BMI (body mass index). BMI is a measurement that tells if you are at a healthy weight. Waist circumference. This measures the distance around your waistline. This measurement also tells if you are at a healthy weight and may help predict your risk of certain diseases, such as type 2 diabetes and high blood pressure. Heart rate and blood pressure. Body temperature. Skin for abnormal spots. What immunizations do I need?  Vaccines are usually given at various ages, according to a schedule. Your health care provider will recommend vaccines for you based on your age, medical history, and lifestyle or other factors, such as travel or where you work. What tests do I need? Screening Your health care provider may recommend screening tests for certain conditions. This may include: Lipid and cholesterol levels. Diabetes screening. This is done by checking your blood sugar (glucose)  after you have not eaten for a while (fasting). Pelvic exam and Pap test. Hepatitis B test. Hepatitis C test. HIV (human immunodeficiency virus) test. STI (sexually transmitted infection) testing, if you are at risk. Lung cancer screening. Colorectal cancer screening. Mammogram. Talk with your health care provider about when you should start having regular mammograms. This may depend on whether you have a family history of breast cancer. BRCA-related cancer screening. This may be done if you have a family history of breast, ovarian, tubal, or peritoneal cancers. Bone density scan. This is done to screen for osteoporosis. Talk with your health care provider about your test results, treatment options, and if necessary, the need for more tests. Follow these instructions at home: Eating and drinking  Eat a diet that includes fresh fruits and vegetables, whole grains, lean protein, and low-fat dairy products. Take vitamin and mineral supplements as recommended by your health care provider. Do not drink alcohol if: Your health care provider tells you not to drink. You are pregnant, may be pregnant, or are planning to become pregnant. If you drink alcohol: Limit how much you have to 0-1 drink a day. Know how much alcohol is in your drink. In the [place] one drink equals one 12 oz bottle of beer (355 mL), one 5 oz glass of wine (148 mL), or one 1 oz glass of hard liquor (44 mL). Lifestyle Brush your teeth every morning and night with fluoride toothpaste. Floss one time each day. Exercise for at least 30 minutes 5 or more days each week. Do not use any products that contain nicotine or tobacco. These products include cigarettes, chewing tobacco, and vaping devices, such as e-cigarettes. If you need help quitting, ask your health care provider. Do not use drugs.  If you are sexually active, practice safe sex. Use a condom or other form of protection to prevent STIs. If you do not wish to become  pregnant, use a form of birth control. If you plan to become pregnant, see your health care provider for a prepregnancy visit. Take aspirin only as told by your health care provider. Make sure that you understand how much to take and what form to take. Work with your health care provider to find out whether it is safe and beneficial for you to take aspirin daily. Find healthy ways to manage stress, such as: Meditation, yoga, or listening to music. Journaling. Talking to a trusted person. Spending time with friends and family. Minimize exposure to UV radiation to reduce your risk of skin cancer. Safety Always wear your seat belt while driving or riding in a vehicle. Do not drive: If you have been drinking alcohol. Do not ride with someone who has been drinking. When you are tired or distracted. While texting. If you have been using any mind-altering substances or drugs. Wear a helmet and other protective equipment during sports activities. If you have firearms in your house, make sure you follow all gun safety procedures. Seek help if you have been physically or sexually abused. What's next? Visit your health care provider once a year for an annual wellness visit. Ask your health care provider how often you should have your eyes and teeth checked. Stay up to date on all vaccines. This information is not intended to replace advice given to you by your health care provider. Make sure you discuss any questions you have with your health care provider. Document Revised: 07/17/2020 Document Reviewed: 07/17/2020 Elsevier Patient Education  [place].

## 2021-12-31 ENCOUNTER — Encounter (INDEPENDENT_AMBULATORY_CARE_PROVIDER_SITE_OTHER): Payer: Self-pay

## 2022-01-07 DIAGNOSIS — R7303 Prediabetes: Secondary | ICD-10-CM | POA: Diagnosis not present

## 2022-01-16 DIAGNOSIS — M25511 Pain in right shoulder: Secondary | ICD-10-CM | POA: Diagnosis not present

## 2022-01-20 ENCOUNTER — Ambulatory Visit: Payer: BC Managed Care – PPO

## 2022-01-21 ENCOUNTER — Ambulatory Visit: Payer: BC Managed Care – PPO

## 2022-01-29 ENCOUNTER — Ambulatory Visit: Payer: BC Managed Care – PPO

## 2022-02-06 DIAGNOSIS — M25551 Pain in right hip: Secondary | ICD-10-CM | POA: Diagnosis not present

## 2022-02-27 ENCOUNTER — Telehealth: Payer: Self-pay | Admitting: Nurse Practitioner

## 2022-02-27 NOTE — Telephone Encounter (Signed)
Forms received, please allow 7-10 business days to complete  

## 2022-02-27 NOTE — Telephone Encounter (Signed)
Type of forms received: annual cpe form  Routed XY:VOPFY  Paperwork received by : terrill   Individual made aware of 3-5 business day turn around (Y/N):yes  Form completed and patient made aware of charges(Y/N): yes  Faxed to :   Form location:  nche box

## 2022-03-04 ENCOUNTER — Telehealth: Payer: Self-pay | Admitting: Nurse Practitioner

## 2022-03-04 NOTE — Telephone Encounter (Signed)
error 

## 2022-03-14 ENCOUNTER — Encounter: Payer: Self-pay | Admitting: Nurse Practitioner

## 2022-03-31 ENCOUNTER — Ambulatory Visit: Payer: BC Managed Care – PPO | Admitting: Nurse Practitioner

## 2022-04-20 ENCOUNTER — Ambulatory Visit: Payer: BC Managed Care – PPO | Admitting: Nurse Practitioner

## 2022-04-20 ENCOUNTER — Encounter: Payer: Self-pay | Admitting: Nurse Practitioner

## 2022-04-20 VITALS — BP 104/76 | HR 77 | Temp 97.6°F | Resp 16 | Ht 65.0 in | Wt 234.0 lb

## 2022-04-20 DIAGNOSIS — F339 Major depressive disorder, recurrent, unspecified: Secondary | ICD-10-CM | POA: Insufficient documentation

## 2022-04-20 DIAGNOSIS — Z6838 Body mass index (BMI) 38.0-38.9, adult: Secondary | ICD-10-CM | POA: Diagnosis not present

## 2022-04-20 DIAGNOSIS — F331 Major depressive disorder, recurrent, moderate: Secondary | ICD-10-CM | POA: Diagnosis not present

## 2022-04-20 DIAGNOSIS — H9312 Tinnitus, left ear: Secondary | ICD-10-CM | POA: Diagnosis not present

## 2022-04-20 DIAGNOSIS — H608X3 Other otitis externa, bilateral: Secondary | ICD-10-CM | POA: Diagnosis not present

## 2022-04-20 MED ORDER — BUPROPION HCL 75 MG PO TABS: ORAL_TABLET | ORAL | 5 refills | Status: DC

## 2022-04-20 NOTE — Patient Instructions (Signed)
Psychologytoday.com or therapyforblackgirls.com Use above website for psychology appt. Start wellbutrin as discussed Maintain heart healthy and daily exercise.  How to Increase Your Level of Physical Activity Getting regular physical activity is important for your overall health and well-being. Most people do not get enough exercise. There are easy ways to increase your level of physical activity, even if you have not been very active in the past or if you are just starting out. What are the benefits of physical activity? Physical activity has many short-term and long-term benefits. Being active on a regular basis can improve your physical and mental health as well as provide other benefits. Physical health benefits Helping you lose weight or maintain a healthy weight. Strengthening your muscles and bones. Reducing your risk of certain long-term (chronic) diseases, including heart disease, cancer, and diabetes. Being able to move around more easily and for longer periods of time without getting tired (increased endurance or stamina). Improving your ability to fight off illness (enhanced immunity). Being able to sleep better. Helping you stay healthy as you get older, including: Helping you stay mobile, or capable of walking and moving around. Preventing accidents, such as falls. Increasing life expectancy. Mental health benefits Boosting your mood and improving your self-esteem. Lowering your chance of having mental health problems, such as depression or anxiety. Helping you feel good about your body. Other benefits Finding new sources of fun and enjoyment. Meeting new people who share a common interest. Before you begin If you have a chronic illness or have not been active for a while, check with your health care provider about how to get started. Ask your health care provider what activities are safe for you. Start out slowly. Walking or doing some simple chair exercises is a good  place to start, especially if you have not been active before or for a long time. Set goals that you can work toward. Ask your health care provider how much exercise is best for you. In general, most adults should: Do moderate-intensity exercise for at least 150 minutes each week (30 minutes on most days of the week) or vigorous exercise for at least 75 minutes each week, or a combination of these. Moderate-intensity exercise can include walking at a quick pace, biking, yoga, water aerobics, or gardening. Vigorous exercise involves activities that take more effort, such as jogging or running, playing sports, swimming laps, or jumping rope. Do strength exercises on at least 2 days each week. This can include weight lifting, body weight exercises, and resistance-band exercises. How to be more physically active Make a plan  Try to find activities that you enjoy. You are more likely to commit to an exercise routine if it does not feel like a chore. If you have bone or joint problems, choose low-impact exercises, like walking or swimming. Use these tips for being successful with an exercise plan: Find a workout partner for accountability. Join a group or class, such as an aerobics class, cycling class, or sports team. Make family time active. Go for a walk, bike, or swim. Include a variety of exercises each week. Consider using a fitness tracker, such as a mobile phone app or a device worn like a watch, that will count the number of steps you take each day. Many people strive to reach 10,000 steps a day. Find ways to be active in your daily routines Besides your formal exercise plans, you can find ways to do physical activity during your daily routines, such as: Walking or biking to  work or to [employer]. Taking the stairs instead of the elevator. Parking farther away from the door at work or at the store. Planning walking meetings. Walking around while you are on the phone. Where to find more  information Centers for Disease Control and Prevention: WorkDashboard.es President's Council on Fitness, Sports & Nutrition: www.fitness.gov ChooseMyPlate: MassVoice.es Contact a health care provider if: You have headaches, muscle aches, or joint pain that is concerning. You feel dizzy or light-headed while exercising. You faint. You feel your heart skipping, racing, or fluttering. You have chest pain while exercising. Summary Exercise benefits your mind and body at any age, even if you are just starting out. If you have a chronic illness or have not been active for a while, check with your health care provider before increasing your physical activity. Choose activities that are safe and enjoyable for you. Ask your health care provider what activities are safe for you. Start slowly. Tell your health care provider if you have problems as you start to increase your activity level. This information is not intended to replace advice given to you by your health care provider. Make sure you discuss any questions you have with your health care provider. Document Revised: 05/17/2020 Document Reviewed: 05/17/2020 Elsevier Patient Education  [address].

## 2022-04-20 NOTE — Assessment & Plan Note (Signed)
Chronic, waxing and waxing,no SI/HI/hallucination, she has occasional thoughts of death and self worth. No improving with ashwaghanda x 22months Previous CBT sessions, no improvement Previous use of xanax prn. No known Fhx of mood disorder or suicide She has Decrease ETOH intake to 2-3x/week-1-2drinks each time, beer liquor wine.  She agreed to start wellbutrin 75mg  BID Provided information to schedule appt with therapist F/up in 1-44months

## 2022-04-20 NOTE — Assessment & Plan Note (Signed)
Continues to struggle with emotional eating, lack of motivation to maintain heart healthy diet and exercise regimen. She admits to depressive symptoms. Reports she is unable to afford appts with healthy weight clinic due to copays. She has [employer] membership but does not use it. Wt Readings from Last 3 Encounters:  04/20/22 234 lb (106.1 kg)  12/30/21 236 lb 3.2 oz (107.1 kg)  12/16/21 237 lb (107.5 kg)

## 2022-04-20 NOTE — Progress Notes (Signed)
Established Patient Visit  Patient: Kathy [name]   DOB: [birth year]   47 y.o. Female  MRN: [id number] Visit Date: 04/20/2022  Subjective:    Chief Complaint  Patient presents with   Medical Management of Chronic Issues    Fasting-yes    HPI Class 2 severe obesity due to excess calories with serious comorbidity and body mass index (BMI) of 38.0 to 38.9 in adult [facility]) Continues to struggle with emotional eating, lack of motivation to maintain heart healthy diet and exercise regimen. She admits to depressive symptoms. Reports she is unable to afford appts with healthy weight clinic due to copays. She has [employer] membership but does not use it. Wt Readings from Last 3 Encounters:  04/20/22 234 lb (106.1 kg)  12/30/21 236 lb 3.2 oz (107.1 kg)  12/16/21 237 lb (107.5 kg)     Moderate episode of recurrent major depressive disorder (HCC) Chronic, waxing and waxing,no SI/HI/hallucination, she has occasional thoughts of death and self worth. No improving with ashwaghanda x 71months Previous CBT sessions, no improvement Previous use of xanax prn. No known Fhx of mood disorder or suicide She has Decrease ETOH intake to 2-3x/week-1-2drinks each time, beer liquor wine.  She agreed to start wellbutrin 75mg  BID Provided information to schedule appt with therapist F/up in 1-96months    BP Readings from Last 3 Encounters:  04/20/22 104/76  12/30/21 126/76  12/16/21 122/64    Reviewed medical, surgical, and social history today  Medications: Outpatient Medications Prior to Visit  Medication Sig   ALPRAZolam (XANAX) 0.5 MG tablet Take 0.5 mg by mouth 2 (two) times daily as needed for anxiety.   ASHWAGANDHA 35 PO Take by mouth.   Aspirin-Acetaminophen-Caffeine (EXCEDRIN PO) Take by mouth.   cetirizine (ZYRTEC) 10 MG tablet Take 10 mg by mouth daily.   ergocalciferol (VITAMIN D2) 1.25 MG (50000 UT) capsule Take 50,000 Units by mouth once a week.   ibuprofen (ADVIL) 600 MG  tablet Take 1 tablet (600 mg total) by mouth every 6 (six) hours as needed.   Multiple Vitamin (MULTIVITAMIN) tablet Take 1 tablet by mouth daily.   TURMERIC PO turmeric   valACYclovir (VALTREX) 1000 MG tablet Take 1 tablet (1,000 mg total) by mouth 2 (two) times daily. X3-5days   acetaminophen (TYLENOL) 650 MG CR tablet Take 650 mg by mouth every 8 (eight) hours as needed for pain.   acetic acid-hydrocortisone (VOSOL-HC) OTIC solution Place 3 drops into both ears 3 (three) times daily. (Patient not taking: Reported on 04/20/2022)   No facility-administered medications prior to visit.   Reviewed past medical and social history.   ROS per HPI above      Objective:  BP 104/76 (BP Location: Left Arm, Patient Position: Sitting, Cuff Size: Large)   Pulse 77   Temp 97.6 F (36.4 C) (Temporal)   Resp 16   Ht 5\' 5"  (1.651 m)   Wt 234 lb (106.1 kg)   LMP 04/18/2022 (Exact Date)   SpO2 97%   BMI 38.94 kg/m      Physical Exam  No results found for any visits on 04/20/22.    Assessment & Plan:    Problem List Items Addressed This Visit       Other   Class 2 severe obesity due to excess calories with serious comorbidity and body mass index (BMI) of 38.0 to 38.9 in adult [facility]) - Primary    Continues  to struggle with emotional eating, lack of motivation to maintain heart healthy diet and exercise regimen. She admits to depressive symptoms. Reports she is unable to afford appts with healthy weight clinic due to copays. She has [employer] membership but does not use it. Wt Readings from Last 3 Encounters:  04/20/22 234 lb (106.1 kg)  12/30/21 236 lb 3.2 oz (107.1 kg)  12/16/21 237 lb (107.5 kg)         Moderate episode of recurrent major depressive disorder (HCC)    Chronic, waxing and waxing,no SI/HI/hallucination, she has occasional thoughts of death and self worth. No improving with ashwaghanda x 55months Previous CBT sessions, no improvement Previous use of xanax prn. No known Fhx  of mood disorder or suicide She has Decrease ETOH intake to 2-3x/week-1-2drinks each time, beer liquor wine.  She agreed to start wellbutrin 75mg  BID Provided information to schedule appt with therapist F/up in 1-90months      Relevant Medications   buPROPion (WELLBUTRIN) 75 MG tablet   Return in about 2 months (around 06/20/2022) for depression and anxiety, Weight management, hyperlipidemia (fasting).     Wilfred Lacy, NP

## 2022-07-03 ENCOUNTER — Telehealth: Payer: Self-pay

## 2022-07-03 ENCOUNTER — Encounter: Payer: Self-pay | Admitting: Nurse Practitioner

## 2022-07-03 ENCOUNTER — Ambulatory Visit: Payer: BC Managed Care – PPO | Admitting: Nurse Practitioner

## 2022-07-03 VITALS — BP 110/83 | HR 74 | Temp 98.4°F | Resp 16 | Ht 65.0 in | Wt 238.2 lb

## 2022-07-03 DIAGNOSIS — R739 Hyperglycemia, unspecified: Secondary | ICD-10-CM | POA: Diagnosis not present

## 2022-07-03 DIAGNOSIS — Z6839 Body mass index (BMI) 39.0-39.9, adult: Secondary | ICD-10-CM

## 2022-07-03 DIAGNOSIS — F3341 Major depressive disorder, recurrent, in partial remission: Secondary | ICD-10-CM

## 2022-07-03 DIAGNOSIS — E7800 Pure hypercholesterolemia, unspecified: Secondary | ICD-10-CM

## 2022-07-03 LAB — LIPID PANEL
Cholesterol: 193 mg/dL (ref 0–200)
HDL: 55.4 mg/dL (ref >39.00)
LDL Cholesterol: 110 mg/dL — ABNORMAL HIGH (ref 0–99)
NonHDL: 137.23
Total CHOL/HDL Ratio: 3
Triglycerides: 138 mg/dL (ref 0.0–149.0)
VLDL: 27.6 mg/dL (ref 0.0–40.0)

## 2022-07-03 LAB — HEMOGLOBIN A1C: Hgb A1c MFr Bld: 5.8 % (ref 4.6–6.5)

## 2022-07-03 NOTE — Patient Instructions (Signed)
Go to lab Maintain Heart healthy diet and daily exercise ( of moderate intensity per week)

## 2022-07-03 NOTE — Patient Outreach (Signed)
  Care Coordination   In Person Provider Office Visit Note   07/03/2022 Name: Kathy [name] MRN: [id number] DOB: 28-Apr-1975  Kathy [name] is a 47 y.o. year old female who sees Nche, Bonna Gains, NP for primary care. I engaged with [name] in the providers office today.  What matters to the patients health and wellness today?  none    Goals Addressed             This Visit's Progress    COMPLETED: Care Coordination Activities- No follow up required       Care Coordination Interventions: Advised patient to Annual Wellness exam. Discussed [facility] services and support. Assessed SDOH. Advised to discuss with primary care physician if services needed in the future.         SDOH assessments and interventions completed:  Yes  SDOH Interventions Today    Flowsheet Row Most Recent Value  SDOH Interventions   Housing Interventions Intervention Not Indicated  Transportation Interventions Intervention Not Indicated        Care Coordination Interventions:  Yes, provided   Follow up plan: No further intervention required.   Encounter Outcome:  Pt. Visit Completed   Bary Leriche, RN, MSN [facility] Care Management Care Management Coordinator Direct Line [phone]

## 2022-07-03 NOTE — Assessment & Plan Note (Signed)
Repeat hgbA1c 

## 2022-07-03 NOTE — Progress Notes (Signed)
Established Patient Visit  Patient: Kathy [name]   DOB: [birth year]   47 y.o. Female  MRN: [id number] Visit Date: 07/03/2022  Subjective:    Chief Complaint  Patient presents with   Medical Management of Chronic Issues    Fasting- yes    Depression   HPI Elevated LDL cholesterol level Repeat lipid panel  Hyperglycemia Repeat hgbA1c  Moderate episode of recurrent major depressive disorder (HCC) Stable mood She decided not to take wellbutrin. Advised to schedule appt with therapist  Obesity Continues to struggle with diet modification and exercise. She does acknowledge possible complications from obesity. She does not want to start any medication at this time due to possible side effects. Took phentermine in past. This caused palpitations. We discussed ways to incorporate heart healthy diet and daily exercise. Wt Readings from Last 3 Encounters:  07/03/22 238 lb 3.2 oz (108 kg)  04/20/22 234 lb (106.1 kg)  12/30/21 236 lb 3.2 oz (107.1 kg)      Reviewed medical, surgical, and social history today  Medications: Outpatient Medications Prior to Visit  Medication Sig   acetaminophen (TYLENOL) 650 MG CR tablet Take 650 mg by mouth every 8 (eight) hours as needed for pain.   acetic acid-hydrocortisone (VOSOL-HC) OTIC solution Place 3 drops into both ears 3 (three) times daily.   ALPRAZolam (XANAX) 0.5 MG tablet Take 0.5 mg by mouth 2 (two) times daily as needed for anxiety.   ASHWAGANDHA 35 PO Take by mouth.   Aspirin-Acetaminophen-Caffeine (EXCEDRIN PO) Take by mouth.   buPROPion (WELLBUTRIN) 75 MG tablet 1tab daily x 1week, then 1tab BID with food   cetirizine (ZYRTEC) 10 MG tablet Take 10 mg by mouth daily.   ergocalciferol (VITAMIN D2) 1.25 MG (50000 UT) capsule Take 50,000 Units by mouth once a week.   ibuprofen (ADVIL) 600 MG tablet Take 1 tablet (600 mg total) by mouth every 6 (six) hours as needed.   Multiple Vitamin (MULTIVITAMIN) tablet Take 1  tablet by mouth daily.   TURMERIC PO turmeric   valACYclovir (VALTREX) 1000 MG tablet Take 1 tablet (1,000 mg total) by mouth 2 (two) times daily. X3-5days   No facility-administered medications prior to visit.   Reviewed past medical and social history.   ROS per HPI above      Objective:  BP 110/83 (BP Location: Left Arm, Patient Position: Sitting, Cuff Size: Large)   Pulse 74   Temp 98.4 F (36.9 C) (Temporal)   Resp 16   Ht 5\' 5"  (1.651 m)   Wt 238 lb 3.2 oz (108 kg)   LMP 06/14/2022 (Approximate)   SpO2 97%   BMI 39.64 kg/m      Physical Exam Cardiovascular:     Rate and Rhythm: Normal rate and regular rhythm.     Pulses: Normal pulses.     Heart sounds: Normal heart sounds.  Pulmonary:     Effort: Pulmonary effort is normal.     Breath sounds: Normal breath sounds.  Neurological:     Mental Status: She is alert and oriented to person, place, and time.  Psychiatric:        Attention and Perception: Attention normal.        Mood and Affect: Mood normal.        Speech: Speech normal.        Behavior: Behavior normal.     No results found for any visits on  07/03/22.    Assessment & Plan:    Problem List Items Addressed This Visit       Other   Elevated LDL cholesterol level    Repeat lipid panel      Relevant Orders   Lipid panel   Hyperglycemia - Primary    Repeat hgbA1c      Relevant Orders   Hemoglobin A1c   Moderate episode of recurrent major depressive disorder (HCC)    Stable mood She decided not to take wellbutrin. Advised to schedule appt with therapist      Obesity    Continues to struggle with diet modification and exercise. She does acknowledge possible complications from obesity. She does not want to start any medication at this time due to possible side effects. Took phentermine in past. This caused palpitations. We discussed ways to incorporate heart healthy diet and daily exercise. Wt Readings from Last 3 Encounters:  07/03/22  238 lb 3.2 oz (108 kg)  04/20/22 234 lb (106.1 kg)  12/30/21 236 lb 3.2 oz (107.1 kg)         Return in about 3 months (around 10/03/2022) for prediabetes, Weight management, hyperlipidemia (fasting).     Alysia Penna, NP

## 2022-07-03 NOTE — Assessment & Plan Note (Addendum)
Continues to struggle with diet modification and exercise. She does acknowledge possible complications from obesity. She does not want to start any medication at this time due to possible side effects. Took phentermine in past. This caused palpitations. We discussed ways to incorporate heart healthy diet and daily exercise. Wt Readings from Last 3 Encounters:  07/03/22 238 lb 3.2 oz (108 kg)  04/20/22 234 lb (106.1 kg)  12/30/21 236 lb 3.2 oz (107.1 kg)

## 2022-07-03 NOTE — Assessment & Plan Note (Signed)
Stable mood She decided not to take wellbutrin. Advised to schedule appt with therapist

## 2022-07-03 NOTE — Assessment & Plan Note (Signed)
Repeat lipid panel ?

## 2022-07-03 NOTE — Patient Instructions (Signed)
Visit Information  Thank you for taking time to visit with me today. Please don't hesitate to contact me if I can be of assistance to you.   Following are the goals we discussed today:   Goals Addressed             This Visit's Progress    COMPLETED: Care Coordination Activities-No follow up required       Care Coordination Interventions: Advised patient to Annual Wellness exam. Discussed [facility] services and support. Assessed SDOH. Advised to discuss with primary care physician if services needed in the future.         If you are experiencing a Mental Health or Behavioral Health Crisis or need someone to talk to, please call the Suicide and Crisis Lifeline: 988   Patient verbalizes understanding of instructions and care plan provided today and agrees to view in MyChart. Active MyChart status and patient understanding of how to access instructions and care plan via MyChart confirmed with patient.     The patient has been provided with contact information for the care management team and has been advised to call with any health related questions or concerns.   Dory Demont J Janaki Exley, RN, MSN [facility] Care Management Care Management Coordinator Direct Line [phone]     

## 2022-07-19 ENCOUNTER — Encounter: Payer: Self-pay | Admitting: Nurse Practitioner

## 2022-07-19 DIAGNOSIS — E7800 Pure hypercholesterolemia, unspecified: Secondary | ICD-10-CM

## 2022-07-19 DIAGNOSIS — R7301 Impaired fasting glucose: Secondary | ICD-10-CM

## 2022-07-20 MED ORDER — WEGOVY 0.25 MG/0.5ML ~~LOC~~ SOAJ: 0.2500 mg | SUBCUTANEOUS | 0 refills | Status: DC

## 2022-07-21 ENCOUNTER — Telehealth: Payer: Self-pay | Admitting: Pharmacy Technician

## 2022-07-21 ENCOUNTER — Other Ambulatory Visit (HOSPITAL_COMMUNITY): Payer: Self-pay

## 2022-07-21 NOTE — Telephone Encounter (Signed)
Pharmacy Patient Advocate Encounter  Received notification from [facility] that Prior Authorization for Reginal Lutes has been APPROVED from 07/20/22 to 02/19/23.Kathy [name] today at [facility].

## 2022-09-02 ENCOUNTER — Encounter (INDEPENDENT_AMBULATORY_CARE_PROVIDER_SITE_OTHER): Payer: Self-pay

## 2022-10-02 ENCOUNTER — Ambulatory Visit: Payer: BC Managed Care – PPO | Admitting: Nurse Practitioner

## 2022-10-02 ENCOUNTER — Encounter: Payer: Self-pay | Admitting: Nurse Practitioner

## 2022-10-02 ENCOUNTER — Telehealth: Payer: Self-pay

## 2022-10-02 VITALS — BP 110/80 | HR 63 | Temp 98.4°F | Resp 16 | Ht 65.0 in | Wt 244.8 lb

## 2022-10-02 DIAGNOSIS — R7301 Impaired fasting glucose: Secondary | ICD-10-CM

## 2022-10-02 DIAGNOSIS — F331 Major depressive disorder, recurrent, moderate: Secondary | ICD-10-CM

## 2022-10-02 DIAGNOSIS — Z23 Encounter for immunization: Secondary | ICD-10-CM

## 2022-10-02 DIAGNOSIS — Z6841 Body Mass Index (BMI) 40.0 and over, adult: Secondary | ICD-10-CM

## 2022-10-02 LAB — POCT GLYCOSYLATED HEMOGLOBIN (HGB A1C)
HbA1c POC (<> result, manual entry): 5.5 % (ref 4.0–5.6)
Hemoglobin A1C: 5.5 % (ref 4.0–5.6)

## 2022-10-02 NOTE — Assessment & Plan Note (Addendum)
Repeat hgbA1c: 5.5% normal

## 2022-10-02 NOTE — Patient Outreach (Signed)
  Care Coordination   In Person Provider Office Visit Note   10/02/2022 Name: Kathy [name] MRN: [id number] DOB: 21-Sep-1975  Kathy [name] is a 47 y.o. year old female who sees Kathy [name], Bonna Gains, NP for primary care. I engaged with Kathy [name] in the providers office today.  What matters to the patients health and wellness today?  Maintain health    Goals Addressed             This Visit's Progress    COMPLETED: Care Coordination Activities- No follow up required       Care Coordination Interventions: Discussed [facility] services and support. Assessed SDOH. Advised to discuss with primary care physician if services needed in the future.         SDOH assessments and interventions completed:  Yes  SDOH Interventions Today    Flowsheet Row Most Recent Value  SDOH Interventions   Food Insecurity Interventions Intervention Not Indicated        Care Coordination Interventions:  Yes, provided   Follow up plan: No further intervention required.   Encounter Outcome:  Pt. Visit Completed   [name] Idelle Jo, RN, MSN [facility]  [facility], [facility] Management Community Coordinator Direct Dial: [phone]  Fax: [phone] Website: [email or web address]

## 2022-10-02 NOTE — Assessment & Plan Note (Signed)
Declined to use wegovy due to fear of possible side effects. Advised to maintain heart healthy and daily exercise Wt Readings from Last 3 Encounters:  10/02/22 244 lb 12.8 oz (111 kg)  07/03/22 238 lb 3.2 oz (108 kg)  04/20/22 234 lb (106.1 kg)

## 2022-10-02 NOTE — Progress Notes (Signed)
Established Patient Visit  Patient: Kathy [name]   DOB: 19-Nov-1975   47 y.o. Female  MRN: [id number] Visit Date: 10/02/2022  Subjective:    Chief Complaint  Patient presents with   Medical Management of Chronic Issues    Tdap   HPI Moderate episode of recurrent major depressive disorder (HCC) Declined prescription med and appointment with counselor Opted to use ashwaghanda OTC  Hyperglycemia Repeat hgbA1c  Obesity Declined to use wegovy due to fear of possible side effects. Advised to maintain heart healthy and daily exercise Wt Readings from Last 3 Encounters:  10/02/22 244 lb 12.8 oz (111 kg)  07/03/22 238 lb 3.2 oz (108 kg)  04/20/22 234 lb (106.1 kg)     Wt Readings from Last 3 Encounters:  10/02/22 244 lb 12.8 oz (111 kg)  07/03/22 238 lb 3.2 oz (108 kg)  04/20/22 234 lb (106.1 kg)    Reviewed medical, surgical, and social history today  Medications: Outpatient Medications Prior to Visit  Medication Sig   acetaminophen (TYLENOL) 650 MG CR tablet Take 650 mg by mouth every 8 (eight) hours as needed for pain.   ASHWAGANDHA 35 PO Take by mouth.   Aspirin-Acetaminophen-Caffeine (EXCEDRIN PO) Take by mouth.   cetirizine (ZYRTEC) 10 MG tablet Take 10 mg by mouth daily.   ibuprofen (ADVIL) 600 MG tablet Take 1 tablet (600 mg total) by mouth every 6 (six) hours as needed.   Multiple Vitamin (MULTIVITAMIN) tablet Take 1 tablet by mouth daily.   TURMERIC PO turmeric   valACYclovir (VALTREX) 1000 MG tablet Take 1 tablet (1,000 mg total) by mouth 2 (two) times daily. [name].Massing   [DISCONTINUED] acetic acid-hydrocortisone (VOSOL-HC) OTIC solution Place 3 drops into both ears 3 (three) times daily.   [DISCONTINUED] ALPRAZolam (XANAX) 0.5 MG tablet Take 0.5 mg by mouth 2 (two) times daily as needed for anxiety.   [DISCONTINUED] buPROPion (WELLBUTRIN) 75 MG tablet 1tab daily x 1week, then 1tab BID with food   [DISCONTINUED] ergocalciferol (VITAMIN D2) 1.25 MG  (50000 UT) capsule Take 50,000 Units by mouth once a week.   [DISCONTINUED] Semaglutide-Weight Management (WEGOVY) 0.25 MG/0.5ML SOAJ Inject 0.25 mg into the skin once a week.   No facility-administered medications prior to visit.   Reviewed past medical and social history.   ROS per HPI above  Last metabolic panel Lab Results  Component Value Date   GLUCOSE 107 (H) 12/30/2021   NA 141 12/30/2021   K 4.9 12/30/2021   CL 106 12/30/2021   CO2 29 12/30/2021   BUN 10 12/30/2021   CREATININE 0.83 12/30/2021   GFR 85.91 12/30/2021   CALCIUM 9.0 12/30/2021   PROT 6.8 12/30/2021   ALBUMIN 4.3 12/30/2021   LABGLOB 2.3 07/05/2018   AGRATIO 1.9 07/05/2018   BILITOT 0.4 12/30/2021   ALKPHOS 36 (L) 12/30/2021   AST 14 12/30/2021   ALT 18 12/30/2021   Last lipids Lab Results  Component Value Date   CHOL 193 07/03/2022   HDL 55.40 07/03/2022   LDLCALC 110 (H) 07/03/2022   TRIG 138.0 07/03/2022   CHOLHDL 3 07/03/2022   Last hemoglobin A1c Lab Results  Component Value Date   HGBA1C 5.5 10/02/2022   HGBA1C 5.5 10/02/2022   Last thyroid functions Lab Results  Component Value Date   TSH 2.58 12/19/2020   T3TOTAL 103 07/05/2018   T4TOTAL 7.4 12/13/2018   Last vitamin D Lab Results  Component  Value Date   VD25OH 45.97 12/30/2021        Objective:  BP 110/80 (BP Location: Left Arm, Patient Position: Sitting, Cuff Size: Large)   Pulse 63   Temp 98.4 F (36.9 C) (Temporal)   Resp 16   Ht 5\' 5"  (1.651 m)   Wt 244 lb 12.8 oz (111 kg)   LMP 09/09/2022 (Exact Date)   SpO2 98%   BMI 40.74 kg/m      Physical Exam Cardiovascular:     Rate and Rhythm: Normal rate.     Pulses: Normal pulses.  Pulmonary:     Effort: Pulmonary effort is normal.  Musculoskeletal:     Right lower leg: No edema.     Left lower leg: No edema.  Neurological:     Mental Status: She is alert and oriented to person, place, and time.     No results found for any visits on 10/02/22.     Assessment & Plan:    Problem List Items Addressed This Visit     Hyperglycemia    Repeat hgbA1c      Relevant Orders   POCT glycosylated hemoglobin (Hb A1C)   Moderate episode of recurrent major depressive disorder (HCC)    Declined prescription med and appointment with counselor Opted to use ashwaghanda OTC      Obesity - Primary    Declined to use wegovy due to fear of possible side effects. Advised to maintain heart healthy and daily exercise Wt Readings from Last 3 Encounters:  10/02/22 244 lb 12.8 oz (111 kg)  07/03/22 238 lb 3.2 oz (108 kg)  04/20/22 234 lb (106.1 kg)         Other Visit Diagnoses     Encounter for immunization       Relevant Orders   Tdap vaccine greater than or equal to 7yo IM (Completed)      Return in about 3 months (around 12/31/2022) for CPE (fasting).     Alysia Penna, NP

## 2022-10-02 NOTE — Assessment & Plan Note (Signed)
Declined prescription med and appointment with counselor Opted to use ashwaghanda OTC

## 2022-10-02 NOTE — Patient Instructions (Signed)
 Visit Information  Thank you for taking time to visit with me today. Please don't hesitate to contact me if I can be of assistance to you.   Following are the goals we discussed today:   Goals Addressed             This Visit's Progress    COMPLETED: Care Coordination Activities-No follow up required       Care Coordination Interventions: Discussed [facility] services and support. Assessed SDOH. Advised to discuss with primary care physician if services needed in the future.          If you are experiencing a Mental Health or Behavioral Health Crisis or need someone to talk to, please call the Suicide and Crisis Lifeline: 988   Patient verbalizes understanding of instructions and care plan provided today and agrees to view in MyChart. Active MyChart status and patient understanding of how to access instructions and care plan via MyChart confirmed with patient.     The patient has been provided with contact information for the care management team and has been advised to call with any health related questions or concerns.   Bary Leriche, RN, MSN [facility], [facility] Management Community Coordinator Direct Dial: [phone]  Fax: [phone] Website: [email or web address]

## 2022-10-02 NOTE — Patient Instructions (Signed)
HgbA1c at 5.5%: normal.  How to Increase Your Level of Physical Activity Getting regular physical activity is important for your overall health and well-being. Most people do not get enough exercise. There are easy ways to increase your level of physical activity, even if you have not been very active in the past or if you are just starting out. What are the benefits of physical activity? Physical activity has many short-term and long-term benefits. Being active on a regular basis can improve your physical and mental health as well as provide other benefits. Physical health benefits Helping you lose weight or maintain a healthy weight. Strengthening your muscles and bones. Reducing your risk of certain long-term (chronic) diseases, including heart disease, cancer, and diabetes. Being able to move around more easily and for longer periods of time without getting tired (increased endurance or stamina). Improving your ability to fight off illness (enhanced immunity). Being able to sleep better. Helping you stay healthy as you get older, including: Helping you stay mobile, or capable of walking and moving around. Preventing accidents, such as falls. Increasing life expectancy. Mental health benefits Boosting your mood and improving your self-esteem. Lowering your chance of having mental health problems, such as depression or anxiety. Helping you feel good about your body. Other benefits Finding new sources of fun and enjoyment. Meeting new people who share a common interest. Before you begin If you have a chronic illness or have not been active for a while, check with your health care provider about how to get started. Ask your health care provider what activities are safe for you. Start out slowly. Walking or doing some simple chair exercises is a good place to start, especially if you have not been active before or for a long time. Set goals that you can work toward. Ask your health care  provider how much exercise is best for you. In general, most adults should: Do moderate-intensity exercise for at least 150 minutes each week (30 minutes on most days of the week) or vigorous exercise for at least 75 minutes each week, or a combination of these. Moderate-intensity exercise can include walking at a quick pace, biking, yoga, water aerobics, or gardening. Vigorous exercise involves activities that take more effort, such as jogging or running, playing sports, swimming laps, or jumping rope. Do strength exercises on at least 2 days each week. This can include weight lifting, body weight exercises, and resistance-band exercises. How to be more physically active Make a plan  Try to find activities that you enjoy. You are more likely to commit to an exercise routine if it does not feel like a chore. If you have bone or joint problems, choose low-impact exercises, like walking or swimming. Use these tips for being successful with an exercise plan: Find a workout partner for accountability. Join a group or class, such as an aerobics class, cycling class, or sports team. Make family time active. Go for a walk, bike, or swim. Include a variety of exercises each week. Consider using a fitness tracker, such as a mobile phone app or a device worn like a watch, that will count the number of steps you take each day. Many people strive to reach 10,000 steps a day. Find ways to be active in your daily routines Besides your formal exercise plans, you can find ways to do physical activity during your daily routines, such as: Walking or biking to work or to the store. Taking the stairs instead of the elevator. Parking farther away  from the door at work or at the store. Planning walking meetings. Walking around while you are on the phone. Where to find more information Centers for Disease Control and Prevention: CampusCasting.com.pt President's Council on Fitness, Sports & Nutrition:  www.fitness.gov ChooseMyPlate: http://www.harvey.com/ Contact a health care provider if: You have headaches, muscle aches, or joint pain that is concerning. You feel dizzy or light-headed while exercising. You faint. You feel your heart skipping, racing, or fluttering. You have chest pain while exercising. Summary Exercise benefits your mind and body at any age, even if you are just starting out. If you have a chronic illness or have not been active for a while, check with your health care provider before increasing your physical activity. Choose activities that are safe and enjoyable for you. Ask your health care provider what activities are safe for you. Start slowly. Tell your health care provider if you have problems as you start to increase your activity level. This information is not intended to replace advice given to you by your health care provider. Make sure you discuss any questions you have with your health care provider. Document Revised: 05/17/2020 Document Reviewed: 05/17/2020 Elsevier Patient Education  2024 Elsevier Inc.  Calorie Counting for [employer] Calories are units of energy. Your body needs a certain number of calories from food to keep going throughout the day. When you eat or drink more calories than your body needs, your body stores the extra calories mostly as fat. When you eat or drink fewer calories than your body needs, your body burns fat to get the energy it needs. Calorie counting means keeping track of how many calories you eat and drink each day. Calorie counting can be helpful if you need to lose weight. If you eat fewer calories than your body needs, you should lose weight. Ask your health care provider what a healthy weight is for you. For calorie counting to work, you will need to eat the right number of calories each day to lose a healthy amount of weight per week. A dietitian can help you figure out how many calories you need in a day and will suggest ways to  reach your calorie goal. A healthy amount of weight to lose each week is usually 1-2 lb (0.5-0.9 kg). This usually means that your daily calorie intake should be reduced by 500-750 calories. Eating 1,200-1,500 calories a day can help most women lose weight. Eating 1,500-1,800 calories a day can help most men lose weight. What do I need to know about calorie counting? Work with your health care provider or dietitian to determine how many calories you should get each day. To meet your daily calorie goal, you will need to: Find out how many calories are in each food that you would like to eat. Try to do this before you eat. Decide how much of the food you plan to eat. Keep a food log. Do this by writing down what you ate and how many calories it had. To successfully lose weight, it is important to balance calorie counting with a healthy lifestyle that includes regular activity. Where do I find calorie information?  The number of calories in a food can be found on a Nutrition Facts label. If a food does not have a Nutrition Facts label, try to look up the calories online or ask your dietitian for help. Remember that calories are listed per serving. If you choose to have more than one serving of a food, you will have  to multiply the calories per serving by the number of servings you plan to eat. For example, the label on a package of bread might say that a serving size is 1 slice and that there are 90 calories in a serving. If you eat 1 slice, you will have eaten 90 calories. If you eat 2 slices, you will have eaten 180 calories. How do I keep a food log? After each time that you eat, record the following in your food log as soon as possible: What you ate. Be sure to include toppings, sauces, and other extras on the food. How much you ate. This can be measured in cups, ounces, or number of items. How many calories were in each food and drink. The total number of calories in the food you ate. Keep your  food log near you, such as in a pocket-sized notebook or on an app or website on your mobile phone. Some programs will calculate calories for you and show you how many calories you have left to meet your daily goal. What are some portion-control tips? Know how many calories are in a serving. This will help you know how many servings you can have of a certain food. Use a measuring cup to measure serving sizes. You could also try weighing out portions on a kitchen scale. With time, you will be able to estimate serving sizes for some foods. Take time to put servings of different foods on your favorite plates or in your favorite bowls and cups so you know what a serving looks like. Try not to eat straight from a food's packaging, such as from a bag or box. Eating straight from the package makes it hard to see how much you are eating and can lead to overeating. Put the amount you would like to eat in a cup or on a plate to make sure you are eating the right portion. Use smaller plates, glasses, and bowls for smaller portions and to prevent overeating. Try not to multitask. For example, avoid watching TV or using your computer while eating. If it is time to eat, sit down at a table and enjoy your food. This will help you recognize when you are full. It will also help you be more mindful of what and how much you are eating. What are tips for following this plan? Reading food labels Check the calorie count compared with the serving size. The serving size may be smaller than what you are used to eating. Check the source of the calories. Try to choose foods that are high in protein, fiber, and vitamins, and low in saturated fat, trans fat, and sodium. Shopping Read nutrition labels while you shop. This will help you make healthy decisions about which foods to buy. Pay attention to nutrition labels for low-fat or fat-free foods. These foods sometimes have the same number of calories or more calories than the  full-fat versions. They also often have added sugar, starch, or salt to make up for flavor that was removed with the fat. Make a grocery list of lower-calorie foods and stick to it. Cooking Try to cook your favorite foods in a healthier way. For example, try baking instead of frying. Use low-fat dairy products. Meal planning Use more fruits and vegetables. One-half of your plate should be fruits and vegetables. Include lean proteins, such as chicken, [place], and fish. Lifestyle Each week, aim to do one of the following: 150 minutes of moderate exercise, such as walking. 75 minutes of  vigorous exercise, such as running. General information Know how many calories are in the foods you eat most often. This will help you calculate calorie counts faster. Find a way of tracking calories that works for you. Get creative. Try different apps or programs if writing down calories does not work for you. What foods should I eat?  Eat nutritious foods. It is better to have a nutritious, high-calorie food, such as an avocado, than a food with few nutrients, such as a bag of potato chips. Use your calories on foods and drinks that will fill you up and will not leave you hungry soon after eating. Examples of foods that fill you up are nuts and nut butters, vegetables, lean proteins, and high-fiber foods such as whole grains. High-fiber foods are foods with more than 5 g of fiber per serving. Pay attention to calories in drinks. Low-calorie drinks include water and unsweetened drinks. The items listed above may not be a complete list of foods and beverages you can eat. Contact a dietitian for more information. What foods should I limit? Limit foods or drinks that are not good sources of vitamins, minerals, or protein or that are high in unhealthy fats. These include: Candy. Other sweets. Sodas, specialty coffee drinks, alcohol, and juice. The items listed above may not be a complete list of foods and  beverages you should avoid. Contact a dietitian for more information. How do I count calories when eating out? Pay attention to portions. Often, portions are much larger when eating out. Try these tips to keep portions smaller: Consider sharing a meal instead of getting your own. If you get your own meal, eat only half of it. Before you start eating, ask for a container and put half of your meal into it. When available, consider ordering smaller portions from the menu instead of full portions. Pay attention to your food and drink choices. Knowing the way food is cooked and what is included with the meal can help you eat fewer calories. If calories are listed on the menu, choose the lower-calorie options. Choose dishes that include vegetables, fruits, whole grains, low-fat dairy products, and lean proteins. Choose items that are boiled, broiled, grilled, or steamed. Avoid items that are buttered, battered, fried, or served with cream sauce. Items labeled as crispy are usually fried, unless stated otherwise. Choose water, low-fat milk, unsweetened iced tea, or other drinks without added sugar. If you want an alcoholic beverage, choose a lower-calorie option, such as a glass of wine or light beer. Ask for dressings, sauces, and syrups on the side. These are usually high in calories, so you should limit the amount you eat. If you want a salad, choose a garden salad and ask for grilled meats. Avoid extra toppings such as bacon, cheese, or fried items. Ask for the dressing on the side, or ask for olive oil and vinegar or lemon to use as dressing. Estimate how many servings of a food you are given. Knowing serving sizes will help you be aware of how much food you are eating at restaurants. Where to find more information Centers for Disease Control and Prevention: FootballExhibition.com.br U.S. Department of Agriculture: WrestlingReporter.dk Summary Calorie counting means keeping track of how many calories you eat and drink each  day. If you eat fewer calories than your body needs, you should lose weight. A healthy amount of weight to lose per week is usually 1-2 lb (0.5-0.9 kg). This usually means reducing your daily calorie intake by 500-750 calories.  The number of calories in a food can be found on a Nutrition Facts label. If a food does not have a Nutrition Facts label, try to look up the calories online or ask your dietitian for help. Use smaller plates, glasses, and bowls for smaller portions and to prevent overeating. Use your calories on foods and drinks that will fill you up and not leave you hungry shortly after a meal. This information is not intended to replace advice given to you by your health care provider. Make sure you discuss any questions you have with your health care provider. Document Revised: 03/02/2019 Document Reviewed: 03/02/2019 Elsevier Patient Education  2023 [employer].

## 2022-10-22 ENCOUNTER — Ambulatory Visit: Payer: BC Managed Care – PPO

## 2022-10-29 ENCOUNTER — Ambulatory Visit (INDEPENDENT_AMBULATORY_CARE_PROVIDER_SITE_OTHER): Payer: BC Managed Care – PPO

## 2022-10-29 DIAGNOSIS — Z23 Encounter for immunization: Secondary | ICD-10-CM

## 2022-10-29 NOTE — Progress Notes (Signed)
pt is here for FLU VACCINE. pt received FLU VACCINE in LEFT DELTOID. Given by Rosine Abe . Pt tolerated VACCINE well.

## 2022-11-11 ENCOUNTER — Encounter: Payer: Self-pay | Admitting: Nurse Practitioner

## 2022-11-18 DIAGNOSIS — Z113 Encounter for screening for infections with a predominantly sexual mode of transmission: Secondary | ICD-10-CM | POA: Diagnosis not present

## 2022-11-18 DIAGNOSIS — Z1159 Encounter for screening for other viral diseases: Secondary | ICD-10-CM | POA: Diagnosis not present

## 2022-11-18 DIAGNOSIS — Z114 Encounter for screening for human immunodeficiency virus [HIV]: Secondary | ICD-10-CM | POA: Diagnosis not present

## 2022-11-18 DIAGNOSIS — Z118 Encounter for screening for other infectious and parasitic diseases: Secondary | ICD-10-CM | POA: Diagnosis not present

## 2022-12-28 DIAGNOSIS — Z01419 Encounter for gynecological examination (general) (routine) without abnormal findings: Secondary | ICD-10-CM | POA: Diagnosis not present

## 2022-12-28 DIAGNOSIS — N943 Premenstrual tension syndrome: Secondary | ICD-10-CM | POA: Diagnosis not present

## 2022-12-28 DIAGNOSIS — Z1231 Encounter for screening mammogram for malignant neoplasm of breast: Secondary | ICD-10-CM | POA: Diagnosis not present

## 2022-12-28 DIAGNOSIS — Z01411 Encounter for gynecological examination (general) (routine) with abnormal findings: Secondary | ICD-10-CM | POA: Diagnosis not present

## 2022-12-28 DIAGNOSIS — Z1331 Encounter for screening for depression: Secondary | ICD-10-CM | POA: Diagnosis not present

## 2023-01-05 ENCOUNTER — Encounter: Payer: BC Managed Care – PPO | Admitting: Nurse Practitioner

## 2023-01-13 DIAGNOSIS — N6012 Diffuse cystic mastopathy of left breast: Secondary | ICD-10-CM | POA: Diagnosis not present

## 2023-01-13 DIAGNOSIS — R92322 Mammographic fibroglandular density, left breast: Secondary | ICD-10-CM | POA: Diagnosis not present

## 2023-01-13 DIAGNOSIS — N6002 Solitary cyst of left breast: Secondary | ICD-10-CM | POA: Diagnosis not present

## 2023-01-13 DIAGNOSIS — R928 Other abnormal and inconclusive findings on diagnostic imaging of breast: Secondary | ICD-10-CM | POA: Diagnosis not present

## 2023-02-22 DIAGNOSIS — N852 Hypertrophy of uterus: Secondary | ICD-10-CM | POA: Diagnosis not present

## 2023-03-15 DIAGNOSIS — L710 Perioral dermatitis: Secondary | ICD-10-CM | POA: Diagnosis not present

## 2023-03-18 ENCOUNTER — Encounter: Payer: Self-pay | Admitting: Nurse Practitioner

## 2023-03-18 ENCOUNTER — Ambulatory Visit: Payer: BC Managed Care – PPO | Admitting: Nurse Practitioner

## 2023-03-18 VITALS — BP 110/80 | HR 72 | Temp 98.5°F | Ht 65.0 in | Wt 248.2 lb

## 2023-03-18 DIAGNOSIS — E66813 Obesity, class 3: Secondary | ICD-10-CM | POA: Diagnosis not present

## 2023-03-18 DIAGNOSIS — E7800 Pure hypercholesterolemia, unspecified: Secondary | ICD-10-CM | POA: Diagnosis not present

## 2023-03-18 DIAGNOSIS — R7301 Impaired fasting glucose: Secondary | ICD-10-CM | POA: Diagnosis not present

## 2023-03-18 DIAGNOSIS — F330 Major depressive disorder, recurrent, mild: Secondary | ICD-10-CM

## 2023-03-18 DIAGNOSIS — Z6841 Body Mass Index (BMI) 40.0 and over, adult: Secondary | ICD-10-CM

## 2023-03-18 LAB — LIPID PANEL
Cholesterol: 133 mg/dL (ref 0–200)
HDL: 38.8 mg/dL — ABNORMAL LOW (ref >39.00)
LDL Cholesterol: 57 mg/dL (ref 0–99)
NonHDL: 94.69
Total CHOL/HDL Ratio: 3
Triglycerides: 189 mg/dL — ABNORMAL HIGH (ref 0.0–149.0)
VLDL: 37.8 mg/dL (ref 0.0–40.0)

## 2023-03-18 LAB — HEMOGLOBIN A1C: Hgb A1c MFr Bld: 6.1 % (ref 4.6–6.5)

## 2023-03-18 NOTE — Progress Notes (Signed)
Established Patient Visit  Patient: Kathy [name]   DOB: Jan 06, 1976   48 y.o. Female  MRN: [id number] Visit Date: 03/18/2023  Subjective:    Chief Complaint  Patient presents with   Form Completion    Work Physical with lab work-patient is not fasting   HPI Recurrent major depression (HCC) Stable mood with intermittent episodes of low self esteem, easily crying, easily overwhelmed, lack of motivation. No SI/HI/hallucination Denies need for therapy and medication at this time  Elevated LDL cholesterol level Repeat lipid panel Advised about need for heart healthy diet and regular exercise  Impaired fasting glucose Repeat hgbA1c  Class 3 severe obesity due to excess calories with serious comorbidity and body mass index (BMI) of 40.0 to 44.9 in adult [facility]) Hx of prediabetes and hyperlipidemia. Advised to maintain heart healthy diet and daily exercise Wt Readings from Last 3 Encounters:  03/18/23 248 lb 3.2 oz (112.6 kg)  10/02/22 244 lb 12.8 oz (111 kg)  07/03/22 238 lb 3.2 oz (108 kg)        03/18/2023    1:31 PM 03/18/2023    1:24 PM 10/02/2022    9:08 AM  Depression screen PHQ 2/9  Decreased Interest 1 1 1   Down, Depressed, Hopeless 1 1 1   PHQ - 2 Score 2 2 2   Altered sleeping 1  0  Tired, decreased energy 0  0  Change in appetite 0  0  Feeling bad or failure about yourself  1  1  Trouble concentrating 0  0  Moving slowly or fidgety/restless 0  0  Suicidal thoughts 0  1  PHQ-9 Score 4  4  Difficult doing work/chores Not difficult at all  Not difficult at all       03/18/2023    1:32 PM 10/02/2022    9:09 AM 07/03/2022   10:28 AM 12/30/2021    9:13 AM  GAD 7 : Generalized Anxiety Score  Nervous, Anxious, on Edge 0 1 1 0  Control/stop worrying 2 3 2 1   Worry too much - different things 2 3 2 1   Trouble relaxing 0 0 0 0  Restless 0 0 0 0  Easily annoyed or irritable 0 1 1 1   Afraid - awful might happen 2 1 2  0  Total GAD 7 Score 6 9 8 3    Anxiety Difficulty Not difficult at all Not difficult at all Not difficult at all Not difficult at all     Wt Readings from Last 3 Encounters:  03/18/23 248 lb 3.2 oz (112.6 kg)  10/02/22 244 lb 12.8 oz (111 kg)  07/03/22 238 lb 3.2 oz (108 kg)    Reviewed medical, surgical, and social history today  Medications: Outpatient Medications Prior to Visit  Medication Sig   ALPHA LIPOIC ACID PO daily.   Ascorbic Acid (VITAMIN C PO) daily.   ASHWAGANDHA 35 PO Take by mouth.   Aspirin-Acetaminophen-Caffeine (EXCEDRIN PO) Take by mouth.   cetirizine (ZYRTEC) 10 MG tablet Take 10 mg by mouth daily. As needed in Spring   Cholecalciferol (VITAMIN D3 PO) daily.   Cyanocobalamin (B-12 PO) daily.   ibuprofen (ADVIL) 600 MG tablet Take 1 tablet (600 mg total) by mouth every 6 (six) hours as needed.   Multiple Vitamin (MULTIVITAMIN) tablet Take 1 tablet by mouth daily.   TURMERIC PO turmeric   valACYclovir (VALTREX) 1000 MG tablet Take 1 tablet (1,000 mg total)  by mouth 2 (two) times daily. [name].Massing   VITAMIN D PO daily.   acetaminophen (TYLENOL) 650 MG CR tablet Take 650 mg by mouth every 8 (eight) hours as needed for pain. (Patient not taking: Reported on 03/18/2023)   No facility-administered medications prior to visit.   Reviewed past medical and social history.   ROS per HPI above      Objective:  BP 110/80 (BP Location: Left Arm, Patient Position: Sitting, Cuff Size: Normal)   Pulse 72   Temp 98.5 F (36.9 C)   Ht 5\' 5"  (1.651 m)   Wt 248 lb 3.2 oz (112.6 kg)   LMP 02/27/2023 (Exact Date)   SpO2 98%   BMI 41.30 kg/m      Physical Exam Vitals and nursing note reviewed.  Constitutional:      Appearance: She is obese.  Cardiovascular:     Rate and Rhythm: Normal rate.     Pulses: Normal pulses.  Pulmonary:     Effort: Pulmonary effort is normal.  Neurological:     Mental Status: She is alert and oriented to person, place, and time.     No results found for any visits  on 03/18/23.    Assessment & Plan:    Problem List Items Addressed This Visit     Class 3 severe obesity due to excess calories with serious comorbidity and body mass index (BMI) of 40.0 to 44.9 in adult [facility])   Hx of prediabetes and hyperlipidemia. Advised to maintain heart healthy diet and daily exercise Wt Readings from Last 3 Encounters:  03/18/23 248 lb 3.2 oz (112.6 kg)  10/02/22 244 lb 12.8 oz (111 kg)  07/03/22 238 lb 3.2 oz (108 kg)         Elevated LDL cholesterol level   Repeat lipid panel Advised about need for heart healthy diet and regular exercise      Relevant Orders   Lipid panel   Impaired fasting glucose - Primary   Repeat hgbA1c      Relevant Orders   Hemoglobin A1c   Recurrent major depression (HCC)   Stable mood with intermittent episodes of low self esteem, easily crying, easily overwhelmed, lack of motivation. No SI/HI/hallucination Denies need for therapy and medication at this time      Return in about 6 months (around 09/15/2023) for CPE (fasting).     Alysia Penna, NP

## 2023-03-18 NOTE — Assessment & Plan Note (Signed)
Hx of prediabetes and hyperlipidemia. Advised to maintain heart healthy diet and daily exercise Wt Readings from Last 3 Encounters:  03/18/23 248 lb 3.2 oz (112.6 kg)  10/02/22 244 lb 12.8 oz (111 kg)  07/03/22 238 lb 3.2 oz (108 kg)

## 2023-03-18 NOTE — Assessment & Plan Note (Signed)
Stable mood with intermittent episodes of low self esteem, easily crying, easily overwhelmed, lack of motivation. No SI/HI/hallucination Denies need for therapy and medication at this time

## 2023-03-18 NOTE — Assessment & Plan Note (Signed)
Repeat lipid panel Advised about need for heart healthy diet and regular exercise

## 2023-03-18 NOTE — Assessment & Plan Note (Signed)
Repeat hgbA1c

## 2023-03-19 ENCOUNTER — Telehealth: Payer: Self-pay

## 2023-03-19 ENCOUNTER — Encounter: Payer: Self-pay | Admitting: Nurse Practitioner

## 2023-03-19 NOTE — Telephone Encounter (Signed)
Wellness form completed by Kathy [name] and faxed to [facility] at [phone]. I will send patient a [facility] message that form completed and faxed and can be picked up.

## 2023-03-29 DIAGNOSIS — L710 Perioral dermatitis: Secondary | ICD-10-CM | POA: Diagnosis not present

## 2023-04-02 ENCOUNTER — Encounter: Payer: Self-pay | Admitting: Nurse Practitioner

## 2023-05-13 ENCOUNTER — Ambulatory Visit (INDEPENDENT_AMBULATORY_CARE_PROVIDER_SITE_OTHER): Payer: BC Managed Care – PPO

## 2023-06-02 DIAGNOSIS — Z1159 Encounter for screening for other viral diseases: Secondary | ICD-10-CM | POA: Diagnosis not present

## 2023-06-02 DIAGNOSIS — Z114 Encounter for screening for human immunodeficiency virus [HIV]: Secondary | ICD-10-CM | POA: Diagnosis not present

## 2023-06-02 DIAGNOSIS — Z118 Encounter for screening for other infectious and parasitic diseases: Secondary | ICD-10-CM | POA: Diagnosis not present

## 2023-06-02 DIAGNOSIS — Z113 Encounter for screening for infections with a predominantly sexual mode of transmission: Secondary | ICD-10-CM | POA: Diagnosis not present

## 2023-07-23 DIAGNOSIS — N6489 Other specified disorders of breast: Secondary | ICD-10-CM | POA: Diagnosis not present

## 2023-07-23 DIAGNOSIS — N6002 Solitary cyst of left breast: Secondary | ICD-10-CM | POA: Diagnosis not present

## 2023-08-03 DIAGNOSIS — Z1159 Encounter for screening for other viral diseases: Secondary | ICD-10-CM | POA: Diagnosis not present

## 2023-08-03 DIAGNOSIS — Z118 Encounter for screening for other infectious and parasitic diseases: Secondary | ICD-10-CM | POA: Diagnosis not present

## 2023-08-03 DIAGNOSIS — B3731 Acute candidiasis of vulva and vagina: Secondary | ICD-10-CM | POA: Diagnosis not present

## 2023-08-03 DIAGNOSIS — Z113 Encounter for screening for infections with a predominantly sexual mode of transmission: Secondary | ICD-10-CM | POA: Diagnosis not present

## 2023-08-03 DIAGNOSIS — Z114 Encounter for screening for human immunodeficiency virus [HIV]: Secondary | ICD-10-CM | POA: Diagnosis not present

## 2023-08-03 DIAGNOSIS — N760 Acute vaginitis: Secondary | ICD-10-CM | POA: Diagnosis not present

## 2023-08-23 ENCOUNTER — Other Ambulatory Visit: Payer: Self-pay | Admitting: Nurse Practitioner

## 2023-08-23 DIAGNOSIS — A601 Herpesviral infection of perianal skin and rectum: Secondary | ICD-10-CM

## 2023-08-25 NOTE — Telephone Encounter (Signed)
 Medication: Valacyclovir  (Valtrex ) 1000 mg  Directions: Take 1 tablet by mouth 2 times a day for 3 to 5 days Last given: 12/16/21 Number refills: 1 Last o/v: 03/18/23 Follow up: 6 months-Scheduled for 11/05/23 (CPE) Labs:

## 2023-10-22 DIAGNOSIS — L292 Pruritus vulvae: Secondary | ICD-10-CM | POA: Diagnosis not present

## 2023-10-22 DIAGNOSIS — B3731 Acute candidiasis of vulva and vagina: Secondary | ICD-10-CM | POA: Diagnosis not present

## 2023-10-22 DIAGNOSIS — N898 Other specified noninflammatory disorders of vagina: Secondary | ICD-10-CM | POA: Diagnosis not present

## 2023-10-22 DIAGNOSIS — Z118 Encounter for screening for other infectious and parasitic diseases: Secondary | ICD-10-CM | POA: Diagnosis not present

## 2023-10-25 ENCOUNTER — Encounter (INDEPENDENT_AMBULATORY_CARE_PROVIDER_SITE_OTHER): Payer: Self-pay | Admitting: Nurse Practitioner

## 2023-10-25 DIAGNOSIS — R21 Rash and other nonspecific skin eruption: Secondary | ICD-10-CM

## 2023-10-26 DIAGNOSIS — R21 Rash and other nonspecific skin eruption: Secondary | ICD-10-CM | POA: Diagnosis not present

## 2023-10-26 NOTE — Telephone Encounter (Signed)
 Copied from CRM #[id number]. Topic: Referral - Request for Referral >> Oct 26, 2023  3:02 PM Viola F wrote: Did the patient discuss referral with their provider in the last year? No (If No - schedule appointment) (If Yes - send message)  Appointment offered? Yes, patient refused appointment   Type of order/referral and detailed reason for visit: Patient said she sent Roselie a MyChart message on the reason why she needs referral (I do not see it)   Preference of office, provider, location: Dr. Delon Lenis [facility]   If referral order, have you been seen by this specialty before? No (If Yes, this issue or another issue? When? Where?  Can we respond through MyChart? Yes

## 2023-10-26 NOTE — Telephone Encounter (Signed)
Please see the MyChart message reply(ies) for my assessment and plan.  The patient gave consent for this Medical Advice Message and is aware that it may result in a bill to their insurance company as well as the possibility that this may result in a co-payment or deductible. They are an established patient, but are not seeking medical advice exclusively about a problem treated during an in person or video visit in the last 7 days. I did not recommend an in person or video visit within 7 days of my reply.  I spent a total of 15 minutes cumulative time within 7 days through [place], NP

## 2023-11-05 ENCOUNTER — Other Ambulatory Visit (HOSPITAL_COMMUNITY)
Admission: RE | Admit: 2023-11-05 | Discharge: 2023-11-05 | Disposition: A | Discharge status: Home / Self Care | Source: Ambulatory Visit | Attending: Nurse Practitioner | Admitting: Nurse Practitioner

## 2023-11-05 ENCOUNTER — Encounter: Payer: Self-pay | Admitting: Nurse Practitioner

## 2023-11-05 ENCOUNTER — Ambulatory Visit: Admitting: Nurse Practitioner

## 2023-11-05 VITALS — BP 118/78 | HR 68 | Temp 98.1°F | Ht 65.0 in | Wt 244.4 lb

## 2023-11-05 DIAGNOSIS — R7301 Impaired fasting glucose: Secondary | ICD-10-CM | POA: Diagnosis not present

## 2023-11-05 DIAGNOSIS — Z1211 Encounter for screening for malignant neoplasm of colon: Secondary | ICD-10-CM | POA: Diagnosis not present

## 2023-11-05 DIAGNOSIS — Z113 Encounter for screening for infections with a predominantly sexual mode of transmission: Secondary | ICD-10-CM | POA: Insufficient documentation

## 2023-11-05 DIAGNOSIS — E7800 Pure hypercholesterolemia, unspecified: Secondary | ICD-10-CM | POA: Diagnosis not present

## 2023-11-05 DIAGNOSIS — Z23 Encounter for immunization: Secondary | ICD-10-CM

## 2023-11-05 DIAGNOSIS — Z1159 Encounter for screening for other viral diseases: Secondary | ICD-10-CM | POA: Diagnosis not present

## 2023-11-05 DIAGNOSIS — Z0001 Encounter for general adult medical examination with abnormal findings: Secondary | ICD-10-CM

## 2023-11-05 LAB — COMPREHENSIVE METABOLIC PANEL WITH GFR
ALT: 19 U/L (ref 0–35)
AST: 20 U/L (ref 0–37)
Albumin: 4.6 g/dL (ref 3.5–5.2)
Alkaline Phosphatase: 34 U/L — ABNORMAL LOW (ref 39–117)
BUN: 14 mg/dL (ref 6–23)
CO2: 25 meq/L (ref 19–32)
Calcium: 9.2 mg/dL (ref 8.4–10.5)
Chloride: 101 meq/L (ref 96–112)
Creatinine, Ser: 0.9 mg/dL (ref 0.40–1.20)
GFR: 76.95 mL/min (ref >60.00)
Glucose, Bld: 89 mg/dL (ref 70–99)
Potassium: 3.9 meq/L (ref 3.5–5.1)
Sodium: 135 meq/L (ref 135–145)
Total Bilirubin: 0.5 mg/dL (ref 0.2–1.2)
Total Protein: 7.5 g/dL (ref 6.0–8.3)

## 2023-11-05 LAB — LIPID PANEL
Cholesterol: 174 mg/dL (ref 0–200)
HDL: 49.1 mg/dL (ref >39.00)
LDL Cholesterol: 107 mg/dL — ABNORMAL HIGH (ref 0–99)
NonHDL: 125.05
Total CHOL/HDL Ratio: 4
Triglycerides: 90 mg/dL (ref 0.0–149.0)
VLDL: 18 mg/dL (ref 0.0–40.0)

## 2023-11-05 LAB — HEMOGLOBIN A1C: Hgb A1c MFr Bld: 5.9 % (ref 4.6–6.5)

## 2023-11-05 NOTE — Patient Instructions (Signed)
 Go to lab Maintain Heart healthy diet and daily exercise. Maintain current medications. Schedule appointment for annual mammogram  Hip Exercises Ask your health care provider which exercises are safe for you. Do exercises exactly as told by your provider and adjust them as told. It is normal to feel mild stretching, pulling, tightness, or discomfort as you do these exercises. Stop right away if you feel sudden pain or your pain gets worse. Do not begin these exercises until told by your provider. Stretching and range-of-motion exercises These exercises warm up your muscles and joints and improve the movement and flexibility of your hip. They also help to relieve pain, numbness, and tingling. You may be asked to limit your range of motion if you had a hip replacement. Talk to your provider about these limits. Hamstrings, supine  Lie on your back (supine position). Loop a belt, towel, or exercise band over the ball of your left / right foot. The ball of your foot is on the walking surface, right under your toes. Straighten your left / right knee and slowly pull on the belt, towel, or band to raise your leg until you feel a gentle stretch behind your knee (hamstring). Do not let your knee bend while you do this. Keep your other leg flat on the floor. Hold this position for __________ seconds. Slowly return your leg to the starting position. Repeat __________ times. Complete this exercise __________ times a day. Hip rotation  Lie on your back on a firm surface. With your left / right hand, gently pull your left / right knee toward the shoulder that is on the same side of the body. Stop when your knee is pointing toward the ceiling. Hold your left / right ankle with your other hand. Keeping your knee steady, gently pull your left / right ankle toward your other shoulder until you feel a stretch in your butt. Keep your hips and shoulders firmly planted while you do this stretch. Hold this  position for __________ seconds. Repeat __________ times. Complete this exercise __________ times a day. Seated stretch This exercise is sometimes called hamstrings and adductors stretch. Sit on the floor with your legs stretched wide. Keep your knees straight during this exercise. Keeping your head and back in a straight line, bend at your waist to reach for your left foot (position A). You should feel a stretch in your right inner thigh (adductors). Hold this position for __________ seconds. Then slowly return to the upright position. Keeping your head and back in a straight line, bend at your waist to reach forward (position B). You should feel a stretch behind both of your thighs and knees (hamstrings). Hold this position for __________ seconds. Then slowly return to the upright position. Keeping your head and back in a straight line, bend at your waist to reach for your right foot (position C). You should feel a stretch in your left inner thigh (adductors). Hold this position for __________ seconds. Then slowly return to the upright position. Repeat __________ times. Complete this exercise __________ times a day. Lunge This exercise stretches the muscles of the hip (hip flexors). Place your left / right knee on the floor and bend your other knee so that is directly over your ankle. You should be half-kneeling. Keep good posture with your head over your shoulders. Tighten your butt muscles to point your tailbone downward. This will prevent your back from arching too much. You should feel a gentle stretch in the front of your left /  right thigh and hip. If you do not feel a stretch, slide your other foot forward slightly and then slowly lunge forward with your chest up until your knee once again lines up over your ankle. Make sure your tailbone continues to point downward. Hold this position for __________ seconds. Slowly return to the starting position. Repeat __________ times. Complete this  exercise __________ times a day. Strengthening exercises These exercises build strength and endurance in your hip. Endurance is the ability to use your muscles for a long time, even after they get tired. Bridge This exercise strengthens the muscles of your hip (hip extensors). Lie on your back on a firm surface with your knees bent and your feet flat on the floor. Tighten your butt muscles and lift your bottom off the floor until the trunk of your body and your hips are level with your thighs. Do not arch your back. You should feel the muscles working in your butt and the back of your thighs. If you do not feel these muscles, slide your feet 1-2 inches (2.5-5 cm) farther away from your butt. Hold this position for __________ seconds. Slowly lower your hips to the starting position. Let your muscles relax completely between repetitions. Repeat __________ times. Complete this exercise __________ times a day. Straight leg raises, side-lying This exercise strengthens the muscles that move the hip joint away from the center of the body (hip abductors). Lie on your side with your left / right leg in the top position. Lie so your head, shoulder, hip, and knee line up. You may bend your bottom knee slightly to help you balance. Roll your hips slightly forward, so your hips are stacked directly over each other and your left / right knee is facing forward. Leading with your heel, lift your top leg 4-6 inches (10-15 cm). You should feel the muscles in your top hip lifting. Do not let your foot drift forward. Do not let your knee roll toward the ceiling. Hold this position for __________ seconds. Slowly return to the starting position. Let your muscles relax completely between repetitions. Repeat __________ times. Complete this exercise __________ times a day. Straight leg raises, side-lying This exercise strengthens the muscles that move the hip joint toward the center of the body (hip adductors). Lie  on your side with your left / right leg in the bottom position. Lie so your head, shoulder, hip, and knee line up. You may place your upper foot in front to help you balance. Roll your hips slightly forward, so your hips are stacked directly over each other and your left / right knee is facing forward. Tense the muscles in your inner thigh and lift your bottom leg 4-6 inches (10-15 cm). Hold this position for __________ seconds. Slowly return to the starting position. Let your muscles relax completely between repetitions. Repeat __________ times. Complete this exercise __________ times a day. Straight leg raises, supine This exercise strengthens the muscles in the front of your thigh (quadriceps and hip flexors). Lie on your back (supine position) with your left / right leg extended and your other knee bent. Tense the muscles in the front of your left / right thigh. You should see your kneecap slide up or see increased dimpling just above your knee. Keep these muscles tight as you raise your leg 4-6 inches (10-15 cm) off the floor. Do not let your knee bend. Hold this position for __________ seconds. Keep these muscles tense as you lower your leg. Relax the muscles slowly  and completely between repetitions. Repeat __________ times. Complete this exercise __________ times a day. Hip abductors, standing This exercise strengthens the muscles that move the leg and hip joint away from the center of the body (hip abductors). Tie one end of a rubber exercise band or tubing to a secure surface, such as a chair, table, or pole. Loop the other end of the band or tubing around your left / right ankle. Keeping your ankle with the band or tubing directly opposite the secured end, step away until there is tension in the tubing or band. Hold on to a chair, table, or pole as needed for balance. Lift your left / right leg out to your side. While you do this: Keep your back upright. Keep your shoulders over  your hips. Keep your toes pointing forward. Make sure to use your hip muscles to slowly lift your leg. Do not tip your body or forcefully lift your leg. Hold this position for __________ seconds. Slowly return to the starting position. Repeat __________ times. Complete this exercise __________ times a day. Squats This exercise strengthens the muscles in the front of your thigh (quadriceps). Stand in front of a table, or stand in a doorframe so your feet and knees are in line with the frame. You may place your hands on the table or frame for balance. Slowly bend your knees and lower your hips like you are going to sit in a chair. Keep your lower legs in a straight up-and-down position. Do not let your hips go lower than your knees. Do not bend your knees lower than told by your provider. If your hip pain increases, do not bend as low. Hold this position for ___________ seconds. Slowly push with your legs to return to standing. Do not use your hands to pull yourself to standing. Repeat __________ times. Complete this exercise __________ times a day. This information is not intended to replace advice given to you by your health care provider. Make sure you discuss any questions you have with your health care provider. Document Revised: 09/23/2021 Document Reviewed: 09/23/2021 Elsevier Patient Education  2024 [employer]  Snapping Hip Syndrome Rehab Ask your health care provider which exercises are safe for you. Do exercises exactly as told by your health care provider and adjust them as directed. It is normal to feel mild stretching, pulling, tightness, or discomfort as you do these exercises. Stop right away if you feel sudden pain or your pain gets worse. Do not begin these exercises until told by your health care provider. Stretching and range-of-motion exercises These exercises warm up your muscles and joints and improve the movement and flexibility of your hip and pelvis. These exercises  also help to relieve pain and stiffness. Hip rotators Hip rotators are the muscles that keep the hip joint together. Lie on your back on a firm surface. With your left / right hand, gently pull your left / right knee toward the shoulder that is on the same side of the body. Stop when your knee is pointing toward the ceiling. Hold your left / right ankle with your other hand. Keeping your knee steady, gently pull your left / right ankle toward your other shoulder until you feel a stretch in your buttocks. Keep your hips and shoulders firmly planted while you do this stretch. Hold this position for __________ seconds. Slowly return to the starting position. Repeat __________ times. Complete this exercise __________ times a day. Iliotibial band stretch An iliotibial band is a  strong band of tissue that runs from the outer side of your hip to the outer side of your thigh and knee. Lie on your side with your left / right leg in the top position. Bend your left / right knee and grab your ankle. Stretch out your bottom arm to help you balance. Slowly bring your knee back so your thigh is behind your body. Slowly lower your knee toward the floor until you feel a gentle stretch on the outside of your left / right thigh. If you do not feel a stretch and your knee will not fall farther, place the heel of your other foot on top of your knee and pull your knee down toward the floor with your foot. Hold this position for __________ seconds. Slowly return to the starting position. Repeat __________ times. Complete this exercise __________ times a day. Lunge This exercise is also called a hip flexor stretch. Place your left / right knee on the floor and bend your other knee so that it is directly over your ankle. You should be half-kneeling. Keep good posture with your head over your shoulders. Tighten your buttocks to point your tailbone downward. This will prevent your back from arching too much. You  should feel a gentle stretch in the front of your left / right thigh and hip (hip flexors). If you do not feel a stretch, slide your front foot forward slightly and then slowly lunge forward with your chest up until your knee once again lines up over your ankle. Make sure your tailbone continues to point downward. Hold this position for __________ seconds. Slowly return to the starting position. Repeat __________ times. Complete this exercise __________ times a day. Strengthening exercises These exercises build strength and endurance in your hip and pelvis. Endurance is the ability to use your muscles for a long time, even after they get tired. Straight leg raises, side-lying This exercise is sometimes called a hip abductor exercise. It strengthens the muscles that rotate the leg at the hip and move it away from your body (hip abduction). Lie on your side with your left / right leg in the top position. Lie so your head, shoulder, hip, and knee line up. Bend your bottom knee slightly to help you balance. Lift your top leg 4-6 inches (10-15 cm) while keeping your toes pointed straight ahead. Hold this position for __________ seconds. Slowly lower your leg to the starting position. Let your muscles relax completely after each repetition. Repeat __________ times. Complete this exercise __________ times a day. Hip abductors and rotators, quadruped This exercise strengthens the muscles that keep the hip together (hip rotators) along with the muscles that move the leg and hip away from your body (hip abductors). Get on your hands and knees on a firm, lightly padded surface. This is the quadruped position. Your hands should be directly below your shoulders, and your knees should be directly below your hips. Lift your left / right knee out to the side. Keep your knee bent. Do not twist your body. Hold this position for __________ seconds. Slowly lower your leg back to the starting position. Repeat  __________ times. Complete this exercise __________ times a day. This information is not intended to replace advice given to you by your health care provider. Make sure you discuss any questions you have with your health care provider. Document Revised: 05/30/2021 Document Reviewed: 05/30/2021 Elsevier Patient Education  2024 [employer].

## 2023-11-05 NOTE — Progress Notes (Signed)
 Complete physical exam  Patient: Kathy [name]   DOB: September 19, 1975   47 y.o. Female  MRN: [id number] Visit Date: 11/05/2023  Subjective:    Chief Complaint  Patient presents with   Annual Exam    FASTING  Wants flu vaccine  DUE for colonoscopy  Requesting records for Mammogram    Kathy [name] is a 48 y.o. female who presents today for a complete physical exam. She reports consuming a general diet. Cardio and calisthenics 2x/week She generally feels well. She reports sleeping well. She does have additional problems to discuss today.  Vision:No Dental:Yes STD Screen:yes  BP Readings from Last 3 Encounters:  11/05/23 118/78  03/18/23 110/80  10/02/22 110/80   Wt Readings from Last 3 Encounters:  11/05/23 244 lb 6.4 oz (110.9 kg)  03/18/23 248 lb 3.2 oz (112.6 kg)  10/02/22 244 lb 12.8 oz (111 kg)    Most recent fall risk assessment:    11/05/2023   10:07 AM  Fall Risk   Falls in the past year? 0  Injury with Fall? 0  Risk for fall due to : No Fall Risks  Follow up Falls evaluation completed     Depression screen:Yes - No Depression Most recent depression screenings:    11/05/2023   10:07 AM 03/18/2023    1:31 PM  PHQ 2/9 Scores  PHQ - 2 Score 0 2  PHQ- 9 Score 0 4    HPI  No problem-specific Assessment & Plan notes found for this encounter.   Past Medical History:  Diagnosis Date   Anxiety    Arthritis    Back pain    Chest pain    Chest pain 02/11/2017   Dizziness    Fatigue    Generalized headaches    Hand tingling    Hip pain    HLD (hyperlipidemia)    HSV (herpes simplex virus) infection    Knee pain    Lateral epicondylitis of right elbow 05/29/2019   Multiple food allergies    Avocado, Banana, [name], [name], sometimes apples:  Nausea, vomitting itching in throat   Palpitations    Postpartum care following vaginal delivery 10/05/2010   Past Surgical History:  Procedure Laterality Date   NO PAST SURGERIES     Social  History   Socioeconomic History   Marital status: Single    Spouse name: Not on file   Number of children: Not on file   Years of education: Not on file   Highest education level: Not on file  Occupational History   Occupation: Client Associate    Employer: [employer]  Tobacco Use   Smoking status: Former    Current packs/day: 0.00    Average packs/day: 1 pack/day for 10.0 years (10.0 ttl pk-yrs)    Types: Cigarettes    Start date: 89    Quit date: 2007    Years since quitting: 18.7   Smokeless tobacco: Never  Vaping Use   Vaping status: Never Used  Substance and Sexual Activity   Alcohol use: Yes    Comment: rare on ocassion   Drug use: No   Sexual activity: Yes    Partners: Male  Other Topics Concern   Not on file  Social History Narrative   Not on file   Social Drivers of Health   Financial Resource Strain: Not on file  Food Insecurity: No Food Insecurity (10/02/2022)   Hunger Vital Sign    Worried About Running Out of Food in  the Last Year: Never true    Ran Out of Food in the Last Year: Never true  Transportation Needs: No Transportation Needs (07/03/2022)   PRAPARE - Administrator, Civil Service (Medical): No    Lack of Transportation (Non-Medical): No  Physical Activity: Not on file  Stress: Not on file  Social Connections: Not on file  Intimate Partner Violence: Not on file   Family Status  Relation Name Status   Mother  Alive   MGM  Alive   MGF  Alive   PGM  Deceased   PGF  Deceased   Father  Alive  No partnership data on file   Family History  Problem Relation Age of Onset   Lymphoma Mother    Breast cancer Maternal Grandmother    Diabetes Maternal Grandmother    Diabetes Maternal Grandfather    Cancer Maternal Grandfather        Unsure if Prostate or colon   Diabetes Paternal Grandmother    Diabetes Paternal Grandfather    Obesity Father    Allergies  Allergen Reactions   Avocado Other (See Comments)    Patient Care  Team: Sharanya Templin, Roselie Rockford, NP as PCP - General (Internal Medicine) Gasper Clarity, NP as Nurse Practitioner (Obstetrics and Gynecology) Claudene Muskrat, OD (Optometry) Obgyn, Anna   Medications: Outpatient Medications Prior to Visit  Medication Sig   Aspirin-Acetaminophen -Caffeine (EXCEDRIN PO) Take by mouth. (Patient taking differently: Take by mouth as needed.)   ibuprofen  (ADVIL ) 600 MG tablet Take 1 tablet (600 mg total) by mouth every 6 (six) hours as needed.   TURMERIC PO turmeric   valACYclovir  (VALTREX ) 1000 MG tablet TAKE 1 TABLET(1000 MG) BY MOUTH TWICE DAILY FOR 3 TO 5 DAYS (Patient taking differently: Take 1,000 mg by mouth as needed.)   [DISCONTINUED] ALPHA LIPOIC ACID PO daily. (Patient not taking: Reported on 11/05/2023)   [DISCONTINUED] Ascorbic Acid (VITAMIN C PO) daily. (Patient not taking: Reported on 11/05/2023)   [DISCONTINUED] ASHWAGANDHA 35 PO Take by mouth. (Patient not taking: Reported on 11/05/2023)   [DISCONTINUED] cetirizine  (ZYRTEC ) 10 MG tablet Take 10 mg by mouth daily. As needed in Spring (Patient not taking: Reported on 11/05/2023)   [DISCONTINUED] Cholecalciferol (VITAMIN D3 PO) daily. (Patient not taking: Reported on 11/05/2023)   [DISCONTINUED] Cyanocobalamin  (B-12 PO) daily. (Patient not taking: Reported on 11/05/2023)   [DISCONTINUED] Multiple Vitamin (MULTIVITAMIN) tablet Take 1 tablet by mouth daily. (Patient not taking: Reported on 11/05/2023)   [DISCONTINUED] VITAMIN D  PO daily. (Patient not taking: Reported on 11/05/2023)   No facility-administered medications prior to visit.    Review of Systems  Constitutional:  Negative for activity change, appetite change and unexpected weight change.  Respiratory: Negative.    Cardiovascular: Negative.   Gastrointestinal: Negative.   Endocrine: Negative for cold intolerance and heat intolerance.  Genitourinary: Negative.   Musculoskeletal: Negative.   Skin: Negative.   Neurological: Negative.    Hematological: Negative.   Psychiatric/Behavioral:  Negative for behavioral problems, decreased concentration, dysphoric mood, hallucinations, self-injury, sleep disturbance and suicidal ideas. The patient is not nervous/anxious.         Objective:  BP 118/78 (BP Location: Left Arm, Patient Position: Sitting, Cuff Size: Large)   Pulse 68   Temp 98.1 F (36.7 C) (Oral)   Ht 5' 5 (1.651 m)   Wt 244 lb 6.4 oz (110.9 kg)   LMP 10/30/2023   SpO2 96%   BMI 40.67 kg/m     Physical Exam Vitals and  nursing note reviewed.  Constitutional:      General: She is not in acute distress. HENT:     Right Ear: Tympanic membrane, ear canal and external ear normal.     Left Ear: Tympanic membrane, ear canal and external ear normal.     Nose: Nose normal.  Eyes:     Extraocular Movements: Extraocular movements intact.     Conjunctiva/sclera: Conjunctivae normal.     Pupils: Pupils are equal, round, and reactive to light.  Neck:     Thyroid : No thyroid  mass, thyromegaly or thyroid  tenderness.  Cardiovascular:     Rate and Rhythm: Normal rate and regular rhythm.     Pulses: Normal pulses.     Heart sounds: Normal heart sounds.  Pulmonary:     Effort: Pulmonary effort is normal.     Breath sounds: Normal breath sounds.  Abdominal:     General: Bowel sounds are normal.     Palpations: Abdomen is soft.  Musculoskeletal:        General: Normal range of motion.     Cervical back: Normal range of motion and neck supple.     Right lower leg: No edema.     Left lower leg: No edema.  Lymphadenopathy:     Cervical: No cervical adenopathy.  Skin:    General: Skin is warm and dry.  Neurological:     Mental Status: She is alert and oriented to person, place, and time.     Cranial Nerves: No cranial nerve deficit.  Psychiatric:        Mood and Affect: Mood normal.        Behavior: Behavior normal.        Thought Content: Thought content normal.      No results found for any visits on  11/05/23.    Assessment & Plan:    Routine Health Maintenance and Physical Exam  Immunization History  Administered Date(s) Administered   Dtap, Unspecified 01/15/1978, 02/11/1978, 04/15/1978, 04/16/1980, 09/20/1982   Influenza Inj Mdck Quad Pf 11/10/2017   Influenza, Seasonal, Injecte, Preservative Fre 10/29/2022, 11/05/2023   Influenza,inj,Quad PF,6+ Mos 11/26/2015, 11/26/2016, 10/20/2018, 12/19/2019, 11/27/2020   Influenza-Unspecified 12/19/2019   MMR 10/08/1989   Measles 09/27/1979   Mumps 09/27/1979   PFIZER(Purple Top)SARS-COV-2 Vaccination 04/30/2019, 05/21/2019   Polio, Unspecified 01/15/1978, 03/16/1978, 12/28/1978, 04/16/1980, 09/20/1982   Rubella 11/24/1980   Td (Adult),unspecified 10/25/1984, 11/10/2000   Tdap 10/06/2010, 10/02/2022    Health Maintenance  Topic Date Due   Hepatitis C Screening  Never done   Colonoscopy  Never done   Mammogram  11/19/2023   COVID-19 Vaccine (3 - 2025-26 season) 11/21/2023 (Originally 10/04/2023)   Hepatitis B Vaccines 19-59 Average Risk (1 of 3 - 19+ 3-dose series) 11/04/2024 (Originally 11/12/1996)   HPV VACCINES (1 - 3-dose SCDM series) 11/04/2024 (Originally 11/12/2004)   Cervical Cancer Screening (HPV/Pap Cotest)  12/01/2024   DTaP/Tdap/Td (8 - Td or Tdap) 10/01/2032   Influenza Vaccine  Completed   HIV Screening  Completed   Pneumococcal Vaccine  Aged Out   Meningococcal B Vaccine  Aged Out    Discussed health benefits of physical activity, and encouraged her to engage in regular exercise appropriate for her age and condition.  Problem List Items Addressed This Visit     Elevated LDL cholesterol level   Relevant Orders   Lipid panel   Impaired fasting glucose   Relevant Orders   Hemoglobin A1c   Other Visit Diagnoses  Encounter for preventative adult health care exam with abnormal findings    -  Primary   Relevant Orders   Comprehensive metabolic panel with GFR     Need for influenza vaccination        Relevant Orders   Flu vaccine trivalent PF, 6mos and older(Flulaval,Afluria,Fluarix,Fluzone) (Completed)     Colon cancer screening       Relevant Orders   Ambulatory referral to Gastroenterology     Encounter for screening for viral disease       Relevant Orders   Hepatitis C antibody   HIV Antibody (routine testing w rflx)   Urine cytology ancillary only   RPR w/reflex to TrepSure      Return in about 1 year (around 11/04/2024) for CPE (fasting).     Roselie Mood, NP

## 2023-11-06 ENCOUNTER — Encounter: Payer: Self-pay | Admitting: Nurse Practitioner

## 2023-11-06 LAB — RPR W/REFLEX TO TREPSURE: RPR: REACTIVE — AB

## 2023-11-06 LAB — HEPATITIS C ANTIBODY: Hepatitis C Ab: NONREACTIVE

## 2023-11-06 LAB — HIV ANTIBODY (ROUTINE TESTING W REFLEX)
HIV 1&2 Ab, 4th Generation: NONREACTIVE
HIV FINAL INTERPRETATION: NEGATIVE

## 2023-11-06 LAB — RPR, QUANT: RPR, Quant: 1:2 {titer} — ABNORMAL HIGH

## 2023-11-08 ENCOUNTER — Ambulatory Visit: Payer: Self-pay | Admitting: Nurse Practitioner

## 2023-11-08 ENCOUNTER — Other Ambulatory Visit: Payer: Self-pay | Admitting: Nurse Practitioner

## 2023-11-08 DIAGNOSIS — F331 Major depressive disorder, recurrent, moderate: Secondary | ICD-10-CM

## 2023-11-08 LAB — URINE CYTOLOGY ANCILLARY ONLY
Chlamydia: NEGATIVE
Comment: NEGATIVE
Comment: NEGATIVE
Comment: NORMAL
Neisseria Gonorrhea: NEGATIVE
Trichomonas: NEGATIVE

## 2023-11-08 NOTE — Telephone Encounter (Signed)
 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 I was informed that the lab order was incorrect. I was informed that the correct order is [id number]. I informed Ahmoria that this order number does not come in the epic system. I asked to be transfer to whoever is able to clarify the laborder number. Ahmoria stated she is not able to tarnsfer my call. She stated she will sent a message to get clarification.She stated she will send a message to the responsible party to see why we do not leave [id number] in our EPIC system. I asked if a verbal order can be given for the completion of treponema antibody total to be completed. Ahmoria stated she entered the order for this test to be completed by [facility].  I provided the office phone number and Fax number.

## 2023-11-09 DIAGNOSIS — N898 Other specified noninflammatory disorders of vagina: Secondary | ICD-10-CM | POA: Diagnosis not present

## 2023-11-09 DIAGNOSIS — B3731 Acute candidiasis of vulva and vagina: Secondary | ICD-10-CM | POA: Diagnosis not present

## 2023-11-09 NOTE — Telephone Encounter (Signed)
 Medication was discontinued 10/02/2022 by Roselie Mood, NP

## 2023-11-18 LAB — RPR, QUANT+TP ABS (REFLEX)
Rapid Plasma Reagin, Quant: 1:2 {titer} — ABNORMAL HIGH
T Pallidum Abs: NONREACTIVE

## 2023-11-18 LAB — RPR: RPR Ser Ql: REACTIVE — AB

## 2023-11-18 LAB — SPECIMEN STATUS REPORT

## 2023-12-01 DIAGNOSIS — M205X1 Other deformities of toe(s) (acquired), right foot: Secondary | ICD-10-CM | POA: Diagnosis not present

## 2023-12-01 DIAGNOSIS — M205X2 Other deformities of toe(s) (acquired), left foot: Secondary | ICD-10-CM | POA: Diagnosis not present

## 2023-12-01 DIAGNOSIS — M792 Neuralgia and neuritis, unspecified: Secondary | ICD-10-CM | POA: Diagnosis not present

## 2023-12-04 ENCOUNTER — Encounter: Payer: Self-pay | Admitting: Nurse Practitioner

## 2023-12-13 ENCOUNTER — Telehealth: Payer: Self-pay

## 2023-12-13 ENCOUNTER — Other Ambulatory Visit (HOSPITAL_COMMUNITY): Payer: Self-pay

## 2023-12-13 ENCOUNTER — Ambulatory Visit: Admitting: Nurse Practitioner

## 2023-12-13 ENCOUNTER — Encounter: Payer: Self-pay | Admitting: Nurse Practitioner

## 2023-12-13 VITALS — BP 122/78 | HR 75 | Temp 98.3°F | Ht 65.0 in | Wt 246.4 lb

## 2023-12-13 DIAGNOSIS — E7800 Pure hypercholesterolemia, unspecified: Secondary | ICD-10-CM | POA: Diagnosis not present

## 2023-12-13 DIAGNOSIS — E66813 Obesity, class 3: Secondary | ICD-10-CM | POA: Diagnosis not present

## 2023-12-13 DIAGNOSIS — Z6841 Body Mass Index (BMI) 40.0 and over, adult: Secondary | ICD-10-CM

## 2023-12-13 DIAGNOSIS — R7303 Prediabetes: Secondary | ICD-10-CM | POA: Diagnosis not present

## 2023-12-13 MED ORDER — WEGOVY 0.25 MG/0.5ML ~~LOC~~ SOAJ: 0.2500 mg | SUBCUTANEOUS | 0 refills | Status: AC

## 2023-12-13 NOTE — Progress Notes (Signed)
 Established Patient Visit  Patient: Kathy [name]   DOB: 1975-02-17   48 y.o. Female  MRN: [id number] Visit Date: 12/13/2023  Subjective:    Chief Complaint  Patient presents with   Follow-up    Follow up for weight management  Due for colonoscopy and Mammogram    HPI Class 3 severe obesity due to excess calories with serious comorbidity and body mass index (BMI) of 40.0 to 44.9 in adult [facility]) Exercise:2-3x/week, elliptical and calisthenic exercise. Diet: lean protein and vegetables mostly, a day. Daily Snack 2-3x/day: popcorn, cheese n crackers, carrots/celery with dressing, chips n salsa. Sparingly eats fresh fruit Sleep: 8hrs on average. Water intake: 40oz No soda or juice ALCOHOL: 4-6 mixed drinks per week Previous use of phentermine and was not able to tolerate (palpitation) Participated in healthy weight clinic x 61months:lost 20lbs (2019) stopped due to cost Participated in weight watchers program: lost 25lbs (2007) Lifestyle modifications (daily exercise, smoothis cleanse and mindlful eating) in 2021: lost 25lbs Lowest adult weight: 157lbs in her 20s Current weight is heaviest  Hx of prediabetes and hyperlipidemia Wt Readings from Last 3 Encounters:  12/13/23 246 lb 6.4 oz (111.8 kg)  11/05/23 244 lb 6.4 oz (110.9 kg)  03/18/23 248 lb 3.2 oz (112.6 kg)    He discussed use of wegovy  vs saxenda, possible side effects and contraindications. She opted to use wegovy . She is aware of possibility of high cost due to restricted insurance coverage. She agreed to nutrotionist referral. Advised to stop Alcohol consumption, maintain daily exercise-158mins weekly, and mediterranean diet. Wegovy  0.25mg  sent F/up in 1months  Prediabetes Repeat hgbA1c at 5.9%  Elevated LDL cholesterol level It is important to maintain a mediterranean diet, daily exercise, avoid tobacco and ALCOHOL use; to prevent development of heart disease and fatty liver.    Reviewed medical, surgical, and social history today  Medications: Outpatient Medications Prior to Visit  Medication Sig   Aspirin-Acetaminophen -Caffeine (EXCEDRIN PO) Take by mouth. (Patient taking differently: Take by mouth as needed.)   ibuprofen  (ADVIL ) 600 MG tablet Take 1 tablet (600 mg total) by mouth every 6 (six) hours as needed.   TURMERIC PO turmeric   valACYclovir  (VALTREX ) 1000 MG tablet TAKE 1 TABLET(1000 MG) BY MOUTH TWICE DAILY FOR 3 TO 5 DAYS   No facility-administered medications prior to visit.   Reviewed past medical and social history.   ROS per HPI above  Last metabolic panel Lab Results  Component Value Date   GLUCOSE 89 11/05/2023   NA 135 11/05/2023   K 3.9 11/05/2023   CL 101 11/05/2023   CO2 25 11/05/2023   BUN 14 11/05/2023   CREATININE 0.90 11/05/2023   GFR 76.95 11/05/2023   CALCIUM 9.2 11/05/2023   PROT 7.5 11/05/2023   ALBUMIN 4.6 11/05/2023   LABGLOB 2.3 07/05/2018   AGRATIO 1.9 07/05/2018   BILITOT 0.5 11/05/2023   ALKPHOS 34 (L) 11/05/2023   AST 20 11/05/2023   ALT 19 11/05/2023   Last lipids Lab Results  Component Value Date   CHOL 174 11/05/2023   HDL 49.10 11/05/2023   LDLCALC 107 (H) 11/05/2023   TRIG 90.0 11/05/2023   CHOLHDL 4 11/05/2023   Last hemoglobin A1c Lab Results  Component Value Date   HGBA1C 5.9 11/05/2023   Last thyroid  functions Lab Results  Component Value Date   TSH 2.58 12/19/2020   T3TOTAL 103 07/05/2018   T4TOTAL  7.4 12/13/2018   FREET4 1.00 07/05/2018   Last vitamin D  Lab Results  Component Value Date   VD25OH 45.97 12/30/2021      Objective:  BP 122/78 (BP Location: Left Arm, Patient Position: Sitting, Cuff Size: Large)   Pulse 75   Temp 98.3 F (36.8 C) (Oral)   Ht 5' 5 (1.651 m)   Wt 246 lb 6.4 oz (111.8 kg)   LMP 11/27/2023   SpO2 98%   BMI 41.00 kg/m      Physical Exam Vitals and nursing note reviewed.  Constitutional:      Appearance: She is obese.  Cardiovascular:      Rate and Rhythm: Normal rate and regular rhythm.     Pulses: Normal pulses.     Heart sounds: Normal heart sounds.  Pulmonary:     Effort: Pulmonary effort is normal.     Breath sounds: Normal breath sounds.  Neurological:     Mental Status: She is alert and oriented to person, place, and time.     No results found for any visits on 12/13/23.    Assessment & Plan:    Problem List Items Addressed This Visit     Class 3 severe obesity due to excess calories with serious comorbidity and body mass index (BMI) of 40.0 to 44.9 in adult [facility]) - Primary   Exercise:2-3x/week, elliptical and calisthenic exercise. Diet: lean protein and vegetables mostly, a day. Daily Snack 2-3x/day: popcorn, cheese n crackers, carrots/celery with dressing, chips n salsa. Sparingly eats fresh fruit Sleep: 8hrs on average. Water intake: 40oz No soda or juice ALCOHOL: 4-6 mixed drinks per week Previous use of phentermine and was not able to tolerate (palpitation) Participated in healthy weight clinic x 31months:lost 20lbs (2019) stopped due to cost Participated in weight watchers program: lost 25lbs (2007) Lifestyle modifications (daily exercise, smoothis cleanse and mindlful eating) in 2021: lost 25lbs Lowest adult weight: 157lbs in her 20s Current weight is heaviest  Hx of prediabetes and hyperlipidemia Wt Readings from Last 3 Encounters:  12/13/23 246 lb 6.4 oz (111.8 kg)  11/05/23 244 lb 6.4 oz (110.9 kg)  03/18/23 248 lb 3.2 oz (112.6 kg)    He discussed use of wegovy  vs saxenda, possible side effects and contraindications. She opted to use wegovy . She is aware of possibility of high cost due to restricted insurance coverage. She agreed to nutrotionist referral. Advised to stop Alcohol consumption, maintain daily exercise-180mins weekly, and mediterranean diet. Wegovy  0.25mg  sent F/up in 1months      Relevant Medications   semaglutide -weight management (WEGOVY ) 0.25 MG/0.5ML SOAJ  SQ injection   Elevated LDL cholesterol level   It is important to maintain a mediterranean diet, daily exercise, avoid tobacco and ALCOHOL use; to prevent development of heart disease and fatty liver.       Relevant Medications   semaglutide -weight management (WEGOVY ) 0.25 MG/0.5ML SOAJ SQ injection   Other Relevant Orders   Referral to Nutrition and Diabetes Services   Prediabetes   Repeat hgbA1c at 5.9%      Relevant Medications   semaglutide -weight management (WEGOVY ) 0.25 MG/0.5ML SOAJ SQ injection   Other Relevant Orders   Referral to Nutrition and Diabetes Services   Return in about 4 weeks (around 01/10/2024) for Weight management.     Roselie Mood, NP

## 2023-12-13 NOTE — Assessment & Plan Note (Addendum)
 Exercise:2-3x/week, elliptical and calisthenic exercise. Diet: lean protein and vegetables mostly, a day. Daily Snack 2-3x/day: popcorn, cheese n crackers, carrots/celery with dressing, chips n salsa. Sparingly eats fresh fruit Sleep: 8hrs on average. Water intake: 40oz No soda or juice ALCOHOL: 4-6 mixed drinks per week Previous use of phentermine and was not able to tolerate (palpitation) Participated in healthy weight clinic x 69months:lost 20lbs (2019) stopped due to cost Participated in weight watchers program: lost 25lbs (2007) Lifestyle modifications (daily exercise, smoothis cleanse and mindlful eating) in 2021: lost 25lbs Lowest adult weight: 157lbs in her 20s Current weight is heaviest  Hx of prediabetes and hyperlipidemia Wt Readings from Last 3 Encounters:  12/13/23 246 lb 6.4 oz (111.8 kg)  11/05/23 244 lb 6.4 oz (110.9 kg)  03/18/23 248 lb 3.2 oz (112.6 kg)    He discussed use of wegovy  vs saxenda, possible side effects and contraindications. She opted to use wegovy . She is aware of possibility of high cost due to restricted insurance coverage. She agreed to nutrotionist referral. Advised to stop Alcohol consumption, maintain daily exercise-153mins weekly, and mediterranean diet. Wegovy  0.25mg  sent F/up in 1months

## 2023-12-13 NOTE — Assessment & Plan Note (Signed)
 Repeat hgbA1c at 5.9%

## 2023-12-13 NOTE — Telephone Encounter (Addendum)
 Received fax from patient's pharmacy about Wegovy  0.25 mg/0.5 mL.  Message states:-The patient's plan does not cover the prescribed drug without a prior authorization. Please contact the plan at [phone] to initiate a prior authorization or fax a change to the medication to pharmacy. Patient ID# [id number] M

## 2023-12-13 NOTE — Assessment & Plan Note (Signed)
 It is important to maintain a mediterranean diet, daily exercise, avoid tobacco and ALCOHOL use; to prevent development of heart disease and fatty liver.

## 2023-12-13 NOTE — Patient Instructions (Signed)
 Mediterranean Diet A Mediterranean diet is based on the traditions of countries on the [employer]. It focuses on eating more: Fruits and vegetables. Whole grains, beans, nuts, and seeds. Heart-healthy fats. These are fats that are good for your heart. It involves eating less: Dairy. Meat and eggs. Processed foods with added sugar, salt, and fat. This type of diet can help prevent certain conditions. It can also improve outcomes if you have a long-term (chronic) disease, such as kidney or heart disease. What are tips for following this plan? Reading food labels Check packaged foods for: The serving size. For foods such as rice and pasta, the serving size is the amount of cooked product, not dry. The total fat. Avoid foods with saturated fat or trans fat. Added sugars, such as corn syrup. Shopping  Try to have a balanced diet. Buy a variety of foods, such as: Fresh fruits and vegetables. You may be able to get these from local farmers markets. You can also buy them frozen. Grains, beans, nuts, and seeds. Some of these can be bought in bulk. Fresh seafood. Poultry and eggs. Low-fat dairy products. Buy whole ingredients instead of foods that have already been packaged. If you can't get fresh seafood, buy precooked frozen shrimp or canned fish, such as tuna, salmon, or sardines. Stock your pantry so you always have certain foods on hand, such as olive oil, canned tuna, canned tomatoes, rice, pasta, and beans. Cooking Cook foods with extra-virgin olive oil instead of using butter or other vegetable oils. Have meat as a side dish. Have vegetables or grains as your main dish. This means having meat in small portions or adding small amounts of meat to foods like pasta or stew. Use beans or vegetables instead of meat in common dishes like chili or lasagna. Try out different cooking methods. Try roasting, broiling, steaming, and sauting vegetables. Add frozen vegetables to soups, stews,  pasta, or rice. Add nuts or seeds for added healthy fats and plant protein at each meal. You can add these to yogurt, salads, or vegetable dishes. Marinate fish or vegetables using olive oil, lemon juice, garlic, and fresh herbs. Meal planning Plan to eat a vegetarian meal one day each week. Try to work up to two vegetarian meals, if possible. Eat seafood two or more times a week. Have healthy snacks on hand. These may include: Vegetable sticks with hummus. Greek yogurt. Fruit and nut trail mix. Eat balanced meals. These should include: Fruit: 2-3 servings a day. Vegetables: 4-5 servings a day. Low-fat dairy: 2 servings a day. Fish, poultry, or lean meat: 1 serving a day. Beans and legumes: 2 or more servings a week. Nuts and seeds: 1-2 servings a day. Whole grains: 6-8 servings a day. Extra-virgin olive oil: 3-4 servings a day. Limit red meat and sweets to just a few servings a month. Lifestyle  Try to cook and eat meals with your family. Drink enough fluid to keep your pee (urine) pale yellow. Be active every day. This includes: Aerobic exercise, which is exercise that causes your heart to beat faster. Examples include running and swimming. Leisure activities like gardening, walking, or housework. Get 7-8 hours of sleep each night. Drink red wine if your provider says you can. A glass of wine is 5 oz (150 mL). You may be allowed to have: Up to 1 glass a day if you're female and not pregnant. Up to 2 glasses a day if you're female. What foods should I eat? Fruits Apples. Apricots. Avocado.  Berries. Bananas. Cherries. Dates. Figs. Grapes. Lemons. Melon. Oranges. Peaches. Plums. Pomegranate. Vegetables Artichokes. Beets. Broccoli. Cabbage. Carrots. Eggplant. Green beans. Chard. Kale. Spinach. Onions. Leeks. Peas. Squash. Tomatoes. Peppers. Radishes. Grains Whole-grain pasta. Brown rice. Bulgur wheat. Polenta. Couscous. Whole-wheat bread. [place] Madeira. Meats and other  proteins Beans. Almonds. Sunflower seeds. Pine nuts. Peanuts. Cod. Salmon. Scallops. Shrimp. Tuna. Tilapia. Clams. Oysters. Eggs. Chicken or turkey without skin. Dairy Low-fat milk. Cheese. Greek yogurt. Fats and oils Extra-virgin olive oil. Avocado oil. Grapeseed oil. Beverages Water. Red wine. Herbal tea. Sweets and desserts Greek yogurt with honey. Baked apples. Poached pears. Trail mix. Seasonings and condiments Basil. Cilantro. Coriander. Cumin. Mint. Parsley. Sage. Rosemary. Tarragon. Garlic. Oregano. Thyme. Pepper. Balsamic vinegar. Tahini. Hummus. Tomato sauce. Olives. Mushrooms. The items listed above may not be all the foods and drinks you can have. Talk to a dietitian to learn more. What foods should I limit? This is a list of foods that should be eaten rarely. Fruits Fruit canned in syrup. Vegetables Deep-fried potatoes, like French fries. Grains Packaged pasta or rice dishes. Cereal with added sugar. Snacks with added sugar. Meats and other proteins Beef. Pork. Lamb. Chicken or turkey with skin. Hot dogs. [place]. Dairy Ice cream. Sour cream. Whole milk. Fats and oils Butter. Canola oil. Vegetable oil. Beef fat (tallow). Lard. Beverages Juice. Sugar-sweetened soft drinks. Beer. Liquor and spirits. Sweets and desserts Cookies. Cakes. Pies. Candy. Seasonings and condiments Mayonnaise. Pre-made sauces and marinades. The items listed above may not be all the foods and drinks you should limit. Talk to a dietitian to learn more. Where to find more information American Heart Association (AHA): heart.org This information is not intended to replace advice given to you by your health care provider. Make sure you discuss any questions you have with your health care provider. Document Revised: 05/03/2022 Document Reviewed: 05/03/2022 Elsevier Patient Education  2024 Elsevier Inc.  How to Increase Your Level of Physical Activity Getting regular physical activity is important  for your overall health and well-being. Most people do not get enough exercise. There are easy ways to increase your level of physical activity, even if you have not been very active in the past or if you are just starting out. What are the benefits of physical activity? Physical activity has many short-term and long-term benefits. Being active on a regular basis can improve your physical and mental health as well as provide other benefits. Physical health benefits Helping you lose weight or maintain a healthy weight. Strengthening your muscles and bones. Reducing your risk of certain long-term (chronic) diseases, including heart disease, cancer, and diabetes. Being able to move around more easily and for longer periods of time without getting tired (increased endurance or stamina). Improving your ability to fight off illness (enhanced immunity). Being able to sleep better. Helping you stay healthy as you get older, including: Helping you stay mobile, or capable of walking and moving around. Preventing accidents, such as falls. Increasing life expectancy. Mental health benefits Boosting your mood and improving your self-esteem. Lowering your chance of having mental health problems, such as depression or anxiety. Helping you feel good about your body. Other benefits Finding new sources of fun and enjoyment. Meeting new people who share a common interest. Before you begin If you have a chronic illness or have not been active for a while, check with your health care provider about how to get started. Ask your health care provider what activities are safe for you. Start out slowly. Walking or  doing some simple chair exercises is a good place to start, especially if you have not been active before or for a long time. Set goals that you can work toward. Ask your health care provider how much exercise is best for you. In general, most adults should: Do moderate-intensity exercise for at least 150  minutes each week (30 minutes on most days of the week) or vigorous exercise for at least 75 minutes each week, or a combination of these. Moderate-intensity exercise can include walking at a quick pace, biking, yoga, water aerobics, or gardening. Vigorous exercise involves activities that take more effort, such as jogging or running, playing sports, swimming laps, or jumping rope. Do strength exercises on at least 2 days each week. This can include weight lifting, body weight exercises, and resistance-band exercises. How to be more physically active Make a plan  Try to find activities that you enjoy. You are more likely to commit to an exercise routine if it does not feel like a chore. If you have bone or joint problems, choose low-impact exercises, like walking or swimming. Use these tips for being successful with an exercise plan: Find a workout partner for accountability. Join a group or class, such as an aerobics class, cycling class, or sports team. Make family time active. Go for a walk, bike, or swim. Include a variety of exercises each week. Consider using a fitness tracker, such as a mobile phone app or a device worn like a watch, that will count the number of steps you take each day. Many people strive to reach 10,000 steps a day. Find ways to be active in your daily routines Besides your formal exercise plans, you can find ways to do physical activity during your daily routines, such as: Walking or biking to work or to the store. Taking the stairs instead of the elevator. Parking farther away from the door at work or at the store. Planning walking meetings. Walking around while you are on the phone. Where to find more information Centers for Disease Control and Prevention: campuscasting.com.pt President's Council on Fitness, Sports & Nutrition: www.fitness.gov ChooseMyPlate: http://www.harvey.com/ Contact a health care provider if: You have headaches, muscle aches, or joint  pain that is concerning. You feel dizzy or light-headed while exercising. You faint. You feel your heart skipping, racing, or fluttering. You have chest pain while exercising. Summary Exercise benefits your mind and body at any age, even if you are just starting out. If you have a chronic illness or have not been active for a while, check with your health care provider before increasing your physical activity. Choose activities that are safe and enjoyable for you. Ask your health care provider what activities are safe for you. Start slowly. Tell your health care provider if you have problems as you start to increase your activity level. This information is not intended to replace advice given to you by your health care provider. Make sure you discuss any questions you have with your health care provider. Document Revised: 05/17/2020 Document Reviewed: 05/17/2020 Elsevier Patient Education  2024 [employer].

## 2023-12-14 ENCOUNTER — Telehealth: Payer: Self-pay

## 2023-12-14 ENCOUNTER — Other Ambulatory Visit (HOSPITAL_COMMUNITY): Payer: Self-pay

## 2023-12-14 NOTE — Telephone Encounter (Signed)
 Pharmacy Patient Advocate Encounter  Received notification from [facility] that Prior Authorization for Wegovy  0.25MG /0.5ML auto-injectors has been APPROVED  to 6.9.26. Ran test claim, Copay is $0.00. This test claim was processed through [facility]- copay amounts may vary at other pharmacies due to pharmacy/plan contracts, or as the patient moves through the different stages of their insurance plan.   PA #/Case ID/Reference #: [id number]

## 2023-12-14 NOTE — Telephone Encounter (Signed)
 Pharmacy Patient Advocate Encounter   Received notification from Pt Calls Messages that prior authorization for WEGOVY  0.25MG /0.5ML is required/requested.   Insurance verification completed.   The patient is insured through [facility]   Per test claim: PA required; PA submitted to above mentioned insurance via Latent Key/confirmation #/EOC [facility] Status is pending

## 2023-12-15 NOTE — Telephone Encounter (Signed)
 Contacted patient and informed her that her wegovy  is approved. Patient expressed understanding. Nothing further needed.

## 2023-12-23 NOTE — Telephone Encounter (Signed)
 Email sent to clinical IT to inquire about [id number].

## 2024-01-05 DIAGNOSIS — Z1231 Encounter for screening mammogram for malignant neoplasm of breast: Secondary | ICD-10-CM | POA: Diagnosis not present

## 2024-01-05 DIAGNOSIS — Z1331 Encounter for screening for depression: Secondary | ICD-10-CM | POA: Diagnosis not present

## 2024-01-05 DIAGNOSIS — Z01411 Encounter for gynecological examination (general) (routine) with abnormal findings: Secondary | ICD-10-CM | POA: Diagnosis not present

## 2024-01-05 DIAGNOSIS — Z1159 Encounter for screening for other viral diseases: Secondary | ICD-10-CM | POA: Diagnosis not present

## 2024-01-05 DIAGNOSIS — F418 Other specified anxiety disorders: Secondary | ICD-10-CM | POA: Diagnosis not present

## 2024-01-05 DIAGNOSIS — B3731 Acute candidiasis of vulva and vagina: Secondary | ICD-10-CM | POA: Diagnosis not present

## 2024-01-05 DIAGNOSIS — Z113 Encounter for screening for infections with a predominantly sexual mode of transmission: Secondary | ICD-10-CM | POA: Diagnosis not present

## 2024-01-05 DIAGNOSIS — Z3041 Encounter for surveillance of contraceptive pills: Secondary | ICD-10-CM | POA: Diagnosis not present

## 2024-01-05 DIAGNOSIS — Z118 Encounter for screening for other infectious and parasitic diseases: Secondary | ICD-10-CM | POA: Diagnosis not present

## 2024-01-05 DIAGNOSIS — Z114 Encounter for screening for human immunodeficiency virus [HIV]: Secondary | ICD-10-CM | POA: Diagnosis not present

## 2024-01-11 ENCOUNTER — Ambulatory Visit: Admitting: Nurse Practitioner

## 2024-01-12 ENCOUNTER — Other Ambulatory Visit: Payer: Self-pay | Admitting: Nurse Practitioner

## 2024-01-12 DIAGNOSIS — E7800 Pure hypercholesterolemia, unspecified: Secondary | ICD-10-CM

## 2024-01-12 DIAGNOSIS — E66813 Obesity, class 3: Secondary | ICD-10-CM

## 2024-01-12 DIAGNOSIS — R7303 Prediabetes: Secondary | ICD-10-CM

## 2024-01-13 NOTE — Telephone Encounter (Signed)
 Determined that Epic LAB# 494 is the order for this particular test.

## 2024-01-14 NOTE — Telephone Encounter (Signed)
 Called and left a voice message for the patient per DPR on file asking to give me a call back at the office.

## 2024-01-14 NOTE — Telephone Encounter (Signed)
 Per discussion with Roselie Mood, NP she stated to call patient to ask how she is handling the current dose of the medication. If patient is doing well then to send in increased dose of Wegovy  0.5 mg weekly as 2 mL with 1 and if patient is not tolerating well continue current dose and send in previous refill

## 2024-01-16 ENCOUNTER — Other Ambulatory Visit: Payer: Self-pay | Admitting: Family

## 2024-01-16 MED ORDER — WEGOVY 0.5 MG/0.5ML ~~LOC~~ SOAJ: 0.5000 mg | SUBCUTANEOUS | 1 refills | Status: AC

## 2024-02-01 ENCOUNTER — Encounter: Payer: Self-pay | Admitting: Dietician

## 2024-02-01 ENCOUNTER — Encounter: Admitting: Dietician

## 2024-02-01 VITALS — Wt 246.7 lb

## 2024-02-01 DIAGNOSIS — R7303 Prediabetes: Secondary | ICD-10-CM | POA: Diagnosis present

## 2024-02-01 NOTE — Progress Notes (Signed)
 Medical Nutrition Therapy  Appointment Start time:  1550  Appointment End time:  1645  Primary concerns today: preventing diabetes   Referral diagnosis: prediabetes Preferred learning style: no preference indicated Learning readiness: ready   NUTRITION ASSESSMENT   Anthropometrics   Wt 02/01/24: 246.7 lb   Clinical Medical Hx: reviewed; anxiety, arthritis, HLD, prediabetes Medications: reviewed; semaglutide  (hasn't started taking) Labs: reviewed; 11/05/23: A1c 5.9%, LDL 107 Notable Signs/Symptoms: none reported Food Allergies: avocado (throws up)  Lifestyle & Dietary Hx  Pt present today with her daughter Talbert (10 years old). Pt reports she has another daughter who is 75 years old but does not live with her and Lilly.   Pt reports all of her grandparents had/have diabetes. Pt reports she has 1 living grandparent. Pt reports she wants to prevent diabetes.   Pt reports she has done weight watchers and healthy weight and wellness programs in the past.   Pt states she was prescribed wegovy  and was suppose to start 1 month ago but went on vacation in [place] and was hesitant to start due to being worried about side effects while on vacation.   Pt reports she typically has breakfast, lunch, and dinner, and may occasionally snack. Pt reports she eats out 2-3 nights per week. Pt reports if she does not pack lunch she will pick up lunch.   Pt states she goes walking 2 times per week for 30 minutes and to the gym once a week.   Estimated daily fluid intake: 80 oz Supplements: turmeric Sleep: 7 hours Stress / self-care: 8 out of 10, lower right now (just got back from vacation) Current average weekly physical activity: 2-3 times per week, 30 minute walk or gym.   24-Hr Dietary Recall First Meal: eggs and 1 piece toast OR spinach feta wrap  Snack: none Second Meal: chopt OR cava OR packs  Snack: none Third Meal: chicken/fish and rice and veggies OR spaghetti OR wingstop OR  pho Snack: chips and salsa OR carrots/celery OR popcorn Beverages: coffee and half and half and agave, seltzer water, unsweet tea, water   NUTRITION DIAGNOSIS  NB-1.1 Food and nutrition-related knowledge deficit As related to lack of prior education by a registered dietitian.  As evidenced by pt report.   NUTRITION INTERVENTION  Nutrition education (E-1) on the following topics:   Prediabetes Prediabetes: Prediabetes is a condition where blood sugar levels are higher than normal but not yet high enough to be diagnosed as type 2 diabetes. A1C, or hemoglobin A1c, is a blood test that provides an average of a person's blood sugar levels over the past two to three months. It is commonly used to diagnose and monitor diabetes. For prediabetes, an A1C level between 5.7% and 6.4% typically is used to diagnose this. Here is how the A1C levels are generally categorized: Normal:  A1C below 5.7% Prediabetes:  A1C between 5.7% and 6.4% Diabetes:  A1C of 6.5% or higher When diagnosed with prediabetes, there are several lifestyle changes you can make to manage the condition: Healthy Eating:  Follow a well-balanced diet that includes a variety of fruits, vegetables, whole grains, lean proteins, and healthy fats. Monitor portion sizes and reduce intake of sugary and processed foods. Regular Physical Activity:  Engage in regular physical activity, such as brisk walking, cycling, or other aerobic exercises, for at least 150 minutes per week. Include strength training exercises at least twice a week. Weight Management: Achieve and maintain a healthy weight. Losing even a small amount of weight (  3-5%) can significantly improve insulin  sensitivity.  Why you need complex carbohydrates: Whole grains and other complex carbohydrates are required to have a healthy diet. Whole grains provide fiber which can help with blood glucose levels and help keep you satiated. Fruits and starchy vegetables provide essential  vitamins and minerals required for immune function, eyesight support, brain support, bone density, wound healing and many other functions within the body. According to the current evidenced based 2020-2025 Dietary Guidelines for Americans, complex carbohydrates are part of a healthy eating pattern which is associated with a decreased risk for type 2 diabetes, cancers, and cardiovascular disease.   Plate Method Fruits & Vegetables: Aim to fill half your plate with a variety of fruits and vegetables. They are rich in vitamins, minerals, and fiber, and can help reduce the risk of chronic diseases. Choose a colorful assortment of fruits and vegetables to ensure you get a wide range of nutrients. Grains and Starches: Make at least half of your grain choices whole grains, such as brown rice, whole wheat bread, and oats. Whole grains provide fiber, which aids in digestion and healthy cholesterol levels. Aim for whole forms of starchy vegetables such as potatoes, sweet potatoes, beans, peas, and corn, which are fiber rich and provide many vitamins and minerals.  Protein: Incorporate lean sources of protein, such as poultry, fish, beans, nuts, and seeds, into your meals. Protein is essential for building and repairing tissues, staying full, balancing blood sugar, as well as supporting immune function. Dairy: Include low-fat or fat-free dairy products like milk, yogurt, and cheese in your diet. Dairy foods are excellent sources of calcium and vitamin D , which are crucial for bone health.    Handouts Provided Include  Plate Method Balanced Snacks Micronutrients and Macronutrients  Learning Style & Readiness for Change Teaching method utilized: Visual & Auditory  Demonstrated degree of understanding via: Teach Back  Barriers to learning/adherence to lifestyle change: none  Goals Established by Pt  Goal 1: exercise 4 days a week for at least 30 minutes.  Goal 2: when snacking, aim to include a protein and  complex carb.    MONITORING & EVALUATION Dietary intake, weekly physical activity, and follow up in 6-8 weeks.  Next Steps  Patient is to call for questions.

## 2024-03-17 ENCOUNTER — Ambulatory Visit: Admitting: Nurse Practitioner

## 2024-03-30 ENCOUNTER — Encounter: Admitting: Dietician

## 2024-05-19 ENCOUNTER — Ambulatory Visit: Admitting: Nurse Practitioner

## 2024-05-31 ENCOUNTER — Ambulatory Visit: Admitting: Dermatology
